# Patient Record
Sex: Male | Born: 1955 | Race: White | Hispanic: No | Marital: Married | Smoking: Former smoker
Health system: Southern US, Community
[De-identification: ages and names within clinical notes are randomized; demographics above are authoritative.]

## PROBLEM LIST (undated history)

## (undated) DIAGNOSIS — E119 Type 2 diabetes mellitus without complications: Secondary | ICD-10-CM

## (undated) DIAGNOSIS — C8190 Hodgkin lymphoma, unspecified, unspecified site: Secondary | ICD-10-CM

## (undated) DIAGNOSIS — M1990 Unspecified osteoarthritis, unspecified site: Secondary | ICD-10-CM

## (undated) DIAGNOSIS — Z87442 Personal history of urinary calculi: Secondary | ICD-10-CM

## (undated) DIAGNOSIS — K581 Irritable bowel syndrome with constipation: Secondary | ICD-10-CM

## (undated) DIAGNOSIS — E785 Hyperlipidemia, unspecified: Secondary | ICD-10-CM

## (undated) DIAGNOSIS — G629 Polyneuropathy, unspecified: Secondary | ICD-10-CM

## (undated) DIAGNOSIS — Z5111 Encounter for antineoplastic chemotherapy: Secondary | ICD-10-CM

## (undated) DIAGNOSIS — J479 Bronchiectasis, uncomplicated: Secondary | ICD-10-CM

## (undated) DIAGNOSIS — R0609 Other forms of dyspnea: Secondary | ICD-10-CM

## (undated) DIAGNOSIS — E669 Obesity, unspecified: Secondary | ICD-10-CM

## (undated) DIAGNOSIS — G4733 Obstructive sleep apnea (adult) (pediatric): Secondary | ICD-10-CM

## (undated) DIAGNOSIS — N400 Enlarged prostate without lower urinary tract symptoms: Secondary | ICD-10-CM

## (undated) DIAGNOSIS — J45909 Unspecified asthma, uncomplicated: Secondary | ICD-10-CM

## (undated) DIAGNOSIS — J3800 Paralysis of vocal cords and larynx, unspecified: Secondary | ICD-10-CM

## (undated) DIAGNOSIS — I10 Essential (primary) hypertension: Secondary | ICD-10-CM

## (undated) DIAGNOSIS — J449 Chronic obstructive pulmonary disease, unspecified: Secondary | ICD-10-CM

## (undated) DIAGNOSIS — R590 Localized enlarged lymph nodes: Secondary | ICD-10-CM

## (undated) DIAGNOSIS — G4730 Sleep apnea, unspecified: Secondary | ICD-10-CM

## (undated) DIAGNOSIS — R0600 Dyspnea, unspecified: Secondary | ICD-10-CM

## (undated) HISTORY — PX: ORCHIECTOMY: SHX2116

## (undated) HISTORY — PX: JOINT REPLACEMENT: SHX530

## (undated) HISTORY — PX: FRACTURE SURGERY: SHX138

## (undated) HISTORY — PX: COLONOSCOPY: SHX174

## (undated) HISTORY — PX: EYE SURGERY: SHX253

## (undated) HISTORY — PX: WRIST SURGERY: SHX841

## (undated) HISTORY — PX: OTHER SURGICAL HISTORY: SHX169

## (undated) HISTORY — PX: TOTAL HIP ARTHROPLASTY: SHX124

---

## 1997-04-03 HISTORY — PX: TRACHEOSTOMY: SUR1362

## 1999-04-04 DIAGNOSIS — C439 Malignant melanoma of skin, unspecified: Secondary | ICD-10-CM

## 1999-04-04 HISTORY — DX: Malignant melanoma of skin, unspecified: C43.9

## 2015-04-04 HISTORY — PX: OTHER SURGICAL HISTORY: SHX169

## 2019-01-30 DIAGNOSIS — C4491 Basal cell carcinoma of skin, unspecified: Secondary | ICD-10-CM

## 2019-01-30 HISTORY — DX: Basal cell carcinoma of skin, unspecified: C44.91

## 2019-07-31 ENCOUNTER — Other Ambulatory Visit: Payer: Self-pay

## 2019-07-31 ENCOUNTER — Ambulatory Visit (INDEPENDENT_AMBULATORY_CARE_PROVIDER_SITE_OTHER): Payer: Medicare Other | Admitting: Dermatology

## 2019-07-31 DIAGNOSIS — L820 Inflamed seborrheic keratosis: Secondary | ICD-10-CM | POA: Diagnosis not present

## 2019-07-31 DIAGNOSIS — D485 Neoplasm of uncertain behavior of skin: Secondary | ICD-10-CM

## 2019-07-31 DIAGNOSIS — L821 Other seborrheic keratosis: Secondary | ICD-10-CM

## 2019-07-31 DIAGNOSIS — D2339 Other benign neoplasm of skin of other parts of face: Secondary | ICD-10-CM

## 2019-07-31 DIAGNOSIS — Z85828 Personal history of other malignant neoplasm of skin: Secondary | ICD-10-CM

## 2019-07-31 DIAGNOSIS — D369 Benign neoplasm, unspecified site: Secondary | ICD-10-CM

## 2019-07-31 DIAGNOSIS — Z85820 Personal history of malignant melanoma of skin: Secondary | ICD-10-CM | POA: Diagnosis not present

## 2019-07-31 DIAGNOSIS — Z1283 Encounter for screening for malignant neoplasm of skin: Secondary | ICD-10-CM

## 2019-07-31 DIAGNOSIS — D1800 Hemangioma unspecified site: Secondary | ICD-10-CM

## 2019-07-31 DIAGNOSIS — D492 Neoplasm of unspecified behavior of bone, soft tissue, and skin: Secondary | ICD-10-CM

## 2019-07-31 DIAGNOSIS — D229 Melanocytic nevi, unspecified: Secondary | ICD-10-CM

## 2019-07-31 DIAGNOSIS — L578 Other skin changes due to chronic exposure to nonionizing radiation: Secondary | ICD-10-CM

## 2019-07-31 NOTE — Patient Instructions (Signed)

## 2019-07-31 NOTE — Progress Notes (Signed)
Follow-Up Visit   Subjective  Austin Sullivan is a 64 y.o. male who presents for the following: Annual Exam (TBSE, 6 months f/u hx of Melanoma, Hx of BCC ). Pt with a hx of Melanoma on the forehead removed in 2001, then pt developed Internal Metastatic Melanoma in 2016, treating with Keytruda since January 2021 every 6 weeks. The patient presents for total-body skin exam and skin cancer screening and mole check.  The following portions of the chart were reviewed this encounter and updated as appropriate:  Allergies  Meds  Problems  Med Hx  Surg Hx  Fam Hx      Review of Systems:  No other skin or systemic complaints except as noted in HPI or Assessment and Plan.  Objective  Well appearing patient in no apparent distress; mood and affect are within normal limits.  A full examination was performed including scalp, head, eyes, ears, nose, lips, neck, chest, axillae, abdomen, back, buttocks, bilateral upper extremities, bilateral lower extremities, hands, feet, fingers, toes, fingernails, and toenails. All findings within normal limits unless otherwise noted below.  Objective  L temple near hairline: Pink patch-   Objective  Left post flank above waistline: Erythematous keratotic or waxy stuck-on papule or plaque.   Objective  L lateral brow: 0.4 cm pink irregular patch   Objective  R inferior pectoral: 0.7 brown papule    Assessment & Plan    Skin cancer screening performed today.  History of Melanoma - No evidence of recurrence today - No lymphadenopathy - Recommend regular full body skin exams - Recommend daily broad spectrum sunscreen SPF 30+ to sun-exposed areas, reapply every 2 hours as needed.  - Call if any new or changing lesions are noted between office visits  -Patient has metastatic melanoma and is currently on chemotherapy Keytruda  Seborrheic Keratoses - Stuck-on, waxy, tan-brown papules and plaques  - Discussed benign etiology and prognosis. -  Observe - Call for any changes  Actinic Damage - diffuse scaly erythematous macules with underlying dyspigmentation - Recommend daily broad spectrum sunscreen SPF 30+ to sun-exposed areas, reapply every 2 hours as needed.  - Call for new or changing lesions.  Melanocytic Nevi - Tan-brown and/or pink-flesh-colored symmetric macules and papules - Benign appearing on exam today - Observation - Call clinic for new or changing moles - Recommend daily use of broad spectrum spf 30+ sunscreen to sun-exposed areas.   History of Basal Cell Carcinoma of the Skin Left ear superior helix 01-30-2019 - No evidence of recurrence today - Recommend regular full body skin exams - Recommend daily broad spectrum sunscreen SPF 30+ to sun-exposed areas, reapply every 2 hours as needed.  - Call if any new or changing lesions are noted between office visits  Hemangiomas - Red papules - Discussed benign nature - Observe - Call for any changes   Benign neoplasm L temple near hairline Pink atrophic patch The patient will observe  Recheck in 3 months   Inflamed seborrheic keratosis Left post flank above waistline  Destruction of lesion - Left post flank above waistline Complexity: simple   Destruction method: cryotherapy   Informed consent: discussed and consent obtained   Timeout:  patient name, date of birth, surgical site, and procedure verified Lesion destroyed using liquid nitrogen: Yes   Region frozen until ice ball extended beyond lesion: Yes   Outcome: patient tolerated procedure well with no complications   Post-procedure details: wound care instructions given    Neoplasm of skin (2) L lateral  brow  Skin / nail biopsy Type of biopsy: tangential   Informed consent: discussed and consent obtained   Patient was prepped and draped in usual sterile fashion: area prepped with alochol. Anesthesia: the lesion was anesthetized in a standard fashion   Anesthetic:  1% lidocaine w/  epinephrine 1-100,000 buffered w/ 8.4% NaHCO3 Instrument used: flexible razor blade   Hemostasis achieved with: pressure, aluminum chloride and electrodesiccation   Outcome: patient tolerated procedure well   Post-procedure details: wound care instructions given   Post-procedure details comment:  Ointment and small bandage  Specimen 1 - Surgical pathology Differential Diagnosis: R/O BCC Check Margins: No 0.4 cm pink irregular patch  R inferior pectoral  Epidermal / dermal shaving  Lesion length (cm):  0.7 Lesion width (cm):  0.7 Margin per side (cm):  0.2 Total excision diameter (cm):  1.1 Informed consent: discussed and consent obtained   Timeout: patient name, date of birth, surgical site, and procedure verified   Procedure prep:  Patient was prepped and draped in usual sterile fashion Prep type:  Isopropyl alcohol Anesthesia: the lesion was anesthetized in a standard fashion   Anesthetic:  1% lidocaine w/ epinephrine 1-100,000 buffered w/ 8.4% NaHCO3 Hemostasis achieved with: pressure, aluminum chloride and electrodesiccation   Outcome: patient tolerated procedure well   Post-procedure details: sterile dressing applied and wound care instructions given   Dressing type: bandage and petrolatum    Specimen 2 - Surgical pathology Differential Diagnosis: SK vs Irritated nevus  Check Margins: No 0.7 brown papule  Return in about 3 months (around 10/30/2019) for recheck ISK, recheck L temple near hairline .  IMarye Round, CMA, am acting as scribe for [name], MD .  Documentation: I have reviewed the above documentation for accuracy and completeness, and I agree with the above.  [name], MD

## 2019-08-01 ENCOUNTER — Encounter: Payer: Self-pay | Admitting: Dermatology

## 2019-08-05 ENCOUNTER — Telehealth: Payer: Self-pay

## 2019-08-05 NOTE — Telephone Encounter (Signed)
LM on VM to return my call (pathology results) 

## 2019-08-05 NOTE — Telephone Encounter (Signed)
Patient advised of BX results. He will keep his 11/03/19 appointment for a three month follow up and to treat the place on his brow.

## 2019-08-06 ENCOUNTER — Telehealth: Payer: Self-pay

## 2019-08-06 NOTE — Telephone Encounter (Signed)
Patient already informed and scheduled for treatment

## 2019-11-03 ENCOUNTER — Ambulatory Visit (INDEPENDENT_AMBULATORY_CARE_PROVIDER_SITE_OTHER): Payer: Medicare Other | Admitting: Dermatology

## 2019-11-03 ENCOUNTER — Other Ambulatory Visit: Payer: Self-pay

## 2019-11-03 ENCOUNTER — Encounter: Payer: Self-pay | Admitting: Dermatology

## 2019-11-03 DIAGNOSIS — L820 Inflamed seborrheic keratosis: Secondary | ICD-10-CM

## 2019-11-03 DIAGNOSIS — C4491 Basal cell carcinoma of skin, unspecified: Secondary | ICD-10-CM

## 2019-11-03 DIAGNOSIS — C44319 Basal cell carcinoma of skin of other parts of face: Secondary | ICD-10-CM | POA: Diagnosis not present

## 2019-11-03 DIAGNOSIS — Z85820 Personal history of malignant melanoma of skin: Secondary | ICD-10-CM

## 2019-11-03 DIAGNOSIS — L578 Other skin changes due to chronic exposure to nonionizing radiation: Secondary | ICD-10-CM

## 2019-11-03 NOTE — Patient Instructions (Signed)

## 2019-11-03 NOTE — Progress Notes (Signed)
   Follow-Up Visit   Subjective  Austin Sullivan is a 64 y.o. male who presents for the following: Basal Cell Carcinoma (left lat brow - EDC today.).  The following portions of the chart were reviewed this encounter and updated as appropriate:  Allergies  Meds  Problems  Med Hx  Surg Hx  Fam Hx     Review of Systems:  No other skin or systemic complaints except as noted in HPI or Assessment and Plan.  Objective  Well appearing patient in no apparent distress; mood and affect are within normal limits.  A focused examination was performed including face, chest. Relevant physical exam findings are noted in the Assessment and Plan.  Objective  Left lat brow: Well healed biopsy site - undetectable today. Recheck on follow up.  Objective  Left medial infrapectoral (2): Erythematous keratotic or waxy stuck-on papule or plaque.    Assessment & Plan  Basal cell carcinoma (BCC), unspecified site Left lat brow  Appears clear today. Advised patient to RTC with any recurrence.  Inflamed seborrheic keratosis (2) Left medial infrapectoral  Destruction of lesion - Left medial infrapectoral Complexity: simple   Destruction method: cryotherapy   Informed consent: discussed and consent obtained   Timeout:  patient name, date of birth, surgical site, and procedure verified Lesion destroyed using liquid nitrogen: Yes   Region frozen until ice ball extended beyond lesion: Yes   Outcome: patient tolerated procedure well with no complications   Post-procedure details: wound care instructions given    Actinic Damage - diffuse scaly erythematous macules with underlying dyspigmentation - Recommend daily broad spectrum sunscreen SPF 30+ to sun-exposed areas, reapply every 2 hours as needed.  - Call for new or changing lesions.  History of Melanoma - metastatic - followed by Dr Harriet Masson at [facility] regular full body skin exams - Recommend daily broad spectrum sunscreen SPF 30+ to  sun-exposed areas, reapply every 2 hours as needed.  - Call if any new or changing lesions are noted between office visits  Return in about 6 months (around 05/05/2020).   I, Ashok Cordia, CMA, am acting as scribe for [name], MD .  Documentation: I have reviewed the above documentation for accuracy and completeness, and I agree with the above.  [name], MD

## 2019-11-04 ENCOUNTER — Encounter: Payer: Self-pay | Admitting: Dermatology

## 2020-04-03 HISTORY — PX: OTHER SURGICAL HISTORY: SHX169

## 2020-04-16 ENCOUNTER — Other Ambulatory Visit: Payer: Self-pay | Admitting: Orthopedic Surgery

## 2020-04-16 DIAGNOSIS — M1712 Unilateral primary osteoarthritis, left knee: Secondary | ICD-10-CM

## 2020-04-27 ENCOUNTER — Ambulatory Visit: Payer: Federal, State, Local not specified - PPO

## 2020-05-12 ENCOUNTER — Encounter: Payer: Self-pay | Admitting: Dermatology

## 2020-05-12 ENCOUNTER — Other Ambulatory Visit: Payer: Self-pay

## 2020-05-12 ENCOUNTER — Ambulatory Visit (INDEPENDENT_AMBULATORY_CARE_PROVIDER_SITE_OTHER): Payer: Medicare Other | Admitting: Dermatology

## 2020-05-12 DIAGNOSIS — Z1283 Encounter for screening for malignant neoplasm of skin: Secondary | ICD-10-CM | POA: Diagnosis not present

## 2020-05-12 DIAGNOSIS — L821 Other seborrheic keratosis: Secondary | ICD-10-CM

## 2020-05-12 DIAGNOSIS — D229 Melanocytic nevi, unspecified: Secondary | ICD-10-CM

## 2020-05-12 DIAGNOSIS — Z85820 Personal history of malignant melanoma of skin: Secondary | ICD-10-CM | POA: Diagnosis not present

## 2020-05-12 DIAGNOSIS — L814 Other melanin hyperpigmentation: Secondary | ICD-10-CM

## 2020-05-12 DIAGNOSIS — L578 Other skin changes due to chronic exposure to nonionizing radiation: Secondary | ICD-10-CM

## 2020-05-12 DIAGNOSIS — D1800 Hemangioma unspecified site: Secondary | ICD-10-CM

## 2020-05-12 DIAGNOSIS — Z85828 Personal history of other malignant neoplasm of skin: Secondary | ICD-10-CM | POA: Diagnosis not present

## 2020-05-12 NOTE — Progress Notes (Signed)
   Follow-Up Visit   Subjective  Austin Sullivan is a 65 y.o. male who presents for the following: Total body skin exam (55m f/u, hx of Melanoma, BCCs). The patient presents for Total-Body Skin Exam (TBSE) for skin cancer screening and mole check.  The following portions of the chart were reviewed this encounter and updated as appropriate:   Allergies  Meds  Problems  Med Hx  Surg Hx  Fam Hx     Review of Systems:  No other skin or systemic complaints except as noted in HPI or Assessment and Plan.  Objective  Well appearing patient in no apparent distress; mood and affect are within normal limits.  A full examination was performed including scalp, head, eyes, ears, nose, lips, neck, chest, axillae, abdomen, back, buttocks, bilateral upper extremities, bilateral lower extremities, hands, feet, fingers, toes, fingernails, and toenails. All findings within normal limits unless otherwise noted below.  Objective  R forehead: Well healed scar with no evidence of recurrence, no lymphadenopathy.   Objective  Left Ear sup helix, L lat brow: Well healed scar with no evidence of recurrence.    Assessment & Plan    Lentigines - Scattered tan macules - Discussed due to sun exposure - Benign, observe - Call for any changes  Seborrheic Keratoses - Stuck-on, waxy, tan-brown papules and plaques  - Discussed benign etiology and prognosis. - Observe - Call for any changes  Melanocytic Nevi - Tan-brown and/or pink-flesh-colored symmetric macules and papules - Benign appearing on exam today - Observation - Call clinic for new or changing moles - Recommend daily use of broad spectrum spf 30+ sunscreen to sun-exposed areas.   Hemangiomas - Red papules - Discussed benign nature - Observe - Call for any changes  Actinic Damage - Chronic, secondary to cumulative UV/sun exposure - diffuse scaly erythematous macules with underlying dyspigmentation - Recommend daily broad spectrum  sunscreen SPF 30+ to sun-exposed areas, reapply every 2 hours as needed.  - Call for new or changing lesions.  Skin cancer screening performed today.  History of melanoma R forehead Metastatic - followed by Dr Harriet Masson at [facility] and here at [facility] [place] [facility]. Hx of Chemotherapy, currently in remission No lymphadenopathy.  History of basal cell carcinoma (BCC) Left Ear sup helix, L lat brow Clear. Observe for recurrence. Call clinic for new or changing lesions.  Recommend regular skin exams, daily broad-spectrum spf 30+ sunscreen use, and photoprotection.     Skin cancer screening  Return in about 6 months (around 11/09/2020) for UBSE, Hx of Melanoma, Hx of BCC.   I, Austin Sullivan, RMA, am acting as scribe for [name], MD .  Documentation: I have reviewed the above documentation for accuracy and completeness, and I agree with the above.  [name], MD

## 2020-05-16 ENCOUNTER — Encounter: Payer: Self-pay | Admitting: Dermatology

## 2020-05-28 ENCOUNTER — Other Ambulatory Visit: Payer: Self-pay

## 2020-05-28 ENCOUNTER — Ambulatory Visit
Admission: RE | Admit: 2020-05-28 | Discharge: 2020-05-28 | Disposition: A | Discharge status: Home / Self Care | Payer: Medicare Other | Source: Ambulatory Visit | Attending: Orthopedic Surgery | Admitting: Orthopedic Surgery

## 2020-05-28 DIAGNOSIS — M1712 Unilateral primary osteoarthritis, left knee: Secondary | ICD-10-CM | POA: Diagnosis not present

## 2020-06-23 ENCOUNTER — Other Ambulatory Visit: Payer: Self-pay | Admitting: Orthopedic Surgery

## 2020-07-02 DIAGNOSIS — A419 Sepsis, unspecified organism: Secondary | ICD-10-CM

## 2020-07-02 HISTORY — DX: Sepsis, unspecified organism: A41.9

## 2020-07-09 ENCOUNTER — Encounter
Admission: RE | Admit: 2020-07-09 | Discharge: 2020-07-09 | Disposition: A | Discharge status: Home / Self Care | Payer: Medicare Other | Source: Ambulatory Visit | Attending: Orthopedic Surgery | Admitting: Orthopedic Surgery

## 2020-07-09 ENCOUNTER — Other Ambulatory Visit: Payer: Self-pay

## 2020-07-09 DIAGNOSIS — Z01818 Encounter for other preprocedural examination: Secondary | ICD-10-CM | POA: Insufficient documentation

## 2020-07-09 HISTORY — DX: Dyspnea, unspecified: R06.00

## 2020-07-09 HISTORY — DX: Chronic obstructive pulmonary disease, unspecified: J44.9

## 2020-07-09 HISTORY — DX: Sleep apnea, unspecified: G47.30

## 2020-07-09 HISTORY — DX: Essential (primary) hypertension: I10

## 2020-07-09 HISTORY — DX: Type 2 diabetes mellitus without complications: E11.9

## 2020-07-09 HISTORY — DX: Unspecified osteoarthritis, unspecified site: M19.90

## 2020-07-09 LAB — URINALYSIS, ROUTINE W REFLEX MICROSCOPIC
Bacteria, UA: NONE SEEN
Bilirubin Urine: NEGATIVE
Glucose, UA: NEGATIVE mg/dL
Ketones, ur: NEGATIVE mg/dL
Nitrite: NEGATIVE
Protein, ur: 100 mg/dL — AB
RBC / HPF: >50 RBC/hpf — ABNORMAL HIGH (ref 0–5)
Specific Gravity, Urine: 1.019 (ref 1.005–1.030)
Squamous Epithelial / HPF: NONE SEEN (ref 0–5)
WBC, UA: >50 WBC/hpf — ABNORMAL HIGH (ref 0–5)
pH: 5 (ref 5.0–8.0)

## 2020-07-09 LAB — CBC WITH DIFFERENTIAL/PLATELET
Abs Immature Granulocytes: 0.04 10*3/uL (ref 0.00–0.07)
Basophils Absolute: 0.1 10*3/uL (ref 0.0–0.1)
Basophils Relative: 1 %
Eosinophils Absolute: 0.2 10*3/uL (ref 0.0–0.5)
Eosinophils Relative: 3 %
HCT: 39.4 % (ref 39.0–52.0)
Hemoglobin: 12.7 g/dL — ABNORMAL LOW (ref 13.0–17.0)
Immature Granulocytes: 1 %
Lymphocytes Relative: 8 %
Lymphs Abs: 0.6 10*3/uL — ABNORMAL LOW (ref 0.7–4.0)
MCH: 26.7 pg (ref 26.0–34.0)
MCHC: 32.2 g/dL (ref 30.0–36.0)
MCV: 82.8 fL (ref 80.0–100.0)
Monocytes Absolute: 0.5 10*3/uL (ref 0.1–1.0)
Monocytes Relative: 8 %
Neutro Abs: 5.7 10*3/uL (ref 1.7–7.7)
Neutrophils Relative %: 79 %
Platelets: 278 10*3/uL (ref 150–400)
RBC: 4.76 MIL/uL (ref 4.22–5.81)
RDW: 17.8 % — ABNORMAL HIGH (ref 11.5–15.5)
WBC: 7.1 10*3/uL (ref 4.0–10.5)
nRBC: 0 % (ref 0.0–0.2)

## 2020-07-09 LAB — COMPREHENSIVE METABOLIC PANEL
ALT: 50 U/L — ABNORMAL HIGH (ref 0–44)
AST: 37 U/L (ref 15–41)
Albumin: 4.2 g/dL (ref 3.5–5.0)
Alkaline Phosphatase: 63 U/L (ref 38–126)
Anion gap: 10 (ref 5–15)
BUN: 26 mg/dL — ABNORMAL HIGH (ref 8–23)
CO2: 27 mmol/L (ref 22–32)
Calcium: 9.5 mg/dL (ref 8.9–10.3)
Chloride: 100 mmol/L (ref 98–111)
Creatinine, Ser: 1.34 mg/dL — ABNORMAL HIGH (ref 0.61–1.24)
GFR, Estimated: 58 mL/min — ABNORMAL LOW (ref >60)
Glucose, Bld: 149 mg/dL — ABNORMAL HIGH (ref 70–99)
Potassium: 3.7 mmol/L (ref 3.5–5.1)
Sodium: 137 mmol/L (ref 135–145)
Total Bilirubin: 0.6 mg/dL (ref 0.3–1.2)
Total Protein: 7.3 g/dL (ref 6.5–8.1)

## 2020-07-09 LAB — SURGICAL PCR SCREEN
MRSA, PCR: NEGATIVE
Staphylococcus aureus: NEGATIVE

## 2020-07-09 LAB — TYPE AND SCREEN
ABO/RH(D): O NEG
Antibody Screen: NEGATIVE

## 2020-07-09 NOTE — Patient Instructions (Addendum)
Your procedure is scheduled on: 07/20/20- Tuesday Report to the Registration Desk on the 1st floor of the [facility]. To find out your arrival time, please call [phone] between 1PM - 3PM on: 07/19/20- Monday Report to [facility] for Covid testing on 07/16/20 between 8 am - 2 pm.  REMEMBER: Instructions that are not followed completely may result in serious medical risk, up to and including death; or upon the discretion of your surgeon and anesthesiologist your surgery may need to be rescheduled.  Do not eat food after midnight the night before surgery.  No gum chewing, lozengers or hard candies.  You may however, drink CLEAR liquids up to 2 hours before you are scheduled to arrive for your surgery. Do not drink anything within 2 hours of your scheduled arrival time.  Type 1 and Type 2 diabetics should only drink water.  TAKE THESE MEDICATIONS THE MORNING OF SURGERY WITH A SIP OF WATER:  - alfuzosin (UROXATRAL) 10 MG 24 hr tablet - Azelastine HCl 137 MCG/SPRAY SOLN - cycloSPORINE (RESTASIS) 0.05 % ophthalmic emulsion - fluticasone (FLONASE) 50 MCG/ACT nasal spray - fluticasone furoate-vilanterol (BREO ELLIPTA) 100-25 MCG/INH AEPB - ipratropium (ATROVENT) 0.06 % nasal spray - pregabalin (LYRICA) 50 MG capsule - metoprolol tartrate (LOPRESSOR) 25 MG tablet  Use inhalers on the day of surgery and bring to the hospital.  Stop Metformin 2 days prior to surgery. Do not take 04/17, 04/18, and do not take the morning of surgery.   One week prior to surgery: Stop starting 07/12/20 Stop Anti-inflammatories (NSAIDS) such as Advil, Aleve, Ibuprofen, Motrin, Naproxen, Naprosyn and Aspirin based products such as Excedrin, Goodys Powder, BC Powder.  Stop taking beginning 07/12/20 ANY OVER THE COUNTER supplements until after surgery.Omega-3 Fatty Acids (FISH OIL) 1000 MG CAPS, Coenzyme Q10 100 MG capsule.  No Alcohol for 24 hours before or after surgery.  No Smoking including  e-cigarettes for 24 hours prior to surgery.  No chewable tobacco products for at least 6 hours prior to surgery.  No nicotine patches on the day of surgery.  Do not use any "recreational" drugs for at least a week prior to your surgery.  Please be advised that the combination of cocaine and anesthesia may have negative outcomes, up to and including death. If you test positive for cocaine, your surgery will be cancelled.  On the morning of surgery brush your teeth with toothpaste and water, you may rinse your mouth with mouthwash if you wish. Do not swallow any toothpaste or mouthwash.  Do not wear jewelry, make-up, hairpins, clips or nail polish.  Do not wear lotions, powders, or perfumes.   Do not shave body from the neck down 48 hours prior to surgery just in case you cut yourself which could leave a site for infection.  Also, freshly shaved skin may become irritated if using the CHG soap.  Contact lenses, hearing aids and dentures may not be worn into surgery.  Do not bring valuables to the hospital. [facility] is not responsible for any missing/lost belongings or valuables.   Use CHG Soap or wipes as directed on instruction sheet.  Bring your C-PAP to the hospital with you in case you may have to spend the night.   Notify your doctor if there is any change in your medical condition (cold, fever, infection).  Wear comfortable clothing (specific to your surgery type) to the hospital.  Plan for stool softeners for home use; pain medications have a tendency to cause constipation. You can  also help prevent constipation by eating foods high in fiber such as fruits and vegetables and drinking plenty of fluids as your diet allows.  After surgery, you can help prevent lung complications by doing breathing exercises.  Take deep breaths and cough every 1-2 hours. Your doctor may order a device called an Incentive Spirometer to help you take deep breaths. When coughing or sneezing, hold a  pillow firmly against your incision with both hands. This is called "splinting." Doing this helps protect your incision. It also decreases belly discomfort.  If you are being admitted to the hospital overnight, leave your suitcase in the car. After surgery it may be brought to your room.  If you are being discharged the day of surgery, you will not be allowed to drive home. You will need a responsible adult (18 years or older) to drive you home and stay with you that night.   If you are taking public transportation, you will need to have a responsible adult (18 years or older) with you. Please confirm with your physician that it is acceptable to use public transportation.   Please call the [facility] at [phone] if you have any questions about these instructions.  Surgery Visitation Policy:  Patients undergoing a surgery or procedure may have one family member or support person with them as long as that person is not COVID-19 positive or experiencing its symptoms.  That person may remain in the waiting area during the procedure.  Inpatient Visitation:    Visiting hours are 7 a.m. to 8 p.m. Inpatients will be allowed two visitors daily. The visitors may change each day during the patient's stay. No visitors under the age of 40. Any visitor under the age of 79 must be accompanied by an adult. The visitor must pass COVID-19 screenings, use hand sanitizer when entering and exiting the patient's room and wear a mask at all times, including in the patient's room. Patients must also wear a mask when staff or their visitor are in the room. Masking is required regardless of vaccination status.

## 2020-07-10 NOTE — Progress Notes (Signed)
  [facility] Perioperative Services: Pre-Admission/Anesthesia Testing  Abnormal Lab Notification   Date: 07/10/20  Name: Austin Sullivan MRN:   [id number]  Re: Abnormal labs noted during PAT appointment   Provider(s) Notified: No att. providers found Notification mode: Routed and/or faxed via [facility] LAB VALUE(S): Lab Results  Component Value Date   COLORURINE YELLOW (A) 07/09/2020   APPEARANCEUR CLOUDY (A) 07/09/2020   LABSPEC 1.019 07/09/2020   PHURINE 5.0 07/09/2020   GLUCOSEU NEGATIVE 07/09/2020   HGBUR LARGE (A) 07/09/2020   BILIRUBINUR NEGATIVE 07/09/2020   KETONESUR NEGATIVE 07/09/2020   PROTEINUR 100 (A) 07/09/2020   NITRITE NEGATIVE 07/09/2020   LEUKOCYTESUR SMALL (A) 07/09/2020   EPIU NONE SEEN 07/09/2020   WBCU >50 (H) 07/09/2020   RBCU >50 (H) 07/09/2020   BACTERIA NONE SEEN 07/09/2020   Notes: Patient scheduled for TOTAL KNEE ARTHROPLASTY - [name] to Assist (Left Knee) on 07/20/2020.    UA performed in PAT concerning for infection (+ LE, >50 WBC/hpf, and WBC clumps) . No leukocytosis noted on CBC; WBC 7.1 K/uL.  Marland Kitchen Renal function slightly elevated; BUN 26 and creatinine 1.34  . Urine C&S added to assess for pathogenically significant growth.  Will forward UA, and subsequent C&S results, to attending surgeon for review and Tx as deemed appropriate.This is a Community education officer; no formal response is required.  Honor Loh, MSN, APRN, FNP-C, CEN [facility]  Peri-operative Services Nurse Practitioner Phone: [phone] Fax: [phone]

## 2020-07-11 LAB — URINE CULTURE: Culture: 10000 — AB

## 2020-07-16 ENCOUNTER — Other Ambulatory Visit
Admission: RE | Admit: 2020-07-16 | Discharge: 2020-07-16 | Disposition: A | Discharge status: Home / Self Care | Payer: Medicare Other | Source: Ambulatory Visit | Attending: Orthopedic Surgery | Admitting: Orthopedic Surgery

## 2020-07-16 ENCOUNTER — Other Ambulatory Visit: Payer: Self-pay

## 2020-07-16 DIAGNOSIS — Z20822 Contact with and (suspected) exposure to covid-19: Secondary | ICD-10-CM | POA: Insufficient documentation

## 2020-07-16 DIAGNOSIS — Z01812 Encounter for preprocedural laboratory examination: Secondary | ICD-10-CM | POA: Diagnosis present

## 2020-07-16 LAB — SARS CORONAVIRUS 2 (TAT 6-24 HRS): SARS Coronavirus 2: NEGATIVE

## 2020-07-20 ENCOUNTER — Inpatient Hospital Stay: Payer: Medicare Other

## 2020-07-20 ENCOUNTER — Inpatient Hospital Stay
Admission: RE | Admit: 2020-07-20 | Discharge: 2020-07-23 | DRG: 470 | Disposition: A | Discharge status: Home / Self Care | Payer: Medicare Other | Attending: Orthopedic Surgery | Admitting: Orthopedic Surgery

## 2020-07-20 ENCOUNTER — Inpatient Hospital Stay: Payer: Medicare Other | Admitting: Urgent Care

## 2020-07-20 ENCOUNTER — Encounter
Admission: RE | Disposition: A | Discharge status: Home / Self Care | Payer: Self-pay | Source: Home / Self Care | Attending: Orthopedic Surgery

## 2020-07-20 ENCOUNTER — Other Ambulatory Visit: Payer: Self-pay

## 2020-07-20 ENCOUNTER — Encounter: Payer: Self-pay | Admitting: Orthopedic Surgery

## 2020-07-20 DIAGNOSIS — Z7984 Long term (current) use of oral hypoglycemic drugs: Secondary | ICD-10-CM

## 2020-07-20 DIAGNOSIS — G8918 Other acute postprocedural pain: Secondary | ICD-10-CM

## 2020-07-20 DIAGNOSIS — M1712 Unilateral primary osteoarthritis, left knee: Principal | ICD-10-CM | POA: Diagnosis present

## 2020-07-20 DIAGNOSIS — Z85820 Personal history of malignant melanoma of skin: Secondary | ICD-10-CM

## 2020-07-20 DIAGNOSIS — Z888 Allergy status to other drugs, medicaments and biological substances status: Secondary | ICD-10-CM | POA: Diagnosis not present

## 2020-07-20 DIAGNOSIS — G4730 Sleep apnea, unspecified: Secondary | ICD-10-CM | POA: Diagnosis present

## 2020-07-20 DIAGNOSIS — I959 Hypotension, unspecified: Secondary | ICD-10-CM | POA: Diagnosis not present

## 2020-07-20 DIAGNOSIS — Z833 Family history of diabetes mellitus: Secondary | ICD-10-CM | POA: Diagnosis not present

## 2020-07-20 DIAGNOSIS — E785 Hyperlipidemia, unspecified: Secondary | ICD-10-CM | POA: Diagnosis present

## 2020-07-20 DIAGNOSIS — Z96652 Presence of left artificial knee joint: Secondary | ICD-10-CM

## 2020-07-20 DIAGNOSIS — I10 Essential (primary) hypertension: Secondary | ICD-10-CM | POA: Diagnosis present

## 2020-07-20 DIAGNOSIS — E876 Hypokalemia: Secondary | ICD-10-CM | POA: Diagnosis not present

## 2020-07-20 DIAGNOSIS — M21062 Valgus deformity, not elsewhere classified, left knee: Secondary | ICD-10-CM | POA: Diagnosis present

## 2020-07-20 DIAGNOSIS — Z91030 Bee allergy status: Secondary | ICD-10-CM

## 2020-07-20 DIAGNOSIS — J449 Chronic obstructive pulmonary disease, unspecified: Secondary | ICD-10-CM | POA: Diagnosis present

## 2020-07-20 DIAGNOSIS — Z79899 Other long term (current) drug therapy: Secondary | ICD-10-CM

## 2020-07-20 DIAGNOSIS — Z825 Family history of asthma and other chronic lower respiratory diseases: Secondary | ICD-10-CM

## 2020-07-20 DIAGNOSIS — E119 Type 2 diabetes mellitus without complications: Secondary | ICD-10-CM | POA: Diagnosis present

## 2020-07-20 DIAGNOSIS — Z8249 Family history of ischemic heart disease and other diseases of the circulatory system: Secondary | ICD-10-CM | POA: Diagnosis not present

## 2020-07-20 HISTORY — PX: TOTAL KNEE ARTHROPLASTY: SHX125

## 2020-07-20 LAB — CBC
HCT: 36.7 % — ABNORMAL LOW (ref 39.0–52.0)
Hemoglobin: 11.9 g/dL — ABNORMAL LOW (ref 13.0–17.0)
MCH: 26.8 pg (ref 26.0–34.0)
MCHC: 32.4 g/dL (ref 30.0–36.0)
MCV: 82.7 fL (ref 80.0–100.0)
Platelets: 232 10*3/uL (ref 150–400)
RBC: 4.44 MIL/uL (ref 4.22–5.81)
RDW: 17.3 % — ABNORMAL HIGH (ref 11.5–15.5)
WBC: 10.1 10*3/uL (ref 4.0–10.5)
nRBC: 0 % (ref 0.0–0.2)

## 2020-07-20 LAB — POCT I-STAT, CHEM 8
BUN: 27 mg/dL — ABNORMAL HIGH (ref 8–23)
Calcium, Ion: 1.2 mmol/L (ref 1.15–1.40)
Chloride: 101 mmol/L (ref 98–111)
Creatinine, Ser: 1.3 mg/dL — ABNORMAL HIGH (ref 0.61–1.24)
Glucose, Bld: 190 mg/dL — ABNORMAL HIGH (ref 70–99)
HCT: 39 % (ref 39.0–52.0)
Hemoglobin: 13.3 g/dL (ref 13.0–17.0)
Potassium: 3.3 mmol/L — ABNORMAL LOW (ref 3.5–5.1)
Sodium: 139 mmol/L (ref 135–145)
TCO2: 25 mmol/L (ref 22–32)

## 2020-07-20 LAB — CREATININE, SERUM
Creatinine, Ser: 1.28 mg/dL — ABNORMAL HIGH (ref 0.61–1.24)
GFR, Estimated: >60 mL/min (ref >60)

## 2020-07-20 LAB — GLUCOSE, CAPILLARY
Glucose-Capillary: 175 mg/dL — ABNORMAL HIGH (ref 70–99)
Glucose-Capillary: 205 mg/dL — ABNORMAL HIGH (ref 70–99)
Glucose-Capillary: 238 mg/dL — ABNORMAL HIGH (ref 70–99)

## 2020-07-20 LAB — ABO/RH: ABO/RH(D): O NEG

## 2020-07-20 SURGERY — ARTHROPLASTY, KNEE, TOTAL
Anesthesia: Choice | Site: Knee | Laterality: Left

## 2020-07-20 MED ORDER — ROSUVASTATIN CALCIUM 10 MG PO TABS
40.0000 mg | ORAL_TABLET | Freq: Every day | ORAL | Status: DC
  Administered 2020-07-21 – 2020-07-23 (×3): 40 mg via ORAL
  Filled 2020-07-20 (×3): qty 4

## 2020-07-20 MED ORDER — LIDOCAINE-PRILOCAINE 2.5-2.5 % EX CREA
1.0000 "application " | TOPICAL_CREAM | Freq: Every day | CUTANEOUS | Status: DC | PRN
  Filled 2020-07-20: qty 5

## 2020-07-20 MED ORDER — MAGNESIUM CITRATE PO SOLN
1.0000 | Freq: Once | ORAL | Status: DC | PRN
  Filled 2020-07-20 (×2): qty 296

## 2020-07-20 MED ORDER — SURGIPHOR WOUND IRRIGATION SYSTEM - OPTIME
TOPICAL | Status: DC | PRN
  Administered 2020-07-20: 400 mL via TOPICAL

## 2020-07-20 MED ORDER — RAMIPRIL 10 MG PO CAPS
20.0000 mg | ORAL_CAPSULE | Freq: Every day | ORAL | Status: DC
  Administered 2020-07-22 – 2020-07-23 (×2): 20 mg via ORAL
  Filled 2020-07-20 (×4): qty 2

## 2020-07-20 MED ORDER — PROPOFOL 10 MG/ML IV BOLUS
INTRAVENOUS | Status: AC
  Filled 2020-07-20: qty 20

## 2020-07-20 MED ORDER — ONDANSETRON HCL 4 MG/2ML IJ SOLN
INTRAMUSCULAR | Status: DC | PRN
  Administered 2020-07-20: 4 mg via INTRAVENOUS

## 2020-07-20 MED ORDER — SODIUM CHLORIDE 0.9 % IV SOLN
INTRAVENOUS | Status: DC | PRN
  Administered 2020-07-20: 60 mL

## 2020-07-20 MED ORDER — DIPHENHYDRAMINE HCL 12.5 MG/5ML PO ELIX: 12.5000 mg | ORAL_SOLUTION | ORAL | Status: DC | PRN

## 2020-07-20 MED ORDER — ACETAMINOPHEN 500 MG PO TABS
1000.0000 mg | ORAL_TABLET | Freq: Four times a day (QID) | ORAL | Status: AC
  Administered 2020-07-20 – 2020-07-21 (×4): 1000 mg via ORAL
  Filled 2020-07-20 (×4): qty 2

## 2020-07-20 MED ORDER — CLINDAMYCIN PHOSPHATE 900 MG/50ML IV SOLN
900.0000 mg | Freq: Four times a day (QID) | INTRAVENOUS | Status: AC
  Administered 2020-07-20 – 2020-07-21 (×2): 900 mg via INTRAVENOUS
  Filled 2020-07-20 (×3): qty 50

## 2020-07-20 MED ORDER — OXYCODONE HCL 5 MG/5ML PO SOLN
5.0000 mg | Freq: Once | ORAL | Status: DC | PRN
Start: 2020-07-20 — End: 2020-07-20

## 2020-07-20 MED ORDER — CHLORHEXIDINE GLUCONATE 0.12 % MT SOLN: 15.0000 mL | Freq: Once | OROMUCOSAL | Status: AC

## 2020-07-20 MED ORDER — FENTANYL CITRATE (PF) 100 MCG/2ML IJ SOLN: 25.0000 ug | INTRAMUSCULAR | Status: DC | PRN

## 2020-07-20 MED ORDER — ZOLPIDEM TARTRATE 5 MG PO TABS: 5.0000 mg | ORAL_TABLET | Freq: Every evening | ORAL | Status: DC | PRN

## 2020-07-20 MED ORDER — TRAMADOL HCL 50 MG PO TABS: 50.0000 mg | ORAL_TABLET | Freq: Four times a day (QID) | ORAL | 0 refills | Status: DC | PRN

## 2020-07-20 MED ORDER — MONTELUKAST SODIUM 10 MG PO TABS
10.0000 mg | ORAL_TABLET | Freq: Every day | ORAL | Status: DC
  Administered 2020-07-20 – 2020-07-22 (×3): 10 mg via ORAL
  Filled 2020-07-20 (×3): qty 1

## 2020-07-20 MED ORDER — PROPOFOL 500 MG/50ML IV EMUL
INTRAVENOUS | Status: DC | PRN
  Administered 2020-07-20: 125 ug/kg/min via INTRAVENOUS

## 2020-07-20 MED ORDER — MENTHOL 3 MG MT LOZG
1.0000 | LOZENGE | OROMUCOSAL | Status: DC | PRN
  Filled 2020-07-20: qty 9

## 2020-07-20 MED ORDER — ONDANSETRON HCL 4 MG/2ML IJ SOLN
4.0000 mg | Freq: Four times a day (QID) | INTRAMUSCULAR | Status: DC | PRN
  Administered 2020-07-21 (×2): 4 mg via INTRAVENOUS
  Filled 2020-07-20 (×2): qty 2

## 2020-07-20 MED ORDER — LINACLOTIDE 145 MCG PO CAPS
145.0000 ug | ORAL_CAPSULE | Freq: Every day | ORAL | Status: DC
  Administered 2020-07-21 – 2020-07-23 (×3): 145 ug via ORAL
  Filled 2020-07-20 (×3): qty 1

## 2020-07-20 MED ORDER — BUPIVACAINE HCL (PF) 0.5 % IJ SOLN
INTRAMUSCULAR | Status: DC | PRN
  Administered 2020-07-20: 2.5 mL

## 2020-07-20 MED ORDER — CLINDAMYCIN PHOSPHATE 900 MG/50ML IV SOLN
900.0000 mg | INTRAVENOUS | Status: AC
  Administered 2020-07-20: 900 mg via INTRAVENOUS

## 2020-07-20 MED ORDER — PHENYLEPHRINE HCL (PRESSORS) 10 MG/ML IV SOLN
INTRAVENOUS | Status: DC | PRN
  Administered 2020-07-20: 200 ug via INTRAVENOUS
  Administered 2020-07-20 (×2): 100 ug via INTRAVENOUS
  Administered 2020-07-20: 200 ug via INTRAVENOUS

## 2020-07-20 MED ORDER — ONDANSETRON HCL 4 MG/2ML IJ SOLN
INTRAMUSCULAR | Status: AC
  Filled 2020-07-20: qty 2

## 2020-07-20 MED ORDER — CLINDAMYCIN PHOSPHATE 900 MG/50ML IV SOLN
INTRAVENOUS | Status: AC
  Administered 2020-07-20: 900 mg via INTRAVENOUS
  Filled 2020-07-20: qty 50

## 2020-07-20 MED ORDER — MORPHINE SULFATE (PF) 10 MG/ML IV SOLN
INTRAVENOUS | Status: DC | PRN
  Administered 2020-07-20: 10 mg

## 2020-07-20 MED ORDER — IPRATROPIUM BROMIDE 0.06 % NA SOLN
2.0000 | Freq: Two times a day (BID) | NASAL | Status: DC
  Administered 2020-07-21 – 2020-07-23 (×4): 2 via NASAL
  Filled 2020-07-20 (×2): qty 15

## 2020-07-20 MED ORDER — ACETAMINOPHEN 325 MG PO TABS
325.0000 mg | ORAL_TABLET | Freq: Four times a day (QID) | ORAL | Status: DC | PRN
Start: 2020-07-21 — End: 2020-07-23
  Administered 2020-07-23: 650 mg via ORAL
  Filled 2020-07-20: qty 2

## 2020-07-20 MED ORDER — ONDANSETRON HCL 4 MG PO TABS
4.0000 mg | ORAL_TABLET | Freq: Four times a day (QID) | ORAL | Status: DC | PRN
  Administered 2020-07-20: 4 mg via ORAL
  Filled 2020-07-20: qty 1

## 2020-07-20 MED ORDER — METHOCARBAMOL 500 MG PO TABS
500.0000 mg | ORAL_TABLET | Freq: Four times a day (QID) | ORAL | Status: DC | PRN
  Administered 2020-07-20 – 2020-07-21 (×2): 500 mg via ORAL
  Filled 2020-07-20 (×2): qty 1

## 2020-07-20 MED ORDER — OXYCODONE HCL 5 MG PO TABS: 5.0000 mg | ORAL_TABLET | Freq: Once | ORAL | Status: DC | PRN

## 2020-07-20 MED ORDER — ALUM & MAG HYDROXIDE-SIMETH 200-200-20 MG/5ML PO SUSP
30.0000 mL | ORAL | Status: DC | PRN
  Administered 2020-07-21: 30 mL via ORAL
  Filled 2020-07-20: qty 30

## 2020-07-20 MED ORDER — FLUTICASONE FUROATE-VILANTEROL 100-25 MCG/INH IN AEPB
1.0000 | INHALATION_SPRAY | Freq: Every day | RESPIRATORY_TRACT | Status: DC
  Administered 2020-07-21 – 2020-07-23 (×3): 1 via RESPIRATORY_TRACT
  Filled 2020-07-20: qty 28

## 2020-07-20 MED ORDER — OXYCODONE HCL 5 MG PO TABS: 5.0000 mg | ORAL_TABLET | ORAL | 0 refills | Status: DC | PRN

## 2020-07-20 MED ORDER — NEOMYCIN-POLYMYXIN B GU 40-200000 IR SOLN
Status: DC | PRN
  Administered 2020-07-20: 14 mL

## 2020-07-20 MED ORDER — PREGABALIN 50 MG PO CAPS
50.0000 mg | ORAL_CAPSULE | Freq: Three times a day (TID) | ORAL | Status: DC
  Administered 2020-07-20 – 2020-07-23 (×5): 50 mg via ORAL
  Filled 2020-07-20 (×6): qty 1

## 2020-07-20 MED ORDER — TRANEXAMIC ACID 1000 MG/10ML IV SOLN
INTRAVENOUS | Status: AC
  Filled 2020-07-20: qty 10

## 2020-07-20 MED ORDER — ACETAMINOPHEN 10 MG/ML IV SOLN
INTRAVENOUS | Status: AC
  Filled 2020-07-20: qty 300

## 2020-07-20 MED ORDER — BUPIVACAINE LIPOSOME 1.3 % IJ SUSP
INTRAMUSCULAR | Status: AC
  Filled 2020-07-20: qty 20

## 2020-07-20 MED ORDER — METHOCARBAMOL 1000 MG/10ML IJ SOLN
500.0000 mg | Freq: Four times a day (QID) | INTRAVENOUS | Status: DC | PRN
  Filled 2020-07-20: qty 5

## 2020-07-20 MED ORDER — METOPROLOL TARTRATE 25 MG PO TABS
25.0000 mg | ORAL_TABLET | Freq: Every day | ORAL | Status: DC
  Administered 2020-07-22 – 2020-07-23 (×2): 25 mg via ORAL
  Filled 2020-07-20 (×3): qty 1

## 2020-07-20 MED ORDER — METOCLOPRAMIDE HCL 5 MG/ML IJ SOLN
5.0000 mg | Freq: Three times a day (TID) | INTRAMUSCULAR | Status: DC | PRN
Start: 2020-07-20 — End: 2020-07-23
  Administered 2020-07-21 – 2020-07-22 (×3): 10 mg via INTRAVENOUS
  Filled 2020-07-20 (×3): qty 2

## 2020-07-20 MED ORDER — PROPOFOL 1000 MG/100ML IV EMUL
INTRAVENOUS | Status: AC
  Filled 2020-07-20: qty 100

## 2020-07-20 MED ORDER — INSULIN ASPART 100 UNIT/ML ~~LOC~~ SOLN
0.0000 [IU] | Freq: Three times a day (TID) | SUBCUTANEOUS | Status: DC
  Administered 2020-07-21: 8 [IU] via SUBCUTANEOUS
  Administered 2020-07-21 (×2): 5 [IU] via SUBCUTANEOUS
  Administered 2020-07-22: 2 [IU] via SUBCUTANEOUS
  Administered 2020-07-22: 5 [IU] via SUBCUTANEOUS
  Administered 2020-07-23 (×2): 3 [IU] via SUBCUTANEOUS
  Filled 2020-07-20 (×9): qty 1

## 2020-07-20 MED ORDER — ENOXAPARIN SODIUM 40 MG/0.4ML ~~LOC~~ SOLN: 40.0000 mg | SUBCUTANEOUS | 0 refills | Status: DC

## 2020-07-20 MED ORDER — BUPIVACAINE-EPINEPHRINE (PF) 0.25% -1:200000 IJ SOLN
INTRAMUSCULAR | Status: AC
  Filled 2020-07-20: qty 30

## 2020-07-20 MED ORDER — CHLORTHALIDONE 25 MG PO TABS
25.0000 mg | ORAL_TABLET | Freq: Every day | ORAL | Status: DC
  Administered 2020-07-22 – 2020-07-23 (×2): 25 mg via ORAL
  Filled 2020-07-20 (×4): qty 1

## 2020-07-20 MED ORDER — CHLORHEXIDINE GLUCONATE 0.12 % MT SOLN
OROMUCOSAL | Status: AC
  Administered 2020-07-20: 15 mL via OROMUCOSAL
  Filled 2020-07-20: qty 15

## 2020-07-20 MED ORDER — BUPIVACAINE-EPINEPHRINE (PF) 0.25% -1:200000 IJ SOLN
INTRAMUSCULAR | Status: DC | PRN
  Administered 2020-07-20: 30 mL

## 2020-07-20 MED ORDER — FAMOTIDINE 20 MG PO TABS
ORAL_TABLET | ORAL | Status: AC
  Administered 2020-07-20: 20 mg
  Filled 2020-07-20: qty 1

## 2020-07-20 MED ORDER — ACETAMINOPHEN 10 MG/ML IV SOLN: 1000.0000 mg | Freq: Once | INTRAVENOUS | Status: DC | PRN

## 2020-07-20 MED ORDER — PANTOPRAZOLE SODIUM 40 MG PO TBEC
40.0000 mg | DELAYED_RELEASE_TABLET | Freq: Every day | ORAL | Status: DC
  Administered 2020-07-20 – 2020-07-23 (×4): 40 mg via ORAL
  Filled 2020-07-20 (×4): qty 1

## 2020-07-20 MED ORDER — MIDAZOLAM HCL 5 MG/5ML IJ SOLN
INTRAMUSCULAR | Status: DC | PRN
  Administered 2020-07-20 (×2): 1 mg via INTRAVENOUS

## 2020-07-20 MED ORDER — ACETAMINOPHEN 10 MG/ML IV SOLN
INTRAVENOUS | Status: DC | PRN
  Administered 2020-07-20: 1000 mg via INTRAVENOUS

## 2020-07-20 MED ORDER — MORPHINE SULFATE (PF) 10 MG/ML IV SOLN
INTRAVENOUS | Status: AC
  Filled 2020-07-20: qty 1

## 2020-07-20 MED ORDER — METOCLOPRAMIDE HCL 10 MG PO TABS
5.0000 mg | ORAL_TABLET | Freq: Three times a day (TID) | ORAL | Status: DC | PRN
  Administered 2020-07-21: 10 mg via ORAL
  Filled 2020-07-20 (×2): qty 1

## 2020-07-20 MED ORDER — SODIUM CHLORIDE FLUSH 0.9 % IV SOLN
INTRAVENOUS | Status: AC
  Filled 2020-07-20: qty 40

## 2020-07-20 MED ORDER — OXYCODONE HCL 5 MG PO TABS
5.0000 mg | ORAL_TABLET | ORAL | Status: DC | PRN
Start: 2020-07-20 — End: 2020-07-23
  Administered 2020-07-20 – 2020-07-21 (×2): 5 mg via ORAL
  Filled 2020-07-20: qty 1

## 2020-07-20 MED ORDER — BISACODYL 10 MG RE SUPP
10.0000 mg | Freq: Every day | RECTAL | Status: DC | PRN
  Filled 2020-07-20 (×2): qty 1

## 2020-07-20 MED ORDER — TRAMADOL HCL 50 MG PO TABS
50.0000 mg | ORAL_TABLET | Freq: Four times a day (QID) | ORAL | Status: DC
Start: 2020-07-20 — End: 2020-07-23
  Administered 2020-07-20 – 2020-07-23 (×8): 50 mg via ORAL
  Filled 2020-07-20 (×11): qty 1

## 2020-07-20 MED ORDER — CYCLOSPORINE 0.05 % OP EMUL
1.0000 [drp] | Freq: Two times a day (BID) | OPHTHALMIC | Status: DC
  Administered 2020-07-20 – 2020-07-23 (×6): 1 [drp] via OPHTHALMIC
  Filled 2020-07-20 (×7): qty 1

## 2020-07-20 MED ORDER — METFORMIN HCL 500 MG PO TABS
1000.0000 mg | ORAL_TABLET | Freq: Two times a day (BID) | ORAL | Status: DC
  Administered 2020-07-21 – 2020-07-23 (×4): 1000 mg via ORAL
  Filled 2020-07-20 (×5): qty 2

## 2020-07-20 MED ORDER — SODIUM CHLORIDE 0.9 % IV SOLN: INTRAVENOUS | Status: DC

## 2020-07-20 MED ORDER — PREGABALIN 50 MG PO CAPS
100.0000 mg | ORAL_CAPSULE | Freq: Every day | ORAL | Status: DC
  Administered 2020-07-20 – 2020-07-22 (×3): 100 mg via ORAL
  Filled 2020-07-20 (×3): qty 2

## 2020-07-20 MED ORDER — ALFUZOSIN HCL ER 10 MG PO TB24
10.0000 mg | ORAL_TABLET | Freq: Every day | ORAL | Status: DC
  Administered 2020-07-22 – 2020-07-23 (×2): 10 mg via ORAL
  Filled 2020-07-20 (×3): qty 1

## 2020-07-20 MED ORDER — DOCUSATE SODIUM 100 MG PO CAPS: 100.0000 mg | ORAL_CAPSULE | Freq: Two times a day (BID) | ORAL | 0 refills | Status: DC

## 2020-07-20 MED ORDER — VERAPAMIL HCL ER 180 MG PO TBCR
360.0000 mg | EXTENDED_RELEASE_TABLET | Freq: Every day | ORAL | Status: DC
  Administered 2020-07-20 – 2020-07-22 (×3): 360 mg via ORAL
  Filled 2020-07-20 (×4): qty 2

## 2020-07-20 MED ORDER — ENOXAPARIN SODIUM 30 MG/0.3ML ~~LOC~~ SOLN
30.0000 mg | Freq: Two times a day (BID) | SUBCUTANEOUS | Status: DC
  Administered 2020-07-21 – 2020-07-23 (×5): 30 mg via SUBCUTANEOUS
  Filled 2020-07-20 (×5): qty 0.3

## 2020-07-20 MED ORDER — BUPIVACAINE HCL (PF) 0.5 % IJ SOLN
INTRAMUSCULAR | Status: AC
  Filled 2020-07-20: qty 10

## 2020-07-20 MED ORDER — PHENOL 1.4 % MT LIQD
1.0000 | OROMUCOSAL | Status: DC | PRN
  Filled 2020-07-20: qty 177

## 2020-07-20 MED ORDER — DOCUSATE SODIUM 100 MG PO CAPS
100.0000 mg | ORAL_CAPSULE | Freq: Two times a day (BID) | ORAL | Status: DC
  Administered 2020-07-20 – 2020-07-22 (×5): 100 mg via ORAL
  Filled 2020-07-20 (×5): qty 1

## 2020-07-20 MED ORDER — OXYCODONE HCL 5 MG PO TABS
10.0000 mg | ORAL_TABLET | ORAL | Status: DC | PRN
  Administered 2020-07-20 – 2020-07-21 (×3): 15 mg via ORAL
  Filled 2020-07-20: qty 2
  Filled 2020-07-20 (×3): qty 3

## 2020-07-20 MED ORDER — LIDOCAINE HCL (PF) 2 % IJ SOLN
INTRAMUSCULAR | Status: AC
  Filled 2020-07-20: qty 5

## 2020-07-20 MED ORDER — ORAL CARE MOUTH RINSE: 15.0000 mL | Freq: Once | OROMUCOSAL | Status: AC

## 2020-07-20 MED ORDER — DUTASTERIDE 0.5 MG PO CAPS
0.5000 mg | ORAL_CAPSULE | Freq: Every day | ORAL | Status: DC
  Administered 2020-07-21 – 2020-07-23 (×3): 0.5 mg via ORAL
  Filled 2020-07-20 (×4): qty 1

## 2020-07-20 MED ORDER — FLUTICASONE PROPIONATE 50 MCG/ACT NA SUSP
2.0000 | Freq: Every day | NASAL | Status: DC
  Administered 2020-07-21 – 2020-07-23 (×3): 2 via NASAL
  Filled 2020-07-20: qty 16

## 2020-07-20 MED ORDER — MAGNESIUM HYDROXIDE 400 MG/5ML PO SUSP
30.0000 mL | Freq: Every day | ORAL | Status: DC | PRN
  Administered 2020-07-20 – 2020-07-22 (×2): 30 mL via ORAL
  Filled 2020-07-20: qty 30

## 2020-07-20 MED ORDER — MIDAZOLAM HCL 2 MG/2ML IJ SOLN
INTRAMUSCULAR | Status: AC
  Filled 2020-07-20: qty 2

## 2020-07-20 MED ORDER — AZELASTINE HCL 0.1 % NA SOLN
1.0000 | Freq: Two times a day (BID) | NASAL | Status: DC
  Administered 2020-07-20 – 2020-07-23 (×6): 1 via NASAL
  Filled 2020-07-20 (×2): qty 30

## 2020-07-20 MED ORDER — ONDANSETRON HCL 4 MG/2ML IJ SOLN: 4.0000 mg | Freq: Once | INTRAMUSCULAR | Status: DC | PRN

## 2020-07-20 MED ORDER — NEOMYCIN-POLYMYXIN B GU 40-200000 IR SOLN
Status: AC
  Filled 2020-07-20: qty 20

## 2020-07-20 MED ORDER — HYDROMORPHONE HCL 1 MG/ML IJ SOLN
0.5000 mg | INTRAMUSCULAR | Status: DC | PRN
  Administered 2020-07-20: 1 mg via INTRAVENOUS
  Filled 2020-07-20: qty 1

## 2020-07-20 SURGICAL SUPPLY — 70 items
BLADE SAGITTAL 25.0X1.19X90 (BLADE) ×2 IMPLANT
BLADE SAW 90X13X1.19 OSCILLAT (BLADE) ×2 IMPLANT
BLOCK CUTTING FEMUR SZ6 LEFT (MISCELLANEOUS) ×2 IMPLANT
BLOCK CUTTING TIBIAL 6 LT (MISCELLANEOUS) ×2 IMPLANT
BNDG ELASTIC 6X5.8 VLCR STR LF (GAUZE/BANDAGES/DRESSINGS) ×2 IMPLANT
CANISTER SUCT 1200ML W/VALVE (MISCELLANEOUS) ×2 IMPLANT
CANISTER WOUND CARE 500ML ATS (WOUND CARE) ×2 IMPLANT
CEMENT HV SMART SET (Cement) ×4 IMPLANT
CHLORAPREP W/TINT 26 (MISCELLANEOUS) ×4 IMPLANT
COOLER POLAR GLACIER W/PUMP (MISCELLANEOUS) ×2 IMPLANT
COVER WAND RF STERILE (DRAPES) ×2 IMPLANT
CUFF TOURN SGL QUICK 24 (TOURNIQUET CUFF)
CUFF TOURN SGL QUICK 30 (TOURNIQUET CUFF)
CUFF TRNQT CYL 24X4X16.5-23 (TOURNIQUET CUFF) IMPLANT
CUFF TRNQT CYL 30X4X21-28X (TOURNIQUET CUFF) IMPLANT
DRAPE 3/4 80X56 (DRAPES) ×4 IMPLANT
DRSG MEPILEX SACRM 8.7X9.8 (GAUZE/BANDAGES/DRESSINGS) ×2 IMPLANT
ELECT CAUTERY BLADE 6.4 (BLADE) ×2 IMPLANT
ELECT REM PT RETURN 9FT ADLT (ELECTROSURGICAL) ×2
ELECTRODE REM PT RTRN 9FT ADLT (ELECTROSURGICAL) ×1 IMPLANT
FEMORAL COMPONENT PS L S6N (Femur) ×2 IMPLANT
FEMUR BONE MODEL 4.9010 MEDACT (MISCELLANEOUS) ×2 IMPLANT
GAUZE SPONGE 4X4 12PLY STRL (GAUZE/BANDAGES/DRESSINGS) ×2 IMPLANT
GAUZE XEROFORM 1X8 LF (GAUZE/BANDAGES/DRESSINGS) ×2 IMPLANT
GLOVE SURG ORTHO LTX SZ8 (GLOVE) ×2 IMPLANT
GLOVE SURG SYN 9.0  PF PI (GLOVE) ×1
GLOVE SURG SYN 9.0 PF PI (GLOVE) ×1 IMPLANT
GLOVE SURG UNDER LTX SZ8 (GLOVE) ×2 IMPLANT
GLOVE SURG UNDER POLY LF SZ9 (GLOVE) ×2 IMPLANT
GOWN SRG 2XL LVL 4 RGLN SLV (GOWNS) ×1 IMPLANT
GOWN STRL NON-REIN 2XL LVL4 (GOWNS) ×1
GOWN STRL REUS W/ TWL LRG LVL3 (GOWN DISPOSABLE) ×1 IMPLANT
GOWN STRL REUS W/ TWL XL LVL3 (GOWN DISPOSABLE) ×1 IMPLANT
GOWN STRL REUS W/TWL LRG LVL3 (GOWN DISPOSABLE) ×1
GOWN STRL REUS W/TWL XL LVL3 (GOWN DISPOSABLE) ×1
HOLDER FOLEY CATH W/STRAP (MISCELLANEOUS) ×2 IMPLANT
HOOD PEEL AWAY FLYTE STAYCOOL (MISCELLANEOUS) ×4 IMPLANT
IRRIGATION SURGIPHOR STRL (IV SOLUTION) IMPLANT
IV NS IRRIG 3000ML ARTHROMATIC (IV SOLUTION) ×2 IMPLANT
KIT PREVENA INCISION MGT20CM45 (CANNISTER) ×2 IMPLANT
KIT TURNOVER KIT A (KITS) ×2 IMPLANT
KNEE TIBIAL INSERT FXD 10MM S5 (Insert) ×2 IMPLANT
MANIFOLD NEPTUNE II (INSTRUMENTS) ×2 IMPLANT
NDL SAFETY ECLIPSE 18X1.5 (NEEDLE) ×1 IMPLANT
NEEDLE HYPO 18GX1.5 SHARP (NEEDLE) ×1
NEEDLE SPNL 18GX3.5 QUINCKE PK (NEEDLE) ×2 IMPLANT
NEEDLE SPNL 20GX3.5 QUINCKE YW (NEEDLE) ×2 IMPLANT
NS IRRIG 1000ML POUR BTL (IV SOLUTION) ×2 IMPLANT
PACK TOTAL KNEE (MISCELLANEOUS) ×2 IMPLANT
PAD WRAPON POLAR KNEE (MISCELLANEOUS) ×1 IMPLANT
PATELLA SZ4 CEMENTED (Joint) ×2 IMPLANT
PENCIL SMOKE EVACUATOR COATED (MISCELLANEOUS) ×2 IMPLANT
PULSAVAC PLUS IRRIG FAN TIP (DISPOSABLE) ×2
SCALPEL PROTECTED #10 DISP (BLADE) ×4 IMPLANT
STAPLER SKIN PROX 35W (STAPLE) ×2 IMPLANT
SUCTION FRAZIER HANDLE 10FR (MISCELLANEOUS) ×1
SUCTION TUBE FRAZIER 10FR DISP (MISCELLANEOUS) ×1 IMPLANT
SUT DVC 2 QUILL PDO  T11 36X36 (SUTURE) ×1
SUT DVC 2 QUILL PDO T11 36X36 (SUTURE) ×1 IMPLANT
SUT ETHIBOND 2 V 37 (SUTURE) IMPLANT
SUT V-LOC 90 ABS DVC 3-0 CL (SUTURE) ×2 IMPLANT
SYR 20ML LL LF (SYRINGE) ×2 IMPLANT
SYR 50ML LL SCALE MARK (SYRINGE) ×4 IMPLANT
TIB TRAY FIXED SZ5 L (Joint) ×2 IMPLANT
TIBIAL BONE MODEL LEFT (MISCELLANEOUS) ×2 IMPLANT
TIP FAN IRRIG PULSAVAC PLUS (DISPOSABLE) ×1 IMPLANT
TOWEL OR 17X26 4PK STRL BLUE (TOWEL DISPOSABLE) ×2 IMPLANT
TOWER CARTRIDGE SMART MIX (DISPOSABLE) ×2 IMPLANT
TRAY FOLEY MTR SLVR 16FR STAT (SET/KITS/TRAYS/PACK) ×2 IMPLANT
WRAPON POLAR PAD KNEE (MISCELLANEOUS) ×2

## 2020-07-20 NOTE — Evaluation (Signed)
Occupational Therapy Evaluation Patient Details Name: Austin Sullivan MRN: [id number] DOB: 11-22-1955 Today's Date: 07/20/2020    History of Present Illness Pt is a 65 yo male diagnosed with osteoarthritis of the left knee and is s/p elective L TKA.  PMH includes: SOB, asthma, COPD, HTN, DM, and skin CA.   Clinical Impression   Pt seen for OT evaluation this date, POD#0 from above surgery, spouse present. Pt was independent in all ADL and ADL mobility prior to surgery, however, experiencing increasing L knee pain. Pt is eager to return to PLOF with less pain and improved safety and independence. Pt currently requires minimal assist for LB dressing and bathing while in seated position due to pain and limited AROM of L knee, CGA for ADL transfers, and MOD A for compression stocking mgt and polar care mgt. Spouse reports she is able to provide needed level of assist. Pt/spouse instructed in polar care mgt, falls prevention strategies, pet care considerations, car transfers, home/routines modifications, DME/AE for LB bathing and dressing tasks, and compression stocking mgt. Handout provided to support recall and carryover. Pt and spouse verbalized understanding. Spouse endorses having had both knees replaced a few years ago. Do not anticipate additional skilled OT needs at this time. Will sign off.       Follow Up Recommendations  No OT follow up    Equipment Recommendations  None recommended by OT    Recommendations for Other Services       Precautions / Restrictions Precautions Precautions: Fall Restrictions Weight Bearing Restrictions: Yes LLE Weight Bearing: Weight bearing as tolerated      Mobility Bed Mobility             General bed mobility comments: NT, pt up in recliner    Transfers Overall transfer level: Needs assistance Equipment used: Rolling walker (2 wheeled) Transfers: Sit to/from Stand Sit to Stand: Min guard         General transfer comment: Min to mod  verbal cues for sequencing for hand and L foot placement; fair eccentric and concentric control and stability    Balance Overall balance assessment: Needs assistance   Sitting balance-Leahy Scale: Normal     Standing balance support: Bilateral upper extremity supported;During functional activity Standing balance-Leahy Scale: Good Standing balance comment: Min to mod lean on the RW for support                           ADL either performed or assessed with clinical judgement   ADL                                         General ADL Comments: Pt currently requires MIN A for LB ADL in seated position, MOD A for compression stocking mgt and polar care mgt, CGA for ADL transfers with RW; spouse able to provide needed level of assist     Vision Patient Visual Report: No change from baseline       Perception     Praxis      Pertinent Vitals/Pain Pain Assessment: Faces Pain Score: 4  Faces Pain Scale: Hurts a little bit Pain Location: L knee with movement Pain Descriptors / Indicators: Sore Pain Intervention(s): Limited activity within patient's tolerance;Monitored during session;Premedicated before session;Repositioned;Ice applied     Hand Dominance     Extremity/Trunk Assessment Upper Extremity Assessment Upper Extremity  Assessment: Overall WFL for tasks assessed   Lower Extremity Assessment Lower Extremity Assessment: LLE deficits/detail LLE Deficits / Details: s/p L TKA LLE: Unable to fully assess due to pain LLE Sensation: WNL       Communication Communication Communication: No difficulties   Cognition Arousal/Alertness: Awake/alert Behavior During Therapy: WFL for tasks assessed/performed Overall Cognitive Status: Within Functional Limits for tasks assessed                                     General Comments       Exercises  Other Exercises: Pt/spouse instructed in polar care mgt, falls prevention, AE/DME,  home/routines modifications, pet care considerations, car transfers, and compression stocking mgt; handout provided to support recall and carryover   Shoulder Instructions      Home Living Family/patient expects to be discharged to:: Private residence Living Arrangements: Spouse/significant other Available Help at Discharge: Family;Available 24 hours/day Type of Home: House Home Access: Stairs to enter [employer] of Steps: 1 Entrance Stairs-Rails: None Home Layout: One level     Bathroom Shower/Tub: Tub/shower unit;Walk-in shower   Bathroom Toilet: Handicapped height     Home Equipment: Walker - 2 wheels;Crutches;Toilet riser;Shower seat;Hand held shower head   Additional Comments: Toilet elevated with arm rests      Prior Functioning/Environment Level of Independence: Independent        Comments: Ind amb community distances without an AD, Ind with ADLs, no fall history        OT Problem List: Decreased strength;Decreased range of motion;Pain      OT Treatment/Interventions:      OT Goals(Current goals can be found in the care plan section) Acute Rehab OT Goals Patient Stated Goal: To walk with more confidence OT Goal Formulation: All assessment and education complete, DC therapy  OT Frequency:     Barriers to D/C:            Co-evaluation              AM-PAC OT "6 Clicks" Daily Activity     Outcome Measure Help from another person eating meals?: None Help from another person taking care of personal grooming?: None Help from another person toileting, which includes using toliet, bedpan, or urinal?: A Little Help from another person bathing (including washing, rinsing, drying)?: A Little Help from another person to put on and taking off regular upper body clothing?: None Help from another person to put on and taking off regular lower body clothing?: A Little 6 Click Score: 21   End of Session    Activity Tolerance: Patient tolerated  treatment well Patient left: in chair;with call bell/phone within reach;with chair alarm set;with family/visitor present;with SCD's reapplied;Other (comment) (polar care in place, rolled towel under L ankle)  OT Visit Diagnosis: Other abnormalities of gait and mobility (R26.89);Pain Pain - Right/Left: Left Pain - part of body: Knee                Time: 9476-5465 OT Time Calculation (min): 22 min Charges:  OT General Charges $OT Visit: 1 Visit OT Evaluation $OT Eval Low Complexity: 1 Low OT Treatments $Self Care/Home Management : 8-22 mins  Hanley Hays, MPH, MS, OTR/L ascom [phone] 07/20/20, 4:53 PM

## 2020-07-20 NOTE — Anesthesia Preprocedure Evaluation (Signed)
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 Nodular and infiltrative               patterns. Simple excision/EDC. 11/03/2019: Basal cell carcinoma     Comment:  left lat brow No date: COPD (chronic obstructive pulmonary disease) ([facility]CC) No date: Diabetes mellitus without complication ([place]) No date: Dyspnea No date: Hypertension 2001: Melanoma ([place])     Comment:  Right forehead. Metastatic melanoma 2016 No date: Sleep apnea  Reproductive/Obstetrics                             Anesthesia Physical Anesthesia Plan  ASA: III  Anesthesia Plan: General/Spinal   Post-op Pain Management:    Induction: Intravenous  PONV Risk Score and Plan: 2 and Ondansetron, Dexamethasone, Propofol infusion, TIVA and  Midazolam  Airway Management Planned: Natural Airway and Oral ETT  Additional Equipment: None  Intra-op Plan:   Post-operative Plan:   Informed Consent: I have reviewed the patients History and Physical, chart, labs and discussed the procedure including the risks, benefits and alternatives for the proposed anesthesia with the patient or authorized representative who has indicated his/her understanding and acceptance.       Plan Discussed with: CRNA and Surgeon  Anesthesia Plan Comments: (Discussed R/B/A of neuraxial anesthesia technique with patient: - rare risks of spinal/epidural hematoma, nerve damage, infection - Risk of PDPH - Risk of nausea and vomiting - Risk of conversion to general anesthesia and its associated risks, including sore throat, damage to lips/teeth/oropharynx, and rare risks such as cardiac and respiratory events. Patient counseled on being higher risk for anesthesia due to comorbidities: severe COPD and OSA. Patient was told about increased risk of cardiac and respiratory events, including death. Patient understands.  )        Anesthesia Quick Evaluation

## 2020-07-20 NOTE — Anesthesia Procedure Notes (Signed)
Spinal  Patient location during procedure: OR Start time: 07/20/2020 7:20 AM End time: 07/20/2020 7:24 AM Reason for block: surgical anesthesia Staffing Performed: other anesthesia staff  Anesthesiologist: [name], MD Resident/CRNA: Hedda Slade, CRNA Other anesthesia staff: Joellyn Quails, RN Preanesthetic Checklist Completed: patient identified, IV checked, site marked, risks and benefits discussed, surgical consent, monitors and equipment checked, pre-op evaluation and timeout performed Spinal Block Patient position: sitting Prep: Betadine Patient monitoring: heart rate, continuous pulse ox, blood pressure and cardiac monitor Approach: midline Location: L3-4 Injection technique: single-shot Needle Needle type: Whitacre and Introducer  Needle gauge: 24 G Needle length: 9 cm Assessment Sensory level: T4 Events: CSF return Additional Notes Negative paresthesia. Negative blood return. Positive free-flowing CSF. Expiration date of kit checked and confirmed. Patient tolerated procedure well, without complications.

## 2020-07-20 NOTE — Evaluation (Signed)
Physical Therapy Evaluation Patient Details Name: Austin Sullivan MRN: [id number] DOB: Nov 29, 1955 Today's Date: 07/20/2020   History of Present Illness  Pt is a 65 yo male diagnosed with osteoarthritis of the left knee and is s/p elective L TKA.  PMH includes: SOB, asthma, COPD, HTN, DM, and skin CA.    Clinical Impression  Pt was pleasant and motivated to participate during the session and overall performed very well during the session considering POD#0 status.  Pt required no physical assistance during the session and demonstrated fair to good stability and control with transfers and limited amb at the EOB.  Pt reported only min to mod L knee pain during the session with L knee AROM 11-90 deg.  Pt is expected to make good progress towards goals while in acute care and will benefit from HHPT upon discharge to safely address deficits listed in patient problem list for decreased caregiver assistance and eventual return to PLOF.      Follow Up Recommendations Home health PT;Supervision for mobility/OOB    Equipment Recommendations  None recommended by PT    Recommendations for Other Services       Precautions / Restrictions Precautions Precautions: Fall Restrictions Weight Bearing Restrictions: Yes LLE Weight Bearing: Weight bearing as tolerated      Mobility  Bed Mobility Overal bed mobility: Modified Independent             General bed mobility comments: Extra time and effort only    Transfers Overall transfer level: Needs assistance Equipment used: Rolling walker (2 wheeled) Transfers: Sit to/from Stand Sit to Stand: Min guard         General transfer comment: Min to mod verbal cues for sequencing for hand and L foot placement; fair eccentric and concentric control and stability  Ambulation/Gait Ambulation/Gait assistance: Min guard Gait Distance (Feet): 3 Feet Assistive device: Rolling walker (2 wheeled) Gait Pattern/deviations: Step-to pattern;Decreased stance  time - left;Trunk flexed Gait velocity: decreased   General Gait Details: Pt able to take several steps at the EOB and then to the recliner with good control and stability with min verbal cues for sequencing  Stairs            Wheelchair Mobility    Modified Rankin (Stroke Patients Only)       Balance Overall balance assessment: Needs assistance   Sitting balance-Leahy Scale: Normal     Standing balance support: Bilateral upper extremity supported;During functional activity Standing balance-Leahy Scale: Good Standing balance comment: Min to mod lean on the RW for support                             Pertinent Vitals/Pain Pain Assessment: 0-10 Pain Score: 4  Pain Location: L knee with movement Pain Descriptors / Indicators: Sore Pain Intervention(s): Premedicated before session;Monitored during session    Home Living Family/patient expects to be discharged to:: Private residence Living Arrangements: Spouse/significant other Available Help at Discharge: Family;Available 24 hours/day Type of Home: House Home Access: Stairs to enter Entrance Stairs-Rails: None Entrance Stairs-Number of Steps: 1 Home Layout: One level Home Equipment: Walker - 2 wheels;Crutches Additional Comments: Toilet elevated with arm rests    Prior Function Level of Independence: Independent         Comments: Ind amb community distances without an AD, Ind with ADLs, no fall history     Hand Dominance        Extremity/Trunk Assessment   Upper Extremity Assessment  Upper Extremity Assessment: Overall WFL for tasks assessed    Lower Extremity Assessment Lower Extremity Assessment: LLE deficits/detail LLE Deficits / Details: L hip flex >/= 3/5; BLE ankle AROM, strength, and sensation to light touch grossly intact LLE: Unable to fully assess due to pain LLE Sensation: WNL       Communication   Communication: No difficulties  Cognition Arousal/Alertness:  Awake/alert Behavior During Therapy: WFL for tasks assessed/performed Overall Cognitive Status: Within Functional Limits for tasks assessed                                        General Comments      Exercises Total Joint Exercises Ankle Circles/Pumps: AROM;Strengthening;Both;10 reps Quad Sets: AROM;Strengthening;Left;5 reps;10 reps Long Arc Quad: AROM;Strengthening;Left;10 reps;15 reps Knee Flexion: AROM;Strengthening;Left;10 reps;15 reps Goniometric ROM: L knee AROM: 11-90 deg Marching in Standing: AROM;Strengthening;Both;5 reps;Standing Other Exercises Other Exercises: HEP education per handout Other Exercises: LLE positioning to encourage L knee ext PROM and prevent heel pressure   Assessment/Plan    PT Assessment Patient needs continued PT services  PT Problem List Decreased strength;Decreased range of motion;Decreased activity tolerance;Decreased balance;Decreased mobility;Decreased knowledge of use of DME;Pain       PT Treatment Interventions DME instruction;Gait training;Stair training;Functional mobility training;Therapeutic activities;Therapeutic exercise;Balance training;Patient/family education    PT Goals (Current goals can be found in the Care Plan section)  Acute Rehab PT Goals Patient Stated Goal: To walk with more confidence PT Goal Formulation: With patient Time For Goal Achievement: 08/02/20 Potential to Achieve Goals: Good    Frequency BID   Barriers to discharge        Co-evaluation               AM-PAC PT "6 Clicks" Mobility  Outcome Measure Help needed turning from your back to your side while in a flat bed without using bedrails?: A Little Help needed moving from lying on your back to sitting on the side of a flat bed without using bedrails?: A Little Help needed moving to and from a bed to a chair (including a wheelchair)?: A Little Help needed standing up from a chair using your arms (e.g., wheelchair or bedside  chair)?: A Little Help needed to walk in hospital room?: A Little Help needed climbing 3-5 steps with a railing? : A Little 6 Click Score: 18    End of Session Equipment Utilized During Treatment: Gait belt Activity Tolerance: Patient tolerated treatment well Patient left: in chair;with call bell/phone within reach;with chair alarm set;with family/visitor present;with SCD's reapplied;Other (comment) (Polar care donned to L knee) Nurse Communication: Mobility status;Weight bearing status PT Visit Diagnosis: Other abnormalities of gait and mobility (R26.89);Muscle weakness (generalized) (M62.81);Pain Pain - Right/Left: Left Pain - part of body: Knee    Time: 1510-1559 PT Time Calculation (min) (ACUTE ONLY): 49 min   Charges:   PT Evaluation $PT Eval Moderate Complexity: 1 Mod PT Treatments $Therapeutic Exercise: 8-22 mins $Therapeutic Activity: 8-22 mins        D. Royetta Asal PT, DPT 07/20/20, 4:18 PM

## 2020-07-20 NOTE — Progress Notes (Signed)
Met with the patient to discuss DC plan and needs He lives at home with his wife Has 3 in 1 and a RW at home, Set up with [facility] for [facility] Is able to do lovenox Has transportation, can afford meds

## 2020-07-20 NOTE — Op Note (Signed)
07/20/2020  10:01 AM  PATIENT:  Austin Sullivan   MRN: [id number]  PRE-OPERATIVE DIAGNOSIS:  Primary localized osteoarthritis of left knee   POST-OPERATIVE DIAGNOSIS:  Same   PROCEDURE:  Procedure(s): Left TOTAL KNEE ARTHROPLASTY   SURGEON: Laurene Footman, MD   ASSISTANTS: [name], PA-C   ANESTHESIA:   spinal   EBL:   100   BLOOD ADMINISTERED:none   DRAINS: Incisional wound VAC    LOCAL MEDICATIONS USED:  MARCAINE    and OTHER Exparel and morphine   SPECIMEN:  No Specimen   DISPOSITION OF SPECIMEN:  N/A   COUNTS:  YES   TOURNIQUET:   80 at 300 mm Hg   IMPLANTS: Medacta  GMK PS system with  6 left femur, 5 left tibia and  10 mm PS insert.  Size  for patella, all components cemented.   DICTATION: Viviann Spare Dictation   patient was brought to the operating room and spinal anesthesia was obtained.  After prepping and draping the  left leg in sterile fashion, and after patient identification and timeout procedures were completed, tourniquet was raised  and midline skin incision was made followed by medial parapatellar arthrotomy with  advanced medial compartment osteoarthritis, severe patellofemoral arthritis and  severe lateral compartment arthritis, partial synovectomy was also carried out.   The ACL and PCL and fat pad were excised along with anterior horns of the meniscus. The proximal tibia cutting guide from  the [facility] system was applied and the proximal tibia cut carried out.  The distal femoral cut was carried out in a similar fashion     The  6 femoral cutting guide applied with anterior posterior and chamfer cuts made.  The posterior horns of the menisci were removed at this point.   Injection of the above medication was carried out after the femoral and tibial cuts were carried out.  The  5 baseplate trial was placed pinned into position and proximal tibial preparation carried out with drilling hand reaming and the keel punch followed by placement of the  6 femur and sizing  the tibial insert size   10 millimeter gave the best fit with stability and full extension.  The distal femoral drill holes were made in the notch cut for the trochlear groove was then carried out with trials were then removed the patella was cut using the patellar cutting guide and it sized to a size  for after drill holes have been made  The knee was irrigated with pulsatile lavage and the bony surfaces dried the tibial component was cemented into place first.  Excess cement was removed and the polyethylene insert placed with a torque screw placed with a torque screwdriver tightened.  The distal femoral component was placed and the knee was held in extension as the patellar button was clamped into place.  After the cement was set, excess cement was removed and the knee was again irrigated thoroughly thoroughly irrigated.  The tourniquet was let down and hemostasis checked with electrocautery. The arthrotomy was repaired with a heavy Quill suture,  followed by 3-0 V lock subcuticular closure, skin staples followed by incisional wound VAC and Polar Care.Marland Kitchen   PLAN OF CARE: Admit to inpatient    PATIENT DISPOSITION:  PACU - hemodynamically stable.

## 2020-07-20 NOTE — Transfer of Care (Signed)
Immediate Anesthesia Transfer of Care Note  Patient: Austin Sullivan  Procedure(s) Performed: TOTAL KNEE ARTHROPLASTY - [name] to Assist (Left Knee)  Patient Location: PACU  Anesthesia Type:MAC and Spinal  Level of Consciousness: awake and patient cooperative  Airway & Oxygen Therapy: Patient Spontanous Breathing and Patient connected to face mask oxygen  Post-op Assessment: Report given to RN and Post -op Vital signs reviewed and stable  Post vital signs: Reviewed and stable  Last Vitals:  Vitals Value Taken Time  BP 111/59 07/20/20 0953  Temp    Pulse 83 07/20/20 0957  Resp 10 07/20/20 0957  SpO2 95 % 07/20/20 0957  Vitals shown include unvalidated device data.  Last Pain:  Vitals:   07/20/20 0612  TempSrc: Temporal  PainSc: 0-No pain         Complications: No complications documented.

## 2020-07-20 NOTE — Plan of Care (Signed)
  Problem: Health Behavior/Discharge Planning: Goal: Ability to manage health-related needs will improve Outcome: Progressing   Problem: Clinical Measurements: Goal: Ability to maintain clinical measurements within normal limits will improve Outcome: Progressing Goal: Will remain free from infection Outcome: Progressing   Problem: Activity: Goal: Risk for activity intolerance will decrease Outcome: Progressing   Problem: Nutrition: Goal: Adequate nutrition will be maintained Outcome: Progressing   Problem: Coping: Goal: Level of anxiety will decrease Outcome: Progressing   Problem: Education: Goal: Knowledge of the prescribed therapeutic regimen will improve Outcome: Progressing   Problem: Activity: Goal: Ability to avoid complications of mobility impairment will improve Outcome: Progressing   Problem: Pain Management: Goal: Pain level will decrease with appropriate interventions Outcome: Progressing

## 2020-07-20 NOTE — H&P (Signed)
Chief Complaint  Patient presents with  . Pre-op Exam  Left TKA scheduled 07/20/20 w/ Dr. Rudene Christians    History of the Present Illness: Austin Sullivan is a 65 y.o. male here today for history and physical for left total knee arthroplasty with Dr. Hessie Knows on 07/20/2020. Patient has x-rays showing complete loss of joint space in the lateral compartment with moderate to severe valgus deformity. Spurring noted in the lateral compartment. Severe arthritic changes in the patellofemoral compartment as well as medial compartment. Pain has been present for greater than 1 year. Pain is been severely debilitating over the last 6 months. He has been wearing a brace with very little relief. He has had no relief with injection therapy as well as over-the-counter medications. Pain is interfering with quality of life and activities day living.  The patient was told that when they replaced his left hip he was going to have to have a left knee arthroplasty and/or right knee arthroplasty eventually.   He has had stage IV melanoma.   His primary care physician is Barnabas Lister, MD at the [facility]. His most recent A1C was under 7.  The patient is retired. 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 He had an accident at work falling 20 feet, breaking his wrist and jaw.   I have reviewed past medical, surgical, social and family history, and allergies as documented in the EMR.  Past Medical History: Past Medical History:  Diagnosis Date  . Allergic rhinitis due to allergen 06/09/1970  . Allergy  . Asthma, unspecified asthma severity, unspecified whether complicated, unspecified whether persistent  . COPD (chronic obstructive pulmonary disease) (CMS-HCC)  . Dermatitis 08/10/1997  . Diabetes mellitus without complication (CMS-HCC)  . History of cancer  melanoma  . Hyperlipidemia  .  Hypertension  . Obesity  . Sleep apnea   Past Surgical History: Past Surgical History:  Procedure Laterality Date  . CATARACT EXTRACTION  . Deviated septum  . Melanoma  right side forehead  . Spacer in throat  . Testical removed Left  . Torn retina Bilateral   Past Family History: Family History  Problem Relation Age of Onset  . Diabetes type II Mother  . Myocardial Infarction (Heart attack) Mother  . Stroke Mother  . Allergies Mother  . Myocardial Infarction (Heart attack) Father  . High blood pressure (Hypertension) Sister  . Stroke Brother  . Diabetes type II Brother  . Lymphoma Brother  . Cancer Brother  . Myocardial Infarction (Heart attack) Maternal Grandmother  . Myocardial Infarction (Heart attack) Maternal Grandfather  . Dementia Paternal Grandmother  . High blood pressure (Hypertension) Sister  . Asthma Sister  . COPD Sister   Medications: Current Outpatient Medications Ordered in Epic  Medication Sig Dispense Refill  . alfuzosin (UROXATRAL) 10 mg ER tablet TAKE 1 TABLET BY MOUTH EVERY DAY 90 tablet 1  . azelastine (ASTELIN) 137 mcg nasal spray PLACE 1 SPRAY INTO BOTH NOSTRILS 2 (TWO) TIMES DAILY 90 mL 3  . BREO ELLIPTA 100-25 mcg/dose DsDv inhaler INHALE 1 PUFF INTO THE LUNGS ONCE DAILY 60 Disk 5  . chlorthalidone 25 MG tablet TAKE 1 TABLET BY MOUTH EVERY DAY 90 tablet 1  . cholecalciferol (VITAMIN D3) 1000 unit capsule Take 2,000 Units by mouth once daily  . clindamycin (CLEOCIN) 300 MG capsule TAKE 2 CAPSULES BY MOUTH 1 HOUR BEFORE DENTAL APPTS. KEEP  EXTRA FOR FURTHER APPTS  . co-enzyme Q-10, ubiquinone, 100 mg capsule Take 1 capsule by mouth once daily  . cycloSPORINE (RESTASIS) 0.05 % ophthalmic emulsion Place 1 drop into both eyes 3 (three) times a day  . dutasteride (AVODART) 0.5 mg capsule Take 1 capsule (0.5 mg total) by mouth once daily 90 capsule 3  . fluticasone propionate (FLONASE) 50 mcg/actuation nasal spray SPRAY 2 SPRAYS INTO EACH NOSTRIL  EVERY DAY 48 g 3  . ipratropium (ATROVENT) 0.06 % nasal spray PLACE 2 SPRAYS INTO BOTH NOSTRILS 2 (TWO) TIMES DAILY 45 mL 1  . ipratropium-albuteroL (DUO-NEB) nebulizer solution Take 3 mLs by nebulization 2 (two) times daily as needed for Shortness of Breath for up to 360 days 540 mL 1  . lidocaine-prilocaine (EMLA) cream Apply topically once  . LINZESS 145 mcg capsule TAKE 1 CAPSULE BY MOUTH EVERY DAY 90 capsule 1  . metFORMIN (GLUCOPHAGE) 1000 MG tablet TAKE 1 TABLET BY MOUTH TWICE A DAY WITH MEALS 180 tablet 1  . metoprolol tartrate (LOPRESSOR) 25 MG tablet TAKE 1 TABLET BY MOUTH EVERY DAY 90 tablet 1  . montelukast (SINGULAIR) 10 mg tablet TAKE 1 TABLET BY MOUTH EVERY DAY AT NIGHT 90 tablet 3  . pregabalin (LYRICA) 50 MG capsule TAKE 1 CAPSULE WITH EACH MEAL, THEN 2 AT BEDTIME 450 capsule 1  . ramipriL (ALTACE) 10 MG capsule TAKE 2 CAPSULES (20 MG TOTAL) BY MOUTH ONCE DAILY 180 capsule 1  . rosuvastatin (CRESTOR) 40 MG tablet TAKE 1 TABLET BY MOUTH EVERY DAY 90 tablet 1  . traMADoL (ULTRAM) 50 mg tablet Take 1-2 tablets (50-100 mg total) by mouth every 6 (six) hours as needed for Pain 30 tablet 1  . ubidecarenone/vitamin E mixed (COQ10 SG 100 ORAL) Take 100 mg by mouth once daily  . verapamiL (VERELAN) 180 MG SR capsule TAKE 2 CAPSULES BY MOUTH DAILY WITH EVENING MEAL 180 capsule 1   No current Epic-ordered facility-administered medications on file.   Allergies: Allergies  Allergen Reactions  . Ancef [Cefazolin] Rash  . Bee Sting Kit Hives    Body mass index is 33.43 kg/m.  Review of Systems: A comprehensive 14 point ROS was performed, reviewed, and the pertinent orthopaedic findings are documented in the HPI.  Vitals:  07/09/20 1520  BP: 120/76  Resp: 18    General Physical Examination:   General:  Well developed, well nourished, no apparent distress, normal affect, antalgic gait  HEENT: Head normocephalic, atraumatic, PERRL.   Abdomen: Soft, non tender, non  distended, Bowel sounds present.  Heart: Examination of the heart reveals regular, rate, and rhythm. There is no murmur noted on ascultation. There is a normal apical pulse.  Lungs: Lungs are clear to auscultation. There is no wheeze, rhonchi, or crackles. There is normal expansion of bilateral chest walls.   Musculoskeletal Examination:  On examination, the left knee has a 10 degree flexion contracture. Severe valgus deformity of bilateral knees, left more severe than the right, partially correctable. Antalgic gait.   Radiographs:  His prior x-rays were reviewed today as noted above.  Assessment: ICD-10-CM  1. Primary osteoarthritis of left knee M17.12   Plan: 82. 65 year old male with severe degenerative arthritis of the left knee. Pain has been progressively increasing over the last year with decrease in activities daily living and quality of life. Patient's failed conservative treatment. Risks, benefits, complications of a left total knee arthroplasty have been discussed with the patient. Patient has agreed and consented to procedure with Dr.  Hessie Knows on 07/20/2020.   Electronically signed by Feliberto Gottron, [place] at 07/09/2020 3:31 PM EDT  Reviewed  H+P. No changes noted.

## 2020-07-20 NOTE — Discharge Instructions (Signed)

## 2020-07-20 NOTE — Discharge Summary (Addendum)
Physician Discharge Summary  Patient ID: Austin Sullivan MRN: [id number] DOB/AGE: 08-17-1955 65 y.o.  Admit date: 07/20/2020 Discharge date: 07/23/20 Admission Diagnoses:  S/P TKR (total knee replacement) using cement, left [Z96.652]  Discharge Diagnoses: Patient Active Problem List   Diagnosis Date Noted  . 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 Arthritis   . Basal cell carcinoma 01/30/2019   Left ear, superior helix. Nodular and infiltrative patterns. Simple excision/EDC.  . Basal cell carcinoma 11/03/2019   left lat brow  . COPD (chronic obstructive pulmonary disease) ([place])   . Diabetes mellitus without complication ([place])   . Dyspnea   . Hypertension   . Melanoma ([place]) 2001   Right forehead. Metastatic melanoma 2016  . Sleep apnea      Transfusion: none   Consultants (if any):   Discharged Condition: Improved  Hospital Course: Austin Sullivan is an 65 y.o. male who was admitted 07/20/2020 with a diagnosis of <principal problem not specified> and went to the operating room on 07/20/2020 and underwent the above named procedures.    Surgeries: Procedure(s): TOTAL KNEE ARTHROPLASTY - [name] to Assist on 07/20/2020 Patient tolerated the surgery well. Taken to PACU where she was stabilized and then transferred to the orthopedic floor.  Started on Lovenox 30 mg q 24 hrs. Foot pumps applied bilaterally at 80 mm. Heels elevated on bed with rolled towels. No evidence of DVT. Negative Homan. Physical therapy started on day #1 for gait training and transfer. OT started day #1 for ADL and assisted devices.  The patient ambulated 40 feet on postop day 1.  Patient's foley was d/c on day #1. Patient's IV was d/c on day #2.  On post op day #2 the patient had a very large BM which caused him to become hypotensive.  BP significantly improved on POD3.  Implants: Medacta GMK PS system with 6 leftfemur, 5 lefttibia  and 10 mm PS insert. Size forpatella, all components cemented.  He was given perioperative antibiotics:  Anti-infectives (From admission, onward)   Start     Dose/Rate Route Frequency Ordered Stop   07/20/20 1330  clindamycin (CLEOCIN) IVPB 900 mg        900 mg 100 mL/hr over 30 Minutes Intravenous Every 6 hours 07/20/20 1147 07/21/20 0517   07/20/20 0616  clindamycin (CLEOCIN) 900 MG/50ML IVPB       Note to Pharmacy: Norton Blizzard  : cabinet override      07/20/20 0616 07/20/20 1547   07/20/20 0615  clindamycin (CLEOCIN) IVPB 900 mg        900 mg 100 mL/hr over 30 Minutes Intravenous On call to O.R. 07/20/20 0814 07/20/20 0746    .  He was given sequential compression devices, early ambulation, and Lovenox TEDs for DVT prophylaxis.  He benefited maximally from the hospital stay and there were no complications.    Recent vital signs:  Vitals:   07/23/20 0054 07/23/20 0409  BP: 114/61 117/61  Pulse: 83 78  Resp: 17 16  Temp: (!) 97.4 F (36.3 C) 98.7 F (37.1 C)  SpO2: 94% 95%    Recent laboratory studies:  Lab Results  Component Value Date   HGB 10.1 (L) 07/22/2020   HGB 11.5 (L) 07/21/2020   HGB 11.9 (L) 07/20/2020   Lab Results  Component Value Date   WBC 11.0 (H) 07/22/2020   PLT 217 07/22/2020   No results found for: INR Lab  Results  Component Value Date   NA 133 (L) 07/22/2020   K 3.2 (L) 07/22/2020   CL 97 (L) 07/22/2020   CO2 27 07/22/2020   BUN 14 07/22/2020   CREATININE 0.85 07/22/2020   GLUCOSE 136 (H) 07/22/2020    Discharge Medications:   Allergies as of 07/23/2020      Reactions   Bee Venom Swelling   Cefazolin Hives, Rash      Medication List    TAKE these medications   alfuzosin 10 MG 24 hr tablet Commonly known as: UROXATRAL Take 10 mg by mouth daily.   Azelastine HCl 137 MCG/SPRAY Soln Place 1 spray into the nose in the morning and at bedtime.   Breo Ellipta 100-25 MCG/INH Aepb Generic drug: fluticasone  furoate-vilanterol Inhale 1 puff into the lungs daily.   chlorthalidone 25 MG tablet Commonly known as: HYGROTON Take 25 mg by mouth daily.   Cholecalciferol 50 MCG (2000 UT) Caps Take 2,000 Units by mouth daily.   Coenzyme Q10 100 MG capsule Take 100 mg by mouth daily.   cycloSPORINE 0.05 % ophthalmic emulsion Commonly known as: RESTASIS Place 1 drop into both eyes 2 (two) times daily.   docusate sodium 100 MG capsule Commonly known as: COLACE Take 1 capsule (100 mg total) by mouth 2 (two) times daily.   dutasteride 0.5 MG capsule Commonly known as: AVODART Take 0.5 mg by mouth daily.   enoxaparin 40 MG/0.4ML injection Commonly known as: Lovenox Inject 0.4 mLs (40 mg total) into the skin daily for 14 days.   Fish Oil 1000 MG Caps Take 1,000 mg by mouth daily.   fluticasone 50 MCG/ACT nasal spray Commonly known as: FLONASE Place 2 sprays into both nostrils daily.   ipratropium 0.06 % nasal spray Commonly known as: ATROVENT Place 2 sprays into both nostrils 2 (two) times daily.   lidocaine-prilocaine cream Commonly known as: EMLA Apply 1 application topically daily as needed (port access).   Linzess 145 MCG Caps capsule Generic drug: linaclotide Take 145 mcg by mouth daily.   metFORMIN 1000 MG tablet Commonly known as: GLUCOPHAGE Take 1,000 mg by mouth 2 (two) times daily.   metoprolol tartrate 25 MG tablet Commonly known as: LOPRESSOR Take 25 mg by mouth daily.   montelukast 10 MG tablet Commonly known as: SINGULAIR Take 10 mg by mouth at bedtime.   oxyCODONE 5 MG immediate release tablet Commonly known as: Oxy IR/ROXICODONE Take 1-2 tablets (5-10 mg total) by mouth every 4 (four) hours as needed for moderate pain (pain score 4-6).   pregabalin 50 MG capsule Commonly known as: LYRICA Take 50 mg by mouth See admin instructions. 50 mg with each meal, 100 mg at bedtime   ramipril 10 MG capsule Commonly known as: ALTACE Take 20 mg by mouth daily.    rosuvastatin 40 MG tablet Commonly known as: CRESTOR Take 40 mg by mouth daily.   traMADol 50 MG tablet Commonly known as: ULTRAM Take 1 tablet (50 mg total) by mouth every 6 (six) hours as needed. What changed: reasons to take this   verapamil 180 MG 24 hr capsule Commonly known as: VERELAN PM Take 360 mg by mouth at bedtime.            Durable Medical Equipment  (From admission, onward)         Start     Ordered   07/20/20 1148  DME Walker rolling  Once       Question Answer Comment  Walker:  With 5 Inch Wheels   Patient needs a walker to treat with the following condition S/P TKR (total knee replacement) using cement, left      07/20/20 1147   07/20/20 1148  DME 3 n 1  Once        07/20/20 1147   07/20/20 1148  DME Bedside commode  Once       Question:  Patient needs a bedside commode to treat with the following condition  Answer:  S/P TKR (total knee replacement) using cement, left   07/20/20 1147          Diagnostic Studies: DG Knee 1-2 Views Left  Result Date: 07/20/2020 CLINICAL DATA:  Status post left knee replacement today. EXAM: LEFT KNEE - 1-2 VIEW COMPARISON:  None. FINDINGS: Left knee arthroplasty is in place. Gas in the soft tissues and surgical staples are noted. No acute abnormality. IMPRESSION: Status post left knee replacement.  No acute abnormality. Electronically Signed   By: Inge Rise M.D.   On: 07/20/2020 10:48    Disposition: Discharge disposition: 01-Home or Self Care          Follow-up Information    [facility] On 08/06/2020.   Specialty: Orthopedic Surgery Why: @ 1:15 pm Contact information: [facility] [address] [place] [address] [phone]              Signed: PRUDENCIO VELAZCO PA-C 07/23/2020, 7:38 AM

## 2020-07-21 LAB — GLUCOSE, CAPILLARY
Glucose-Capillary: 231 mg/dL — ABNORMAL HIGH (ref 70–99)
Glucose-Capillary: 235 mg/dL — ABNORMAL HIGH (ref 70–99)
Glucose-Capillary: 208 mg/dL — ABNORMAL HIGH (ref 70–99)
Glucose-Capillary: 195 mg/dL — ABNORMAL HIGH (ref 70–99)

## 2020-07-21 LAB — BASIC METABOLIC PANEL
Anion gap: 13 (ref 5–15)
BUN: 20 mg/dL (ref 8–23)
CO2: 26 mmol/L (ref 22–32)
Calcium: 8.6 mg/dL — ABNORMAL LOW (ref 8.9–10.3)
Chloride: 98 mmol/L (ref 98–111)
Creatinine, Ser: 1.05 mg/dL (ref 0.61–1.24)
GFR, Estimated: >60 mL/min (ref >60)
Glucose, Bld: 211 mg/dL — ABNORMAL HIGH (ref 70–99)
Potassium: 3 mmol/L — ABNORMAL LOW (ref 3.5–5.1)
Sodium: 137 mmol/L (ref 135–145)

## 2020-07-21 LAB — CBC
HCT: 34.3 % — ABNORMAL LOW (ref 39.0–52.0)
Hemoglobin: 11.5 g/dL — ABNORMAL LOW (ref 13.0–17.0)
MCH: 27.1 pg (ref 26.0–34.0)
MCHC: 33.5 g/dL (ref 30.0–36.0)
MCV: 80.7 fL (ref 80.0–100.0)
Platelets: 228 10*3/uL (ref 150–400)
RBC: 4.25 MIL/uL (ref 4.22–5.81)
RDW: 17.3 % — ABNORMAL HIGH (ref 11.5–15.5)
WBC: 9.8 10*3/uL (ref 4.0–10.5)
nRBC: 0 % (ref 0.0–0.2)

## 2020-07-21 MED ORDER — MAGNESIUM HYDROXIDE 400 MG/5ML PO SUSP
30.0000 mL | Freq: Once | ORAL | Status: DC
  Filled 2020-07-21: qty 30

## 2020-07-21 MED ORDER — POTASSIUM CHLORIDE 20 MEQ PO PACK
20.0000 meq | PACK | Freq: Three times a day (TID) | ORAL | Status: AC
  Administered 2020-07-21 (×2): 20 meq via ORAL
  Filled 2020-07-21 (×3): qty 1

## 2020-07-21 NOTE — Progress Notes (Signed)
Physical Therapy Treatment Patient Details Name: Austin Sullivan MRN: [id number] DOB: [birth year] Today's Date: 07/21/2020    History of Present Illness Pt is a 65 yo male diagnosed with osteoarthritis of the left knee and is s/p elective L TKA.  PMH includes: SOB, asthma, COPD, HTN, DM, and skin CA.    PT Comments    Patient received in recliner, reports he has had some broth and pain medicine. Patient agreeable to PT session. He requires assistance for moving L LE due to pain. Min assist for sit to stand from recliner. Patient ambulated 40 feet with RW and min guard. Decreased step length and narrow base of support. Patient will continue to benefit from skilled PT while here to improve strength, ROM and functional independence.       Follow Up Recommendations  Home health PT;Supervision for mobility/OOB     Equipment Recommendations  None recommended by PT    Recommendations for Other Services       Precautions / Restrictions Precautions Precautions: Fall Restrictions Weight Bearing Restrictions: No LLE Weight Bearing: Weight bearing as tolerated    Mobility  Bed Mobility Overal bed mobility: Needs Assistance Bed Mobility: Sit to Supine       Sit to supine: Min assist   General bed mobility comments: Patient requires min assist to bring left LE up onto bed.    Transfers Overall transfer level: Needs assistance Equipment used: Rolling walker (2 wheeled) Transfers: Sit to/from Stand Sit to Stand: Min assist         General transfer comment: Min assist for sit to stand from recliner. Cues for scooting out to edge.  Ambulation/Gait Ambulation/Gait assistance: Min guard Gait Distance (Feet): 30 Feet Assistive device: Rolling walker (2 wheeled) Gait Pattern/deviations: Decreased step length - right;Decreased step length - left;Decreased weight shift to left;Trunk flexed;Narrow base of support Gait velocity: decreased       Stairs             Wheelchair  Mobility    Modified Rankin (Stroke Patients Only)       Balance Overall balance assessment: Needs assistance Sitting-balance support: Feet supported Sitting balance-Leahy Scale: Good     Standing balance support: Bilateral upper extremity supported;During functional activity Standing balance-Leahy Scale: Fair Standing balance comment: Min to mod lean on the RW for support                            Cognition Arousal/Alertness: Awake/alert Behavior During Therapy: WFL for tasks assessed/performed;Flat affect Overall Cognitive Status: Within Functional Limits for tasks assessed                                        Exercises Total Joint Exercises Ankle Circles/Pumps: AROM;Both;10 reps Quad Sets: AROM;Left;5 reps Hip ABduction/ADduction: AAROM;Left;10 reps Straight Leg Raises: AAROM;Left;5 reps Long Arc Quad: AROM;Left;5 reps    General Comments        Pertinent Vitals/Pain Pain Assessment: 0-10 Pain Score: 5  Pain Location: L knee with movement Pain Descriptors / Indicators: Discomfort;Sore;Tightness Pain Intervention(s): Monitored during session;Ice applied;Repositioned;Premedicated before session    Home Living                      Prior Function            PT Goals (current goals can now be found in the  care plan section) Acute Rehab PT Goals Patient Stated Goal: to feel better, decrease pain PT Goal Formulation: With patient Time For Goal Achievement: 08/02/20 Potential to Achieve Goals: Good Progress towards PT goals: Progressing toward goals    Frequency    BID      PT Plan Current plan remains appropriate    Co-evaluation              AM-PAC PT "6 Clicks" Mobility   Outcome Measure  Help needed turning from your back to your side while in a flat bed without using bedrails?: A Little Help needed moving from lying on your back to sitting on the side of a flat bed without using bedrails?: A  Little Help needed moving to and from a bed to a chair (including a wheelchair)?: A Little Help needed standing up from a chair using your arms (e.g., wheelchair or bedside chair)?: A Little Help needed to walk in hospital room?: A Little Help needed climbing 3-5 steps with a railing? : A Little 6 Click Score: 18    End of Session Equipment Utilized During Treatment: Gait belt Activity Tolerance: Patient limited by pain;Patient limited by fatigue Patient left: in bed;with call bell/phone within reach;with bed alarm set;with family/visitor present;with SCD's reapplied Nurse Communication: Mobility status PT Visit Diagnosis: Other abnormalities of gait and mobility (R26.89);Difficulty in walking, not elsewhere classified (R26.2);Muscle weakness (generalized) (M62.81);Pain Pain - Right/Left: Left Pain - part of body: Knee     Time: 1345-1405 PT Time Calculation (min) (ACUTE ONLY): 20 min  Charges:  $Gait Training: 8-22 mins                     [employer], PT, GCS 07/21/20,2:23 PM

## 2020-07-21 NOTE — Progress Notes (Signed)
Physical Therapy Treatment Patient Details Name: Austin Sullivan MRN: [id number] DOB: [birth year] Today's Date: 07/21/2020    History of Present Illness Pt is a 65 yo male diagnosed with osteoarthritis of the left knee and is s/p elective L TKA.  PMH includes: SOB, asthma, COPD, HTN, DM, and skin CA.    PT Comments    Patient reports he vomited last night and again this morning. Not feeling real well. Patient is agreeable to PT session. Reports pain increased to 6/10 this am. More soreness and discomfort today compared to yesterday. Patient performed bed exercises with min guard. Bed mobility performed with min assist and sit to stand transfer with min guard from elevated bed. Patient able to ambulate to recliner with RW and min guard. He will continue to benefit from skilled PT while here to improve functional mobility and independence, strength and ROM.       Follow Up Recommendations  Home health PT;Supervision for mobility/OOB     Equipment Recommendations  None recommended by PT    Recommendations for Other Services       Precautions / Restrictions Precautions Precautions: Fall Restrictions Weight Bearing Restrictions: No LLE Weight Bearing: Weight bearing as tolerated    Mobility  Bed Mobility Overal bed mobility: Needs Assistance Bed Mobility: Supine to Sit     Supine to sit: Min guard          Transfers Overall transfer level: Needs assistance Equipment used: Rolling walker (2 wheeled) Transfers: Sit to/from Stand Sit to Stand: Min assist;From elevated surface         General transfer comment: Patient needed bed elevated quite a bit to get to standing.  Ambulation/Gait Ambulation/Gait assistance: Min guard Gait Distance (Feet): 4 Feet Assistive device: Rolling walker (2 wheeled) Gait Pattern/deviations: Step-to pattern;Decreased step length - right;Decreased step length - left;Decreased weight shift to left;Trunk flexed Gait velocity: decreased   General  Gait Details: Patient able to ambulate from bed to recliner. Antalgic gait   Stairs             Wheelchair Mobility    Modified Rankin (Stroke Patients Only)       Balance Overall balance assessment: Needs assistance Sitting-balance support: Feet supported Sitting balance-Leahy Scale: Good     Standing balance support: Bilateral upper extremity supported;During functional activity Standing balance-Leahy Scale: Fair Standing balance comment: Min to mod lean on the RW for support                            Cognition Arousal/Alertness: Awake/alert Behavior During Therapy: WFL for tasks assessed/performed Overall Cognitive Status: Within Functional Limits for tasks assessed                                        Exercises Total Joint Exercises Ankle Circles/Pumps: AROM;Both;10 reps Quad Sets: AROM;Left;10 reps Heel Slides: AAROM;Left;10 reps Hip ABduction/ADduction: AAROM;Left;10 reps Straight Leg Raises: AAROM;Left;10 reps Goniometric ROM: 5-80    General Comments        Pertinent Vitals/Pain Pain Assessment: 0-10 Pain Score: 6  Pain Location: L knee with movement Pain Descriptors / Indicators: Discomfort;Sore Pain Intervention(s): Monitored during session;Premedicated before session;Repositioned    Home Living                      Prior Function  PT Goals (current goals can now be found in the care plan section) Acute Rehab PT Goals Patient Stated Goal: to feel better, decrease pain PT Goal Formulation: With patient Time For Goal Achievement: 08/02/20 Potential to Achieve Goals: Good Progress towards PT goals: Progressing toward goals    Frequency    BID      PT Plan Current plan remains appropriate    Co-evaluation              AM-PAC PT "6 Clicks" Mobility   Outcome Measure  Help needed turning from your back to your side while in a flat bed without using bedrails?: A Little Help  needed moving from lying on your back to sitting on the side of a flat bed without using bedrails?: A Little Help needed moving to and from a bed to a chair (including a wheelchair)?: A Little Help needed standing up from a chair using your arms (e.g., wheelchair or bedside chair)?: A Little Help needed to walk in hospital room?: A Little Help needed climbing 3-5 steps with a railing? : A Little 6 Click Score: 18    End of Session Equipment Utilized During Treatment: Gait belt Activity Tolerance: Patient tolerated treatment well;Patient limited by pain;Patient limited by fatigue Patient left: in chair;with call bell/phone within reach;with chair alarm set Nurse Communication: Mobility status PT Visit Diagnosis: Other abnormalities of gait and mobility (R26.89);Muscle weakness (generalized) (M62.81);Pain;Difficulty in walking, not elsewhere classified (R26.2) Pain - Right/Left: Left Pain - part of body: Knee     Time: 0932-6712 PT Time Calculation (min) (ACUTE ONLY): 23 min  Charges:  $Gait Training: 8-22 mins $Therapeutic Exercise: 8-22 mins                     Ying Blankenhorn, PT, GCS 07/21/20,10:10 AM

## 2020-07-21 NOTE — Progress Notes (Signed)
Pt recd reglan po at 0835 and vomited 500 cc food and liquiid at 0845 this am - repeated reglan via iv per order of [name] x 1 abd is distended   bs hyperactive x 4 q

## 2020-07-21 NOTE — Plan of Care (Signed)
  Problem: Health Behavior/Discharge Planning: Goal: Ability to manage health-related needs will improve Outcome: Progressing   Problem: Clinical Measurements: Goal: Ability to maintain clinical measurements within normal limits will improve Outcome: Progressing Goal: Will remain free from infection Outcome: Progressing   Problem: Activity: Goal: Risk for activity intolerance will decrease Outcome: Progressing   Problem: Nutrition: Goal: Adequate nutrition will be maintained Outcome: Progressing   Problem: Coping: Goal: Level of anxiety will decrease Outcome: Progressing   Problem: Education: Goal: Knowledge of the prescribed therapeutic regimen will improve Outcome: Progressing   Problem: Activity: Goal: Ability to avoid complications of mobility impairment will improve Outcome: Progressing   Problem: Pain Management: Goal: Pain level will decrease with appropriate interventions Outcome: Progressing

## 2020-07-21 NOTE — Progress Notes (Signed)
   Subjective: 1 Day Post-Op Procedure(s) (LRB): TOTAL KNEE ARTHROPLASTY - [name] to Assist (Left) Patient reports pain as mild.   Patient is well, and has had no acute complaints or problems Denies any CP, SOB, ABD pain. We will continue therapy today.  Plan is to go Home after hospital stay.  Objective: Vital signs in last 24 hours: Temp:  [96.8 F (36 C)-99.1 F (37.3 C)] 99.1 F (37.3 C) (04/20 0750) Pulse Rate:  [78-99] 98 (04/20 0750) Resp:  [18-19] 18 (04/20 0750) BP: (104-128)/(56-75) 118/57 (04/20 0750) SpO2:  [92 %-99 %] 94 % (04/20 0750)  Intake/Output from previous day: 04/19 0701 - 04/20 0700 In: 1965 [P.O.:240; I.V.:1425; IV Piggyback:300] Out: 2300 [Urine:2250; Blood:50] Intake/Output this shift: No intake/output data recorded.  Recent Labs    07/20/20 0630 07/20/20 1356 07/21/20 0601  HGB 13.3 11.9* 11.5*   Recent Labs    07/20/20 1356 07/21/20 0601  WBC 10.1 9.8  RBC 4.44 4.25  HCT 36.7* 34.3*  PLT 232 228   Recent Labs    07/20/20 0630 07/20/20 1356 07/21/20 0601  NA 139  --  137  K 3.3*  --  3.0*  CL 101  --  98  CO2  --   --  26  BUN 27*  --  20  CREATININE 1.30* 1.28* 1.05  GLUCOSE 190*  --  211*  CALCIUM  --   --  8.6*   No results for input(s): LABPT, INR in the last 72 hours.  EXAM General - Patient is Alert, Appropriate and Oriented Extremity - Neurovascular intact Sensation intact distally Intact pulses distally Dorsiflexion/Plantar flexion intact No cellulitis present Compartment soft Dressing - dressing C/D/I and no drainage, Praveena intact without drainage Motor Function - intact, moving foot and toes well on exam.   Past Medical History:  Diagnosis Date  . Arthritis   . Basal cell carcinoma 01/30/2019   Left ear, superior helix. Nodular and infiltrative patterns. Simple excision/EDC.  . Basal cell carcinoma 11/03/2019   left lat brow  . COPD (chronic obstructive pulmonary disease) ([place])   . Diabetes  mellitus without complication ([place])   . Dyspnea   . Hypertension   . Melanoma ([place]) 2001   Right forehead. Metastatic melanoma 2016  . Sleep apnea     Assessment/Plan:   1 Day Post-Op Procedure(s) (LRB): TOTAL KNEE ARTHROPLASTY - [name] to Assist (Left) Active Problems:   S/P TKR (total knee replacement) using cement, left  Estimated body mass index is 32.93 kg/m as calculated from the following:   Height as of this encounter: 6\' 1"  (1.854 m).   Weight as of this encounter: 113.2 kg. Advance diet Up with therapy  Work on bowel movement Vital signs stable Hypokalemia -potassium 3.0.  Will supplement with oral potassium Recheck labs in the morning Hemoglobin stable Pain well controlled Care manager to assist with discharge to home with home health PT  DVT Prophylaxis - Lovenox, Foot Pumps and TED hose Weight-Bearing as tolerated to left leg   T. [name], PA-C [place] 07/21/2020, 8:24 AM

## 2020-07-21 NOTE — Progress Notes (Signed)
   07/21/20 1100  Clinical Encounter Type  Visited With Patient  Visit Type Initial  Referral From Chaplain  Consult/Referral To [facility] visited [place], Pt Mr. Austin Sullivan. Pt had total knee Arthroplasty on his left knee. Pt stated, "he is not feeling well after surgery, and he was sick last night. I provided reflective listening, emotional support and wished him a successful recovery.

## 2020-07-22 LAB — GLUCOSE, CAPILLARY
Glucose-Capillary: 147 mg/dL — ABNORMAL HIGH (ref 70–99)
Glucose-Capillary: 212 mg/dL — ABNORMAL HIGH (ref 70–99)
Glucose-Capillary: 236 mg/dL — ABNORMAL HIGH (ref 70–99)
Glucose-Capillary: 209 mg/dL — ABNORMAL HIGH (ref 70–99)

## 2020-07-22 LAB — BASIC METABOLIC PANEL
Anion gap: 9 (ref 5–15)
BUN: 14 mg/dL (ref 8–23)
CO2: 27 mmol/L (ref 22–32)
Calcium: 8.3 mg/dL — ABNORMAL LOW (ref 8.9–10.3)
Chloride: 97 mmol/L — ABNORMAL LOW (ref 98–111)
Creatinine, Ser: 0.85 mg/dL (ref 0.61–1.24)
GFR, Estimated: >60 mL/min (ref >60)
Glucose, Bld: 136 mg/dL — ABNORMAL HIGH (ref 70–99)
Potassium: 3.2 mmol/L — ABNORMAL LOW (ref 3.5–5.1)
Sodium: 133 mmol/L — ABNORMAL LOW (ref 135–145)

## 2020-07-22 LAB — CBC
HCT: 30.5 % — ABNORMAL LOW (ref 39.0–52.0)
Hemoglobin: 10.1 g/dL — ABNORMAL LOW (ref 13.0–17.0)
MCH: 26.9 pg (ref 26.0–34.0)
MCHC: 33.1 g/dL (ref 30.0–36.0)
MCV: 81.1 fL (ref 80.0–100.0)
Platelets: 217 10*3/uL (ref 150–400)
RBC: 3.76 MIL/uL — ABNORMAL LOW (ref 4.22–5.81)
RDW: 17.7 % — ABNORMAL HIGH (ref 11.5–15.5)
WBC: 11 10*3/uL — ABNORMAL HIGH (ref 4.0–10.5)
nRBC: 0 % (ref 0.0–0.2)

## 2020-07-22 MED ORDER — METOCLOPRAMIDE HCL 5 MG/ML IJ SOLN: 10.0000 mg | Freq: Once | INTRAMUSCULAR | Status: DC

## 2020-07-22 MED ORDER — SODIUM CHLORIDE 0.9 % IV BOLUS
500.0000 mL | Freq: Once | INTRAVENOUS | Status: AC
  Administered 2020-07-22: 500 mL via INTRAVENOUS

## 2020-07-22 NOTE — Progress Notes (Addendum)
Patient with no bowel movement after receiving milk of mag, mag citrate, oral stool softener. Pt started vomiting post lunch. Pt vomited brown Emesis. Charge Nurse, attending PA and attending MD notified.  Pt educated and also presented with other bowel regimens such as Suppository and an enema.  Prior to proceeding to administering suppository, pt stated "I feel like I have to go to the bathroom."  Staff assisted pt from recliner chair to [facility]. PT had several extra Large bowel movements.  Pt then felt lightheadedness and also with low reading BP. Per automatic BP: 72/43 MAP53, HR:87, RR:16, O2:98% on room air, Oral temp: 98.6. Manually obtained BP: 82/42.  Per PA: Administer 0.9% sodium chloride bolus 500 cc, once.  Will administer and monitor.

## 2020-07-22 NOTE — Progress Notes (Signed)
   Subjective: 2 Days Post-Op Procedure(s) (LRB): TOTAL KNEE ARTHROPLASTY - [name] to Assist (Left) Patient reports pain as mild.   Patient is well, and has had no acute complaints or problems Denies any CP, SOB, ABD pain. We will continue therapy today.  Plan is to go Home after hospital stay.  Objective: Vital signs in last 24 hours: Temp:  [98.4 F (36.9 C)-99.1 F (37.3 C)] 99 F (37.2 C) (04/21 0449) Pulse Rate:  [87-98] 88 (04/21 0449) Resp:  [16-18] 16 (04/21 0449) BP: (118-135)/(57-74) 120/74 (04/21 0449) SpO2:  [91 %-98 %] 91 % (04/21 0449)  Intake/Output from previous day: 04/20 0701 - 04/21 0700 In: 240 [P.O.:240] Out: 1200 [Urine:1200] Intake/Output this shift: Total I/O In: -  Out: 700 [Urine:700]  Recent Labs    07/20/20 0630 07/20/20 1356 07/21/20 0601 07/22/20 0513  HGB 13.3 11.9* 11.5* 10.1*   Recent Labs    07/21/20 0601 07/22/20 0513  WBC 9.8 11.0*  RBC 4.25 3.76*  HCT 34.3* 30.5*  PLT 228 217   Recent Labs    07/20/20 0630 07/20/20 1356 07/21/20 0601  NA 139  --  137  K 3.3*  --  3.0*  CL 101  --  98  CO2  --   --  26  BUN 27*  --  20  CREATININE 1.30* 1.28* 1.05  GLUCOSE 190*  --  211*  CALCIUM  --   --  8.6*   No results for input(s): LABPT, INR in the last 72 hours.  EXAM General - Patient is Alert, Appropriate and Oriented Extremity - Neurovascular intact Sensation intact distally Intact pulses distally Dorsiflexion/Plantar flexion intact No cellulitis present Compartment soft Dressing - dressing C/D/I and no drainage, Praveena intact without drainage Motor Function - intact, moving foot and toes well on exam.  The patient ambulated 40 feet with physical therapy.  Past Medical History:  Diagnosis Date  . Arthritis   . Basal cell carcinoma 01/30/2019   Left ear, superior helix. Nodular and infiltrative patterns. Simple excision/EDC.  . Basal cell carcinoma 11/03/2019   left lat brow  . COPD (chronic  obstructive pulmonary disease) ([place])   . Diabetes mellitus without complication ([place])   . Dyspnea   . Hypertension   . Melanoma ([place]) 2001   Right forehead. Metastatic melanoma 2016  . Sleep apnea     Assessment/Plan:   2 Days Post-Op Procedure(s) (LRB): TOTAL KNEE ARTHROPLASTY - [name] to Assist (Left) Active Problems:   S/P TKR (total knee replacement) using cement, left  Estimated body mass index is 32.93 kg/m as calculated from the following:   Height as of this encounter: 6\' 1"  (1.854 m).   Weight as of this encounter: 113.2 kg. Advance diet Up with therapy  Work on bowel movement Vital signs stable Hypokalemia -potassium 3.0.  Will supplement with oral potassium Hemoglobin stable Pain well controlled Care manager to assist with discharge to home with home health PT.  Plan for discharge home today, unless unable to progress with physical therapy.  DVT Prophylaxis - Lovenox, Foot Pumps and TED hose Weight-Bearing as tolerated to left leg   Reche Dixon, PA-C [place] 07/22/2020, 6:27 AM

## 2020-07-22 NOTE — Progress Notes (Signed)
Physical Therapy Treatment Patient Details Name: Austin Sullivan MRN: [id number] DOB: 1955-10-17 Today's Date: 07/22/2020    History of Present Illness Pt is a 65 yo male diagnosed with osteoarthritis of the left knee and is s/p elective L TKA.  PMH includes: SOB, asthma, COPD, HTN, DM, and skin CA.    PT Comments    Pt was long sitting in bed with supportive spouse at bedside. He agrees to session and is cooperative and pleasant throughout. Was able to exit L side of bed, stand, and ambulate with RW. No LOB or unsteadiness but pt has to take a lot of standing rest breaks due to fatigue. He safely performed ascending/descending step with CGA. Both pt and pt's spouse feel confident in pt's abilities to safely DC home. Reviewed positioning, HEP, polar care, and what to expect going forward. Pt is cleared from PT standpoint to DC home with HHPT to follow.    Follow Up Recommendations  Home health PT;Supervision for mobility/OOB     Equipment Recommendations  None recommended by PT       Precautions / Restrictions Precautions Precautions: Fall Restrictions Weight Bearing Restrictions: Yes LLE Weight Bearing: Weight bearing as tolerated    Mobility  Bed Mobility Overal bed mobility: Needs Assistance Bed Mobility: Supine to Sit     Supine to sit: Min assist     General bed mobility comments: min assist more so due to pain than physical limitations    Transfers Overall transfer level: Needs assistance Equipment used: Rolling walker (2 wheeled) Transfers: Sit to/from Stand Sit to Stand: Min guard         General transfer comment: CGA for safety. Stood from elevated bed height to simulate home situation  Ambulation/Gait Ambulation/Gait assistance: Min guard Gait Distance (Feet): 160 Feet Assistive device: Rolling walker (2 wheeled) Gait Pattern/deviations: Step-to pattern;Antalgic;Trunk flexed Gait velocity: decreased   General Gait Details: Pt was able to ambulate 2 x ~  160 ft with RW. CGA however no LOB or unsteadiness. spouse present throughout session and will be assisting pt at home.   Stairs Stairs: Yes Stairs assistance: Min guard Stair Management: No rails;Forwards;With walker;Step to pattern Number of Stairs: 1 General stair comments: pt was easily able to ascend/descend 1 step with use of RW. no difficulty or safety concerns.      Balance Overall balance assessment: Needs assistance Sitting-balance support: Feet supported Sitting balance-Leahy Scale: Good     Standing balance support: Bilateral upper extremity supported;During functional activity Standing balance-Leahy Scale: Fair       Cognition Arousal/Alertness: Awake/alert Behavior During Therapy: WFL for tasks assessed/performed Overall Cognitive Status: Within Functional Limits for tasks assessed              Pertinent Vitals/Pain Pain Assessment: 0-10 Pain Score: 7  Faces Pain Scale: Hurts even more Pain Location: L knee with movement Pain Descriptors / Indicators: Discomfort;Sore;Tightness Pain Intervention(s): Limited activity within patient's tolerance;Monitored during session;Premedicated before session;Repositioned           PT Goals (current goals can now be found in the care plan section) Acute Rehab PT Goals Patient Stated Goal: home Progress towards PT goals: Progressing toward goals    Frequency    BID      PT Plan Current plan remains appropriate       AM-PAC PT "6 Clicks" Mobility   Outcome Measure  Help needed turning from your back to your side while in a flat bed without using bedrails?: A Little Help needed  moving from lying on your back to sitting on the side of a flat bed without using bedrails?: A Little Help needed moving to and from a bed to a chair (including a wheelchair)?: A Little Help needed standing up from a chair using your arms (e.g., wheelchair or bedside chair)?: A Little Help needed to walk in hospital room?: A  Little Help needed climbing 3-5 steps with a railing? : A Little 6 Click Score: 18    End of Session Equipment Utilized During Treatment: Gait belt Activity Tolerance: Patient tolerated treatment well;Patient limited by fatigue Patient left: with call bell/phone within reach;in chair;with chair alarm set;with nursing/sitter in room Nurse Communication: Mobility status PT Visit Diagnosis: Other abnormalities of gait and mobility (R26.89);Difficulty in walking, not elsewhere classified (R26.2);Muscle weakness (generalized) (M62.81);Pain Pain - Right/Left: Left Pain - part of body: Knee     Time: 6681-5947 PT Time Calculation (min) (ACUTE ONLY): 39 min  Charges:  $Gait Training: 23-37 mins $Therapeutic Activity: 8-22 mins                     Julaine Fusi PTA 07/22/20, 12:40 PM

## 2020-07-22 NOTE — Anesthesia Postprocedure Evaluation (Signed)
Anesthesia Post Note  Patient: Austin Sullivan  Procedure(s) Performed: TOTAL KNEE ARTHROPLASTY - [name] to Assist (Left Knee)  Patient location during evaluation: PACU Anesthesia Type: Combined General/Spinal Level of consciousness: oriented and awake and alert Pain management: pain level controlled Vital Signs Assessment: post-procedure vital signs reviewed and stable Respiratory status: spontaneous breathing, respiratory function stable and patient connected to nasal cannula oxygen Cardiovascular status: blood pressure returned to baseline and stable Postop Assessment: no headache, no backache, no apparent nausea or vomiting and spinal receding Anesthetic complications: no   No complications documented.   Last Vitals:  Vitals:   07/22/20 0449 07/22/20 0749  BP: 120/74 130/77  Pulse: 88 88  Resp: 16 16  Temp: 37.2 C 36.9 C  SpO2: 91% 94%    Last Pain:  Vitals:   07/22/20 0449  TempSrc: Oral  PainSc:                  [name]

## 2020-07-23 LAB — GLUCOSE, CAPILLARY
Glucose-Capillary: 156 mg/dL — ABNORMAL HIGH (ref 70–99)
Glucose-Capillary: 158 mg/dL — ABNORMAL HIGH (ref 70–99)

## 2020-07-23 NOTE — Progress Notes (Signed)
D/c education given to pt, all questions answered. [place] changed to [place]. Pt given hard script for colace, tramadol, oxy, and lovanox. Pt has all belongings. Pt Iv removed. Pt awaiting ride home.

## 2020-07-23 NOTE — Progress Notes (Signed)
Subjective: 3 Days Post-Op Procedure(s) (LRB): TOTAL KNEE ARTHROPLASTY - [name] to Assist (Left) Patient reports pain as mild.   Patient is well, and has had no acute complaints or problems Denies any CP, SOB, ABD pain. We will continue therapy today.  Plan is to go Home after hospital stay. Patient had a very large BM yesterday, became hypotensive afterwards.  IVF given and patient has significantly improved.  Objective: Vital signs in last 24 hours: Temp:  [97.4 F (36.3 C)-99.4 F (37.4 C)] 98.7 F (37.1 C) (04/22 0409) Pulse Rate:  [78-94] 78 (04/22 0409) Resp:  [15-17] 16 (04/22 0409) BP: (101-130)/(50-77) 117/61 (04/22 0409) SpO2:  [94 %-97 %] 95 % (04/22 0409)  Intake/Output from previous day: 04/21 0701 - 04/22 0700 In: 720 [P.O.:720] Out: 1605 [Urine:1525; Emesis/NG output:80] Intake/Output this shift: No intake/output data recorded.  Recent Labs    07/20/20 1356 07/21/20 0601 07/22/20 0513  HGB 11.9* 11.5* 10.1*   Recent Labs    07/21/20 0601 07/22/20 0513  WBC 9.8 11.0*  RBC 4.25 3.76*  HCT 34.3* 30.5*  PLT 228 217   Recent Labs    07/21/20 0601 07/22/20 0513  NA 137 133*  K 3.0* 3.2*  CL 98 97*  CO2 26 27  BUN 20 14  CREATININE 1.05 0.85  GLUCOSE 211* 136*  CALCIUM 8.6* 8.3*   No results for input(s): LABPT, INR in the last 72 hours.  EXAM General - Patient is Alert, Appropriate and Oriented Extremity - Neurovascular intact Sensation intact distally Intact pulses distally Dorsiflexion/Plantar flexion intact No cellulitis present Compartment soft Dressing - dressing C/D/I and no drainage, Praveena intact without drainage Motor Function - intact, moving foot and toes well on exam.   Abdomen is soft and non-tender to palpation.  Past Medical History:  Diagnosis Date  . Arthritis   . Basal cell carcinoma 01/30/2019   Left ear, superior helix. Nodular and infiltrative patterns. Simple excision/EDC.  . Basal cell carcinoma  11/03/2019   left lat brow  . COPD (chronic obstructive pulmonary disease) ([place])   . Diabetes mellitus without complication ([place])   . Dyspnea   . Hypertension   . Melanoma ([place]) 2001   Right forehead. Metastatic melanoma 2016  . Sleep apnea     Assessment/Plan:   3 Days Post-Op Procedure(s) (LRB): TOTAL KNEE ARTHROPLASTY - [name] to Assist (Left) Active Problems:   S/P TKR (total knee replacement) using cement, left  Estimated body mass index is 32.93 kg/m as calculated from the following:   Height as of this encounter: 6\' 1"  (1.854 m).   Weight as of this encounter: 113.2 kg. Advance diet Up with therapy   Patient had a large BM yesterday. BP has improved. Pain is well controlled. Up with PT this AM. Plan for discharge home this afternoon.  DVT Prophylaxis - Lovenox, Foot Pumps and TED hose Weight-Bearing as tolerated to left leg  J. Cameron Proud, PA-C [place] 07/23/2020, 7:33 AM

## 2020-07-23 NOTE — Care Management Important Message (Signed)
Important Message  Patient Details  Name: Austin Sullivan MRN: [id number] Date of Birth: 1955-11-27   Medicare Important Message Given:  Yes     [name] 07/23/2020, 9:19 AM

## 2020-07-26 ENCOUNTER — Emergency Department: Payer: Medicare Other

## 2020-07-26 ENCOUNTER — Encounter
Admission: EM | Disposition: A | Discharge status: Home Health | Payer: Self-pay | Source: Home / Self Care | Attending: Internal Medicine

## 2020-07-26 ENCOUNTER — Observation Stay: Payer: Medicare Other

## 2020-07-26 ENCOUNTER — Observation Stay: Payer: Medicare Other | Admitting: Anesthesiology

## 2020-07-26 ENCOUNTER — Encounter: Payer: Self-pay | Admitting: Internal Medicine

## 2020-07-26 ENCOUNTER — Inpatient Hospital Stay
Admission: EM | Admit: 2020-07-26 | Discharge: 2020-07-31 | DRG: 854 | Disposition: A | Discharge status: Home Health | Payer: Medicare Other | Attending: Internal Medicine | Admitting: Internal Medicine

## 2020-07-26 DIAGNOSIS — G629 Polyneuropathy, unspecified: Secondary | ICD-10-CM | POA: Diagnosis present

## 2020-07-26 DIAGNOSIS — J449 Chronic obstructive pulmonary disease, unspecified: Secondary | ICD-10-CM | POA: Diagnosis present

## 2020-07-26 DIAGNOSIS — E1122 Type 2 diabetes mellitus with diabetic chronic kidney disease: Secondary | ICD-10-CM | POA: Diagnosis present

## 2020-07-26 DIAGNOSIS — Z85820 Personal history of malignant melanoma of skin: Secondary | ICD-10-CM | POA: Diagnosis not present

## 2020-07-26 DIAGNOSIS — R140 Abdominal distension (gaseous): Secondary | ICD-10-CM

## 2020-07-26 DIAGNOSIS — Z96652 Presence of left artificial knee joint: Secondary | ICD-10-CM

## 2020-07-26 DIAGNOSIS — N136 Pyonephrosis: Secondary | ICD-10-CM | POA: Diagnosis present

## 2020-07-26 DIAGNOSIS — Z87891 Personal history of nicotine dependence: Secondary | ICD-10-CM

## 2020-07-26 DIAGNOSIS — A419 Sepsis, unspecified organism: Secondary | ICD-10-CM | POA: Diagnosis present

## 2020-07-26 DIAGNOSIS — N200 Calculus of kidney: Secondary | ICD-10-CM

## 2020-07-26 DIAGNOSIS — I129 Hypertensive chronic kidney disease with stage 1 through stage 4 chronic kidney disease, or unspecified chronic kidney disease: Secondary | ICD-10-CM | POA: Diagnosis present

## 2020-07-26 DIAGNOSIS — E785 Hyperlipidemia, unspecified: Secondary | ICD-10-CM | POA: Diagnosis present

## 2020-07-26 DIAGNOSIS — Z91030 Bee allergy status: Secondary | ICD-10-CM | POA: Diagnosis not present

## 2020-07-26 DIAGNOSIS — N179 Acute kidney failure, unspecified: Secondary | ICD-10-CM | POA: Diagnosis present

## 2020-07-26 DIAGNOSIS — Z8571 Personal history of Hodgkin lymphoma: Secondary | ICD-10-CM

## 2020-07-26 DIAGNOSIS — Z96642 Presence of left artificial hip joint: Secondary | ICD-10-CM | POA: Diagnosis present

## 2020-07-26 DIAGNOSIS — I959 Hypotension, unspecified: Principal | ICD-10-CM | POA: Diagnosis present

## 2020-07-26 DIAGNOSIS — Z20822 Contact with and (suspected) exposure to covid-19: Secondary | ICD-10-CM | POA: Diagnosis present

## 2020-07-26 DIAGNOSIS — Z79899 Other long term (current) drug therapy: Secondary | ICD-10-CM | POA: Diagnosis not present

## 2020-07-26 DIAGNOSIS — N182 Chronic kidney disease, stage 2 (mild): Secondary | ICD-10-CM | POA: Diagnosis present

## 2020-07-26 DIAGNOSIS — N12 Tubulo-interstitial nephritis, not specified as acute or chronic: Secondary | ICD-10-CM

## 2020-07-26 DIAGNOSIS — Z4659 Encounter for fitting and adjustment of other gastrointestinal appliance and device: Secondary | ICD-10-CM

## 2020-07-26 DIAGNOSIS — E876 Hypokalemia: Secondary | ICD-10-CM | POA: Diagnosis not present

## 2020-07-26 DIAGNOSIS — G8929 Other chronic pain: Secondary | ICD-10-CM | POA: Diagnosis present

## 2020-07-26 DIAGNOSIS — R6520 Severe sepsis without septic shock: Secondary | ICD-10-CM

## 2020-07-26 DIAGNOSIS — K567 Ileus, unspecified: Secondary | ICD-10-CM

## 2020-07-26 DIAGNOSIS — E669 Obesity, unspecified: Secondary | ICD-10-CM | POA: Diagnosis present

## 2020-07-26 DIAGNOSIS — Z7984 Long term (current) use of oral hypoglycemic drugs: Secondary | ICD-10-CM

## 2020-07-26 DIAGNOSIS — Z6832 Body mass index (BMI) 32.0-32.9, adult: Secondary | ICD-10-CM

## 2020-07-26 DIAGNOSIS — Z888 Allergy status to other drugs, medicaments and biological substances status: Secondary | ICD-10-CM | POA: Diagnosis not present

## 2020-07-26 DIAGNOSIS — E871 Hypo-osmolality and hyponatremia: Secondary | ICD-10-CM | POA: Diagnosis present

## 2020-07-26 DIAGNOSIS — I10 Essential (primary) hypertension: Secondary | ICD-10-CM

## 2020-07-26 DIAGNOSIS — R600 Localized edema: Secondary | ICD-10-CM

## 2020-07-26 DIAGNOSIS — N1330 Unspecified hydronephrosis: Secondary | ICD-10-CM | POA: Diagnosis not present

## 2020-07-26 DIAGNOSIS — G4733 Obstructive sleep apnea (adult) (pediatric): Secondary | ICD-10-CM | POA: Diagnosis present

## 2020-07-26 DIAGNOSIS — Z7951 Long term (current) use of inhaled steroids: Secondary | ICD-10-CM | POA: Diagnosis not present

## 2020-07-26 DIAGNOSIS — Z9221 Personal history of antineoplastic chemotherapy: Secondary | ICD-10-CM

## 2020-07-26 HISTORY — PX: CYSTOSCOPY WITH STENT PLACEMENT: SHX5790

## 2020-07-26 LAB — COMPREHENSIVE METABOLIC PANEL
ALT: 25 U/L (ref 0–44)
AST: 26 U/L (ref 15–41)
Albumin: 2.8 g/dL — ABNORMAL LOW (ref 3.5–5.0)
Alkaline Phosphatase: 63 U/L (ref 38–126)
Anion gap: 15 (ref 5–15)
BUN: 37 mg/dL — ABNORMAL HIGH (ref 8–23)
CO2: 25 mmol/L (ref 22–32)
Calcium: 8.6 mg/dL — ABNORMAL LOW (ref 8.9–10.3)
Chloride: 91 mmol/L — ABNORMAL LOW (ref 98–111)
Creatinine, Ser: 2.64 mg/dL — ABNORMAL HIGH (ref 0.61–1.24)
GFR, Estimated: 26 mL/min — ABNORMAL LOW (ref >60)
Glucose, Bld: 197 mg/dL — ABNORMAL HIGH (ref 70–99)
Potassium: 3.4 mmol/L — ABNORMAL LOW (ref 3.5–5.1)
Sodium: 131 mmol/L — ABNORMAL LOW (ref 135–145)
Total Bilirubin: 0.9 mg/dL (ref 0.3–1.2)
Total Protein: 6.5 g/dL (ref 6.5–8.1)

## 2020-07-26 LAB — URINALYSIS, COMPLETE (UACMP) WITH MICROSCOPIC
Bilirubin Urine: NEGATIVE
Glucose, UA: NEGATIVE mg/dL
Ketones, ur: NEGATIVE mg/dL
Nitrite: NEGATIVE
Protein, ur: 100 mg/dL — AB
Specific Gravity, Urine: 1.014 (ref 1.005–1.030)
pH: 5 (ref 5.0–8.0)

## 2020-07-26 LAB — CBC WITH DIFFERENTIAL/PLATELET
Abs Immature Granulocytes: 0.36 10*3/uL — ABNORMAL HIGH (ref 0.00–0.07)
Basophils Absolute: 0 10*3/uL (ref 0.0–0.1)
Basophils Relative: 0 %
Eosinophils Absolute: 0.1 10*3/uL (ref 0.0–0.5)
Eosinophils Relative: 0 %
HCT: 29.5 % — ABNORMAL LOW (ref 39.0–52.0)
Hemoglobin: 9.8 g/dL — ABNORMAL LOW (ref 13.0–17.0)
Immature Granulocytes: 2 %
Lymphocytes Relative: 2 %
Lymphs Abs: 0.5 10*3/uL — ABNORMAL LOW (ref 0.7–4.0)
MCH: 27.1 pg (ref 26.0–34.0)
MCHC: 33.2 g/dL (ref 30.0–36.0)
MCV: 81.7 fL (ref 80.0–100.0)
Monocytes Absolute: 0.8 10*3/uL (ref 0.1–1.0)
Monocytes Relative: 4 %
Neutro Abs: 20.2 10*3/uL — ABNORMAL HIGH (ref 1.7–7.7)
Neutrophils Relative %: 92 %
Platelets: 313 10*3/uL (ref 150–400)
RBC: 3.61 MIL/uL — ABNORMAL LOW (ref 4.22–5.81)
RDW: 17.5 % — ABNORMAL HIGH (ref 11.5–15.5)
WBC: 21.9 10*3/uL — ABNORMAL HIGH (ref 4.0–10.5)
nRBC: 0 % (ref 0.0–0.2)

## 2020-07-26 LAB — PROTIME-INR
INR: 1.2 (ref 0.8–1.2)
Prothrombin Time: 15 seconds (ref 11.4–15.2)

## 2020-07-26 LAB — RESP PANEL BY RT-PCR (FLU A&B, COVID) ARPGX2
Influenza A by PCR: NEGATIVE
Influenza B by PCR: NEGATIVE
SARS Coronavirus 2 by RT PCR: NEGATIVE

## 2020-07-26 LAB — LACTIC ACID, PLASMA
Lactic Acid, Venous: 3.1 mmol/L (ref 0.5–1.9)
Lactic Acid, Venous: 2.4 mmol/L (ref 0.5–1.9)

## 2020-07-26 LAB — APTT: aPTT: 34 seconds (ref 24–36)

## 2020-07-26 LAB — CBG MONITORING, ED: Glucose-Capillary: 118 mg/dL — ABNORMAL HIGH (ref 70–99)

## 2020-07-26 SURGERY — CYSTOSCOPY, WITH STENT INSERTION
Anesthesia: General | Laterality: Bilateral

## 2020-07-26 MED ORDER — ONDANSETRON HCL 4 MG/2ML IJ SOLN
INTRAMUSCULAR | Status: AC
  Filled 2020-07-26: qty 2

## 2020-07-26 MED ORDER — TRAMADOL HCL 50 MG PO TABS
50.0000 mg | ORAL_TABLET | Freq: Four times a day (QID) | ORAL | Status: DC | PRN
  Administered 2020-07-27: 50 mg via ORAL
  Filled 2020-07-26: qty 1

## 2020-07-26 MED ORDER — DEXAMETHASONE SODIUM PHOSPHATE 10 MG/ML IJ SOLN
INTRAMUSCULAR | Status: AC
  Filled 2020-07-26: qty 1

## 2020-07-26 MED ORDER — FLUTICASONE FUROATE-VILANTEROL 100-25 MCG/INH IN AEPB
1.0000 | INHALATION_SPRAY | Freq: Every day | RESPIRATORY_TRACT | Status: DC
  Administered 2020-07-27 – 2020-07-31 (×4): 1 via RESPIRATORY_TRACT
  Filled 2020-07-26: qty 28

## 2020-07-26 MED ORDER — ONDANSETRON HCL 4 MG PO TABS: 4.0000 mg | ORAL_TABLET | Freq: Four times a day (QID) | ORAL | Status: DC | PRN

## 2020-07-26 MED ORDER — ROSUVASTATIN CALCIUM 10 MG PO TABS
40.0000 mg | ORAL_TABLET | Freq: Every day | ORAL | Status: DC
  Administered 2020-07-27 – 2020-07-29 (×4): 40 mg via ORAL
  Filled 2020-07-26: qty 2
  Filled 2020-07-26 (×3): qty 4
  Filled 2020-07-26: qty 2

## 2020-07-26 MED ORDER — SUGAMMADEX SODIUM 200 MG/2ML IV SOLN
INTRAVENOUS | Status: DC | PRN
  Administered 2020-07-26: 200 mg via INTRAVENOUS

## 2020-07-26 MED ORDER — OXYCODONE HCL 5 MG PO TABS: 5.0000 mg | ORAL_TABLET | Freq: Once | ORAL | Status: DC | PRN

## 2020-07-26 MED ORDER — METHYLPREDNISOLONE SODIUM SUCC 40 MG IJ SOLR
40.0000 mg | Freq: Two times a day (BID) | INTRAMUSCULAR | Status: AC
  Administered 2020-07-27: 40 mg via INTRAVENOUS
  Filled 2020-07-26: qty 1

## 2020-07-26 MED ORDER — ONDANSETRON HCL 4 MG/2ML IJ SOLN
4.0000 mg | Freq: Four times a day (QID) | INTRAMUSCULAR | Status: DC | PRN
  Administered 2020-07-27 – 2020-07-29 (×2): 4 mg via INTRAVENOUS
  Filled 2020-07-26 (×2): qty 2

## 2020-07-26 MED ORDER — FENTANYL CITRATE (PF) 100 MCG/2ML IJ SOLN
INTRAMUSCULAR | Status: DC | PRN
  Administered 2020-07-26 (×2): 50 ug via INTRAVENOUS

## 2020-07-26 MED ORDER — LACTATED RINGERS IV BOLUS (SEPSIS)
2000.0000 mL | Freq: Once | INTRAVENOUS | Status: AC
  Administered 2020-07-26: 2000 mL via INTRAVENOUS

## 2020-07-26 MED ORDER — LIDOCAINE HCL (PF) 2 % IJ SOLN
INTRAMUSCULAR | Status: AC
  Filled 2020-07-26: qty 5

## 2020-07-26 MED ORDER — INSULIN ASPART 100 UNIT/ML ~~LOC~~ SOLN
0.0000 [IU] | Freq: Three times a day (TID) | SUBCUTANEOUS | Status: DC
  Administered 2020-07-27 – 2020-07-28 (×4): 4 [IU] via SUBCUTANEOUS
  Administered 2020-07-28 – 2020-07-29 (×3): 3 [IU] via SUBCUTANEOUS
  Administered 2020-07-30: 4 [IU] via SUBCUTANEOUS
  Administered 2020-07-31: 7 [IU] via SUBCUTANEOUS
  Administered 2020-07-31: 3 [IU] via SUBCUTANEOUS
  Filled 2020-07-26 (×10): qty 1

## 2020-07-26 MED ORDER — ACETAMINOPHEN 325 MG PO TABS: 650.0000 mg | ORAL_TABLET | Freq: Four times a day (QID) | ORAL | Status: DC | PRN

## 2020-07-26 MED ORDER — SODIUM CHLORIDE 0.9 % IV SOLN
INTRAVENOUS | Status: DC
  Administered 2020-07-27: 100 mL/h via INTRAVENOUS

## 2020-07-26 MED ORDER — ROCURONIUM BROMIDE 10 MG/ML (PF) SYRINGE
PREFILLED_SYRINGE | INTRAVENOUS | Status: AC
  Filled 2020-07-26: qty 10

## 2020-07-26 MED ORDER — INSULIN ASPART 100 UNIT/ML ~~LOC~~ SOLN
0.0000 [IU] | Freq: Every day | SUBCUTANEOUS | Status: DC
  Administered 2020-07-27: 2 [IU] via SUBCUTANEOUS
  Filled 2020-07-26: qty 1

## 2020-07-26 MED ORDER — PIPERACILLIN-TAZOBACTAM 3.375 G IVPB 30 MIN
3.3750 g | INTRAVENOUS | Status: AC
  Administered 2020-07-26: 3.375 g via INTRAVENOUS
  Filled 2020-07-26: qty 50

## 2020-07-26 MED ORDER — DIPHENHYDRAMINE HCL 50 MG/ML IJ SOLN: 25.0000 mg | INTRAMUSCULAR | Status: DC | PRN

## 2020-07-26 MED ORDER — LIDOCAINE HCL (CARDIAC) PF 100 MG/5ML IV SOSY
PREFILLED_SYRINGE | INTRAVENOUS | Status: DC | PRN
  Administered 2020-07-26: 100 mg via INTRAVENOUS

## 2020-07-26 MED ORDER — OXYCODONE HCL 5 MG/5ML PO SOLN: 5.0000 mg | Freq: Once | ORAL | Status: DC | PRN

## 2020-07-26 MED ORDER — SODIUM CHLORIDE 0.9 % IV BOLUS
393.0000 mL | Freq: Once | INTRAVENOUS | Status: AC
  Administered 2020-07-26: 393 mL via INTRAVENOUS

## 2020-07-26 MED ORDER — PHENYLEPHRINE HCL (PRESSORS) 10 MG/ML IV SOLN
INTRAVENOUS | Status: DC | PRN
  Administered 2020-07-26 (×4): 100 ug via INTRAVENOUS
  Administered 2020-07-26: 200 ug via INTRAVENOUS
  Administered 2020-07-26: 100 ug via INTRAVENOUS

## 2020-07-26 MED ORDER — IOHEXOL 180 MG/ML  SOLN
INTRAMUSCULAR | Status: DC | PRN
  Administered 2020-07-26: 5 mL

## 2020-07-26 MED ORDER — DEXAMETHASONE SODIUM PHOSPHATE 10 MG/ML IJ SOLN
INTRAMUSCULAR | Status: DC | PRN
  Administered 2020-07-26: 5 mg via INTRAVENOUS

## 2020-07-26 MED ORDER — HYDRALAZINE HCL 25 MG PO TABS
25.0000 mg | ORAL_TABLET | Freq: Three times a day (TID) | ORAL | Status: DC | PRN
  Filled 2020-07-26: qty 1

## 2020-07-26 MED ORDER — IPRATROPIUM BROMIDE 0.06 % NA SOLN
2.0000 | Freq: Two times a day (BID) | NASAL | Status: DC
  Administered 2020-07-27 – 2020-07-31 (×10): 2 via NASAL
  Filled 2020-07-26: qty 15

## 2020-07-26 MED ORDER — AZELASTINE HCL 0.1 % NA SOLN
1.0000 | Freq: Every day | NASAL | Status: DC
  Administered 2020-07-27 – 2020-07-30 (×5): 1 via NASAL
  Filled 2020-07-26: qty 30

## 2020-07-26 MED ORDER — ETOMIDATE 2 MG/ML IV SOLN
INTRAVENOUS | Status: AC
  Filled 2020-07-26: qty 10

## 2020-07-26 MED ORDER — ENOXAPARIN SODIUM 40 MG/0.4ML ~~LOC~~ SOLN
0.5000 mg/kg | SUBCUTANEOUS | Status: DC
  Administered 2020-07-27 – 2020-07-30 (×4): 57.5 mg via SUBCUTANEOUS
  Filled 2020-07-26 (×4): qty 0.8

## 2020-07-26 MED ORDER — MONTELUKAST SODIUM 10 MG PO TABS
10.0000 mg | ORAL_TABLET | Freq: Every day | ORAL | Status: DC
  Administered 2020-07-27 – 2020-07-28 (×3): 10 mg via ORAL
  Filled 2020-07-26 (×3): qty 1

## 2020-07-26 MED ORDER — VANCOMYCIN HCL IN DEXTROSE 1-5 GM/200ML-% IV SOLN
1000.0000 mg | INTRAVENOUS | Status: AC
  Administered 2020-07-26: 1000 mg via INTRAVENOUS
  Filled 2020-07-26: qty 200

## 2020-07-26 MED ORDER — ONDANSETRON HCL 4 MG/2ML IJ SOLN
INTRAMUSCULAR | Status: DC | PRN
  Administered 2020-07-26: 4 mg via INTRAVENOUS

## 2020-07-26 MED ORDER — ETOMIDATE 2 MG/ML IV SOLN
INTRAVENOUS | Status: DC | PRN
  Administered 2020-07-26: 20 mg via INTRAVENOUS

## 2020-07-26 MED ORDER — FENTANYL CITRATE (PF) 100 MCG/2ML IJ SOLN
INTRAMUSCULAR | Status: AC
  Filled 2020-07-26: qty 2

## 2020-07-26 MED ORDER — FENTANYL CITRATE (PF) 100 MCG/2ML IJ SOLN
25.0000 ug | INTRAMUSCULAR | Status: DC | PRN
Start: 2020-07-26 — End: 2020-07-27

## 2020-07-26 MED ORDER — FLUTICASONE PROPIONATE 50 MCG/ACT NA SUSP
2.0000 | Freq: Every day | NASAL | Status: DC
  Administered 2020-07-27 – 2020-07-31 (×5): 2 via NASAL
  Filled 2020-07-26: qty 16

## 2020-07-26 MED ORDER — SODIUM CHLORIDE 0.9 % IV SOLN
2.0000 g | INTRAVENOUS | Status: DC
  Administered 2020-07-27 – 2020-07-31 (×5): 2 g via INTRAVENOUS
  Filled 2020-07-26: qty 20
  Filled 2020-07-26 (×3): qty 2
  Filled 2020-07-26 (×2): qty 20
  Filled 2020-07-26: qty 2

## 2020-07-26 MED ORDER — SODIUM CHLORIDE 0.9 % IV BOLUS
1000.0000 mL | Freq: Once | INTRAVENOUS | Status: AC
  Administered 2020-07-26: 1000 mL via INTRAVENOUS

## 2020-07-26 MED ORDER — SUCCINYLCHOLINE CHLORIDE 20 MG/ML IJ SOLN
INTRAMUSCULAR | Status: DC | PRN
  Administered 2020-07-26: 200 mg via INTRAVENOUS

## 2020-07-26 MED ORDER — ACETAMINOPHEN 650 MG RE SUPP: 650.0000 mg | Freq: Four times a day (QID) | RECTAL | Status: DC | PRN

## 2020-07-26 MED ORDER — ROCURONIUM BROMIDE 100 MG/10ML IV SOLN
INTRAVENOUS | Status: DC | PRN
  Administered 2020-07-26: 20 mg via INTRAVENOUS

## 2020-07-26 MED ORDER — SUCCINYLCHOLINE CHLORIDE 200 MG/10ML IV SOSY
PREFILLED_SYRINGE | INTRAVENOUS | Status: AC
  Filled 2020-07-26: qty 10

## 2020-07-26 SURGICAL SUPPLY — 25 items
BAG DRAIN CYSTO-URO LG1000N (MISCELLANEOUS) ×2 IMPLANT
BAG URINE DRAIN 2000ML AR STRL (UROLOGICAL SUPPLIES) ×2 IMPLANT
BRUSH SCRUB EZ 1% IODOPHOR (MISCELLANEOUS) IMPLANT
CATH FOLEY 2WAY 18X30 (CATHETERS) ×1 IMPLANT
CATH FOLEY 2WAY SIL 18X30 (CATHETERS) ×2
CATH URETL 5X70 OPEN END (CATHETERS) ×2 IMPLANT
GLOVE SURG UNDER POLY LF SZ7.5 (GLOVE) ×8 IMPLANT
GOWN STRL REUS W/ TWL LRG LVL3 (GOWN DISPOSABLE) ×1 IMPLANT
GOWN STRL REUS W/ TWL XL LVL3 (GOWN DISPOSABLE) ×1 IMPLANT
GOWN STRL REUS W/TWL LRG LVL3 (GOWN DISPOSABLE) ×1
GOWN STRL REUS W/TWL XL LVL3 (GOWN DISPOSABLE) ×1
GUIDEWIRE STR DUAL SENSOR (WIRE) ×2 IMPLANT
HOLDER FOLEY CATH W/STRAP (MISCELLANEOUS) ×2 IMPLANT
IV NS IRRIG 3000ML ARTHROMATIC (IV SOLUTION) ×2 IMPLANT
KIT TURNOVER CYSTO (KITS) ×2 IMPLANT
MANIFOLD NEPTUNE II (INSTRUMENTS) IMPLANT
PACK CYSTO AR (MISCELLANEOUS) ×2 IMPLANT
SET CYSTO W/LG BORE CLAMP LF (SET/KITS/TRAYS/PACK) ×2 IMPLANT
STENT URET 6FRX24 CONTOUR (STENTS) IMPLANT
STENT URET 6FRX26 CONTOUR (STENTS) ×2 IMPLANT
STENT URET 6FRX28 CONTOUR (STENTS) ×2 IMPLANT
SURGILUBE 2OZ TUBE FLIPTOP (MISCELLANEOUS) ×2 IMPLANT
SYR TOOMEY IRRIG 70ML (MISCELLANEOUS)
SYRINGE TOOMEY IRRIG 70ML (MISCELLANEOUS) IMPLANT
WATER STERILE IRR 1000ML POUR (IV SOLUTION) ×2 IMPLANT

## 2020-07-26 NOTE — ED Provider Notes (Signed)
[facility] Emergency Department Provider Note  ____________________________________________   Event Date/Time   First MD Initiated Contact with Patient 07/26/20 1808     (approximate)  I have reviewed the triage vital signs and the nursing notes.   HISTORY  Chief Complaint Hypotension    HPI Austin Sullivan is a 65 y.o. male with past medical history as below including hypertension, diabetes, COPD, recent left knee replacement, here with  hypotension.  The patient states that over the last day, he has felt generally weak.  He normally does not have an issue with blood pressure but states that his blood pressure has been decreasing throughout the day.  His home health nurse checked it and it was in the 70s, so she called EMS.  On MS arrival, blood pressures been in the 70s consistently.  He has felt extremely fatigued.  He is felt tired.  He denies any specific complaints.  He feels like his left knee pain has been improving and he denies any redness or increased drainage from a wound VAC that he has in place after the procedure.  Does not recall any cough.  No urinary symptoms.  He is somewhat drowsy on arrival, limiting history.  Remainder of history limited due to his drowsiness and illness.    Past Medical History:  Diagnosis Date  . Arthritis   . Basal cell carcinoma 01/30/2019   Left ear, superior helix. Nodular and infiltrative patterns. Simple excision/EDC.  . Basal cell carcinoma 11/03/2019   left lat brow  . COPD (chronic obstructive pulmonary disease) (HCC)   . Diabetes mellitus without complication (HCC)   . Dyspnea   . Hypertension   . Melanoma (HCC) 2001   Right forehead. Metastatic melanoma 2016  . Sleep apnea     Patient Active Problem List   Diagnosis Date Noted  . Sepsis associated hypotension (HCC) 07/26/2020  . Essential hypertension 07/26/2020  . AKI (acute kidney injury) (HCC) 07/26/2020  . Hyperlipidemia 07/26/2020  . 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 EYE SURGERY     carterat surgery x 2  . FRACTURE SURGERY     both wrist ahs metal plates  . JOINT REPLACEMENT     left hip  . TOTAL KNEE ARTHROPLASTY Left 07/20/2020   Procedure: TOTAL KNEE ARTHROPLASTY - [place] Neighbor to Assist;  Surgeon: Kennedy Bucker, MD;  Location: [facility] ORS;  Service: Orthopedics;  Laterality: Left;  . trachostomy      Prior to Admission medications   Medication Sig Start Date End Date Taking? Authorizing Provider  alfuzosin (UROXATRAL) 10 MG 24 hr tablet Take 10 mg by mouth daily. 03/15/20   [provider]  Azelastine HCl 137 MCG/SPRAY SOLN Place 1 spray into the nose in the morning and at bedtime. 03/16/20   [provider]  chlorthalidone (HYGROTON) 25 MG tablet Take 25 mg by mouth daily. 04/26/20   [provider]  Cholecalciferol 50 MCG (2000 UT) CAPS Take 2,000 Units by mouth daily.    [provider]  Coenzyme Q10 100 MG capsule Take 100 mg by mouth daily.    [provider]  cycloSPORINE (RESTASIS) 0.05 % ophthalmic emulsion Place 1 drop into both eyes 2 (two) times daily.    [provider]  docusate sodium (COLACE) 100 MG capsule Take 1 capsule (100 mg total) by mouth 2 (two) times daily. 07/21/20   Amador Cunas  C, PA-C  dutasteride (AVODART) 0.5 MG capsule Take 0.5 mg by mouth daily. 03/11/20   [provider]  enoxaparin (LOVENOX) 40 MG/0.4ML injection Inject 0.4 mLs (40 mg total) into the skin daily for 14 days. 07/20/20 08/03/20  Duanne Guess, PA-C  fluticasone (FLONASE) 50 MCG/ACT nasal spray Place 2 sprays into both nostrils daily. 03/12/20   [provider]  fluticasone furoate-vilanterol (BREO ELLIPTA) 100-25 MCG/INH AEPB Inhale 1 puff into the lungs daily.    [provider]  ipratropium (ATROVENT) 0.06 % nasal spray Place 2 sprays into both nostrils 2 (two)  times daily. 04/21/20   [provider]  lidocaine-prilocaine (EMLA) cream Apply 1 application topically daily as needed (port access). 08/08/18   [provider]  LINZESS 145 MCG CAPS capsule Take 145 mcg by mouth daily. 03/22/20   [provider]  metFORMIN (GLUCOPHAGE) 1000 MG tablet Take 1,000 mg by mouth 2 (two) times daily. 04/15/20   [provider]  metoprolol tartrate (LOPRESSOR) 25 MG tablet Take 25 mg by mouth daily. 12/12/19   [provider]  montelukast (SINGULAIR) 10 MG tablet Take 10 mg by mouth at bedtime. 03/21/20   [provider]  Omega-3 Fatty Acids (FISH OIL) 1000 MG CAPS Take 1,000 mg by mouth daily.    [provider]  oxyCODONE (OXY IR/ROXICODONE) 5 MG immediate release tablet Take 1-2 tablets (5-10 mg total) by mouth every 4 (four) hours as needed for moderate pain (pain score 4-6). 07/20/20   Duanne Guess, PA-C  pregabalin (LYRICA) 50 MG capsule Take 50 mg by mouth See admin instructions. 50 mg with each meal, 100 mg at bedtime 03/22/20   [provider]  ramipril (ALTACE) 10 MG capsule Take 20 mg by mouth daily. 04/15/20   [provider]  rosuvastatin (CRESTOR) 40 MG tablet Take 40 mg by mouth daily. 03/15/20   [provider]  traMADol (ULTRAM) 50 MG tablet Take 1 tablet (50 mg total) by mouth every 6 (six) hours as needed. 07/20/20   Duanne Guess, PA-C  verapamil (VERELAN PM) 180 MG 24 hr capsule Take 360 mg by mouth at bedtime. 05/03/20   [provider]    Allergies Bee venom and Cefazolin  No family history on file.  Social History Social History   Tobacco Use  . Smoking status: Former Smoker    Types: Cigarettes    Quit date: 1978    Years since quitting: 44.3  . Smokeless tobacco: Former Network engineer Use Topics  . Alcohol use: Yes    Alcohol/week: 7.0 standard drinks    Types: 7 Glasses of wine per week    Comment: daily with dinner  . Drug use:  Not Currently    Review of Systems  Review of Systems  Unable to perform ROS: Mental status change  Constitutional: Positive for fatigue.  Neurological: Positive for weakness.     ____________________________________________  PHYSICAL EXAM:      VITAL SIGNS: ED Triage Vitals  Enc Vitals Group     BP      Pulse      Resp      Temp      Temp src      SpO2      Weight      Height      Head Circumference      Peak Flow      Pain Score      Pain Loc  Pain Edu?      Excl. in [place]?      Physical Exam Vitals and nursing note reviewed.  Constitutional:      General: He is not in acute distress.    Appearance: He is well-developed.  HENT:     Head: Normocephalic and atraumatic.     Mouth/Throat:     Mouth: Mucous membranes are dry.  Eyes:     Conjunctiva/sclera: Conjunctivae normal.  Cardiovascular:     Rate and Rhythm: Normal rate and regular rhythm.     Heart sounds: Normal heart sounds.  Pulmonary:     Effort: Pulmonary effort is normal. No respiratory distress.     Breath sounds: No wheezing.  Abdominal:     General: There is no distension.  Musculoskeletal:     Cervical back: Neck supple.     Comments: Left knee wound VAC in place, no surrounding erythema or drainage.  Wound VAC appears dry.  Minimal tenderness.  Skin:    General: Skin is warm.     Capillary Refill: Capillary refill takes less than 2 seconds.     Findings: No rash.  Neurological:     Mental Status: He is alert. He is disoriented.     Motor: No abnormal muscle tone.       ____________________________________________   LABS (all labs ordered are listed, but only abnormal results are displayed)  Labs Reviewed  LACTIC ACID, PLASMA - Abnormal; Notable for the following components:      Result Value   Lactic Acid, Venous 3.1 (*)    All other components within normal limits  LACTIC ACID, PLASMA - Abnormal; Notable for the following components:   Lactic Acid, Venous 2.4 (*)    All  other components within normal limits  COMPREHENSIVE METABOLIC PANEL - Abnormal; Notable for the following components:   Sodium 131 (*)    Potassium 3.4 (*)    Chloride 91 (*)    Glucose, Bld 197 (*)    BUN 37 (*)    Creatinine, Ser 2.64 (*)    Calcium 8.6 (*)    Albumin 2.8 (*)    GFR, Estimated 26 (*)    All other components within normal limits  CBC WITH DIFFERENTIAL/PLATELET - Abnormal; Notable for the following components:   WBC 21.9 (*)    RBC 3.61 (*)    Hemoglobin 9.8 (*)    HCT 29.5 (*)    RDW 17.5 (*)    Neutro Abs 20.2 (*)    Lymphs Abs 0.5 (*)    Abs Immature Granulocytes 0.36 (*)    All other components within normal limits  URINALYSIS, COMPLETE (UACMP) WITH MICROSCOPIC - Abnormal; Notable for the following components:   Color, Urine AMBER (*)    APPearance CLOUDY (*)    Hgb urine dipstick LARGE (*)    Protein, ur 100 (*)    Leukocytes,Ua LARGE (*)    Bacteria, UA MANY (*)    Non Squamous Epithelial PRESENT (*)    All other components within normal limits  GLUCOSE, CAPILLARY - Abnormal; Notable for the following components:   Glucose-Capillary 140 (*)    All other components within normal limits  CBG MONITORING, ED - Abnormal; Notable for the following components:   Glucose-Capillary 118 (*)    All other components within normal limits  RESP PANEL BY RT-PCR (FLU A&B, COVID) ARPGX2  CULTURE, BLOOD (ROUTINE X 2)  CULTURE, BLOOD (ROUTINE X 2)  URINE CULTURE  PROTIME-INR  APTT  PROTIME-INR  HIV ANTIBODY (ROUTINE TESTING W REFLEX)    ____________________________________________  EKG: Normal sinus rhythm, ventricular rate 96.  PR 177, QRS 99, QTc 469.  No acute ST elevations or depressions.  No EKG evidence of acute ischemia or infarct. ________________________________________  RADIOLOGY All imaging, including plain films, CT scans, and ultrasounds, independently reviewed by me, and interpretations confirmed via formal radiology reads.  ED MD  interpretation:   CXR: Clear XR Knee: unremarkable CT A/P: Obstructing L renal stone w/ hydro, pyelo  Official radiology report(s): CT ABDOMEN PELVIS WO CONTRAST  Result Date: 07/26/2020 CLINICAL DATA:  65 year old male with abdominal distension. EXAM: CT ABDOMEN AND PELVIS WITHOUT CONTRAST TECHNIQUE: Multidetector CT imaging of the abdomen and pelvis was performed following the standard protocol without IV contrast. COMPARISON:  None. FINDINGS: Evaluation of this exam is limited in the absence of intravenous contrast. Lower chest: Right lower lobe subpleural subsegmental atelectasis/scarring. The visualized lung bases are otherwise clear. There is coronary vascular calcification. Partially visualized central venous line with tip in the region of the cavoatrial junction. No intra-abdominal free air or free fluid. Hepatobiliary: There is diffuse fatty liver. No intrahepatic biliary dilatation. The gallbladder is unremarkable. Pancreas: Unremarkable. No pancreatic ductal dilatation or surrounding inflammatory changes. Spleen: Normal in size without focal abnormality. Adrenals/Urinary Tract: The adrenal glands unremarkable. There is an 11 mm stone at the left ureteropelvic junction with mild left hydronephrosis. Additional nonobstructing left renal calculi measure up to 1 cm in the upper pole of the left kidney. There is a punctate nonobstructing stone in the distal left ureter (88/2). Multiple nonobstructing right renal stones. Two adjacent stones in the right renal pelvis with combined length of 12 mm. There is no hydronephrosis on the right. There is a 3 cm right renal inferior pole cyst. The right ureter is unremarkable. The urinary bladder is partially distended. There is air within the bladder which may be related to recent instrumentation. Correlation with urinalysis recommended to exclude superimposed UTI. Stomach/Bowel: There is sigmoid diverticulosis without active inflammatory changes. There is no  bowel obstruction or active inflammation. The appendix is normal. Vascular/Lymphatic: Mild aortoiliac atherosclerotic disease. The IVC is unremarkable. The retroaortic left renal vein anatomy. No portal venous gas. There is no adenopathy. Reproductive: The prostate and seminal vesicles are grossly. No pelvic mass. Other: Small fat containing left inguinal hernia. Musculoskeletal: Osteopenia with degenerative changes of the spine. Total left hip arthroplasty. Subcutaneous stranding and edema adjacent to the left hip. No acute osseous pathology. IMPRESSION: 1. A 11 mm left UPJ stone with mild left hydronephrosis. A punctate nonobstructing stone is also noted the distal left ureter. Additional nonobstructing bilateral renal calculi. No hydronephrosis on the right. 2. Fatty liver. 3. Sigmoid diverticulosis. No bowel obstruction. Normal appendix. 4. Aortic Atherosclerosis (ICD10-I70.0). Electronically Signed   By: Anner Crete M.D.   On: 07/26/2020 20:24   DG Chest Port 1 View  Result Date: 07/26/2020 CLINICAL DATA:  65 year old male with concern for sepsis. EXAM: PORTABLE CHEST 1 VIEW COMPARISON:  None. FINDINGS: Evaluation is limited due to positioning of the patient and superimposition of the jaw over the left apex. Right-sided Port-A-Cath with tip in the region of the cavoatrial junction. There are bibasilar linear atelectasis. No focal consolidation, pleural effusion, pneumothorax. Mild cardiomegaly. No acute osseous pathology. IMPRESSION: 1. No acute cardiopulmonary process. 2. Mild cardiomegaly. Electronically Signed   By: Anner Crete M.D.   On: 07/26/2020 18:52   DG Knee Left Port  Result Date: 07/26/2020 CLINICAL DATA:  Knee  pain.  Sepsis EXAM: PORTABLE LEFT KNEE - 1-2 VIEW COMPARISON:  None. FINDINGS: Post total knee arthroplasty. Prosthetic components appear well seated. Expected soft tissue changes. 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 Electronically Signed   By: Suzy Bouchard M.D.   On: 07/26/2020 18:52   DG OR UROLOGY CYSTO IMAGE ([facility] ONLY)  Result Date: 07/26/2020 There is no interpretation for this exam.  This order is for images obtained during a surgical procedure.  Please See "Surgeries" Tab for more information regarding the procedure.    ____________________________________________  PROCEDURES   Procedure(s) performed (including Critical Care):  Procedures  ____________________________________________  INITIAL IMPRESSION / MDM / [place] / ED COURSE  As part of my medical decision making, I reviewed the following data within the [place] notes reviewed and incorporated, Old chart reviewed, Notes from prior ED visits, and [place] Controlled Substance Database       *Austin Sullivan was evaluated in Emergency Department on 07/27/2020 for the symptoms described in the history of present illness. He was evaluated in the context of the global COVID-19 pandemic, which necessitated consideration that the patient might be at risk for infection with the SARS-CoV-2 virus that causes COVID-19. Institutional protocols and algorithms that pertain to the evaluation of patients at risk for COVID-19 are in a state of rapid change based on information released by regulatory bodies including the CDC and federal and state organizations. These policies and algorithms were followed during the patient's care in the ED.  Some ED evaluations and interventions may be delayed as a result of limited staffing during the pandemic.*     Medical Decision Making:  65 yo M here with severe hypotension, altered mental status. Pt hypotensive, encephalopathic but protecting airway on arrival. Broad-spectrum abx and fluids started for sepsis protocol. Labs, imaging reviewed. Suspect severe sepsis in setting of infected kidney stone with pyelo. Labs show significant leukocytosis, LA 3.1, and AKI. UA c/w UTI. Discussed case with Dr. Diamantina Providence, will go to  OR. Will admit to medicine. Vanc/Zosyn initially given for broad spectrum ABX,   ____________________________________________  FINAL CLINICAL IMPRESSION(S) / ED DIAGNOSES  Final diagnoses:  Severe sepsis ([place])  Kidney stone  Pyelonephritis     MEDICATIONS GIVEN DURING THIS VISIT:  Medications  traMADol (ULTRAM) tablet 50 mg ( Oral MAR Unhold 07/27/20 0018)  rosuvastatin (CRESTOR) tablet 40 mg (40 mg Oral Given 07/27/20 0059)  enoxaparin (LOVENOX) injection 57.5 mg ( Subcutaneous MAR Unhold 07/27/20 0018)  fluticasone (FLONASE) 50 MCG/ACT nasal spray 2 spray ( Each Nare MAR [place] 07/27/20 0018)  azelastine (ASTELIN) 0.1 % nasal spray 1 spray (1 spray Each Nare Given 07/27/20 0059)  fluticasone furoate-vilanterol (BREO ELLIPTA) 100-25 MCG/INH 1 puff ( Inhalation MAR Unhold 07/27/20 0018)  ipratropium (ATROVENT) 0.06 % nasal spray 2 spray (2 sprays Each Nare Given 07/27/20 0059)  montelukast (SINGULAIR) tablet 10 mg (10 mg Oral Given 07/27/20 0059)  acetaminophen (TYLENOL) tablet 650 mg ( Oral MAR Unhold 07/27/20 0018)    Or  acetaminophen (TYLENOL) suppository 650 mg ( Rectal MAR Unhold 07/27/20 0018)  ondansetron (ZOFRAN) tablet 4 mg ( Oral MAR Unhold 07/27/20 0018)    Or  ondansetron (ZOFRAN) injection 4 mg ( Intravenous MAR Unhold 07/27/20 0018)  cefTRIAXone (ROCEPHIN) 2 g in sodium chloride 0.9 % 100 mL IVPB (2 g Intravenous New Bag/Given 07/27/20 0058)  diphenhydrAMINE (BENADRYL) injection 25 mg ( Intravenous MAR [place] 07/27/20 0018)  0.9 %  sodium chloride infusion ( Intravenous  New Bag/Given 07/27/20 0057)  methylPREDNISolone sodium succinate (SOLU-MEDROL) 40 mg/mL injection 40 mg ( Intravenous MAR [place] 07/27/20 0018)  insulin aspart (novoLOG) injection 0-20 Units ( Subcutaneous MAR [place] 07/27/20 0018)  insulin aspart (novoLOG) injection 0-5 Units ( Subcutaneous MAR Unhold 07/27/20 0018)  hydrALAZINE (APRESOLINE) tablet 25 mg (has no administration in time range)  Chlorhexidine  Gluconate Cloth 2 % PADS 6 each (has no administration in time range)  lactated ringers bolus 2,000 mL (0 mLs Intravenous Stopped 07/26/20 2004)  piperacillin-tazobactam (ZOSYN) IVPB 3.375 g (0 g Intravenous Stopped 07/26/20 1858)  vancomycin (VANCOCIN) IVPB 1000 mg/200 mL premix (0 mg Intravenous Stopped 07/26/20 2004)  sodium chloride 0.9 % bolus 1,000 mL (0 mLs Intravenous Stopped 07/26/20 2150)  sodium chloride 0.9 % bolus 393 mL (0 mLs Intravenous Stopped 07/26/20 2030)     ED Discharge Orders    None       Note:  This document was prepared using Dragon voice recognition software and may include unintentional dictation errors.   Duffy Bruce, MD 07/27/20 [phone]

## 2020-07-26 NOTE — Progress Notes (Signed)
Notified provider of need to order fluid bolus, pt needs 3396 cc fluid, bedside RN aware.

## 2020-07-26 NOTE — Op Note (Signed)
Date of procedure: 07/26/20  Preoperative diagnosis:  1. Left proximal ureteral stone 2. Sepsis from urinary source  Postoperative diagnosis:  1. Left proximal ureteral stone 2. Sepsis from urinary source 3. Right renal stones  Procedure: 1. Cystoscopy, left ureteral stent placement 2. Right retrograde pyelogram with intraoperative interpretation, right ureteral stent placement  Surgeon: Nickolas Madrid, MD  Anesthesia: General  Complications: None  Intraoperative findings:  1.  Cloudy urine in bladder consistent with infection, mild erythema throughout, no tumors or lesions, multiple small diverticula 2.  Purulent urine after left ureteral stent placement 3.  Purulent urine from right ureteral orifice, and right stent placed as well with renal stones near the right UPJ on CT  EBL: None  Specimens: None  Drains: Left 6 French by 28 cm ureteral stent, right 6 [place] by 26 cm ureteral stent, 18 French two-way Foley  Indication: Austin Sullivan is a 65 y.o. patient with recent knee replacement who presented with malaise and hypotension for 24 hours and was found to have a 1 cm left proximal ureteral stone with hydronephrosis and sepsis from urinary source.  After reviewing the management options for treatment, they elected to proceed with the above surgical procedure(s). We have discussed the potential benefits and risks of the procedure, side effects of the proposed treatment, the likelihood of the patient achieving the goals of the procedure, and any potential problems that might occur during the procedure or recuperation. Informed consent has been obtained.  Description of procedure:  The patient was taken to the operating room and general anesthesia was induced. SCDs were placed for DVT prophylaxis. The patient was placed in the dorsal lithotomy position, prepped and draped in the usual sterile fashion, and preoperative antibiotics(Vanco and Zosyn in ED) were administered. A  preoperative time-out was performed.   A 21 French rigid cystoscope was used to intubate the urethra and a normal appearing urethra was followed proximally into the bladder.  The prostate was small.  Thorough cystoscopy showed very cloudy urine consistent with infection and this was irrigated free.  There was mild erythema throughout the bladder consistent with infection.  The ureteral orifice ease were orthotopic bilaterally.  There were multiple small diverticula, but notes tumors or lesions.  I turned my attention to the left ureteral orifice and a sensor wire was used to intubate the orifice and advanced easily up to the kidney.  The stone could clearly be seen on fluoroscopy, and the wire passed easily alongside into the upper pole.  A 6 French by 28 cm ureteral stent advanced easily over the wire into the upper pole of the kidney, and there was an excellent curl on fluoroscopy in the upper pole, as well as under direct vision the bladder.  There was frank purulence draining through the side ports of the stent.  At this point I observed purulent drainage from the right ureteral orifice as well.  On the CT there were 2 stones in the renal pelvis close to the UPJ with significant perinephric stranding on the right side as well, and I opted to place a right ureteral stent.  A 5 French ureteral access catheter was used to intubate the right ureteral orifice and retrograde showed no ureteral filling defects and possible mild hydronephrosis of the right renal pelvis.  A 6 French by 26 cm ureteral stent advanced easily over the wire with a curl in the upper pole, as well as under direct vision the bladder.  There was brisk drainage of cloudy urine  through the side ports of the right-sided stent.  An 71 French two-way Foley was placed to maximize drainage in the immediate postoperative period, and 10 cc were placed in the balloon.  Disposition: Stable to PACU  Plan: Continue broad-spectrum antibiotics and  resuscitation Maintain Foley until clinically improved Will contact Dr. Rudene Christians and orthopedic team in the morning to notify of this admission We will plan for bilateral ureteroscopy, laser lithotripsy, stent change in 2 to 3 weeks when clinically improved  Nickolas Madrid, MD

## 2020-07-26 NOTE — ED Notes (Signed)
Report to OR RN, patient placed in hospital gown, awaiting transport

## 2020-07-26 NOTE — ED Triage Notes (Signed)
Patient presents to ER via EMS. Patient coming from home. EMS called by home health RN for hypotension. Patient reports he had knee replacement 07/20/20. Patient reports decreased appetite. Patient A&Ox3.

## 2020-07-26 NOTE — Anesthesia Preprocedure Evaluation (Addendum)
Anesthesia Evaluation  Patient identified by MRN, date of birth, ID band Patient awake    Reviewed: Allergy & Precautions, H&P , NPO status , Patient's Chart, lab work & pertinent test results  History of Anesthesia Complications Negative for: history of anesthetic complications  Airway Mallampati: III  TM Distance: <3 FB Neck ROM: full    Dental  (+) Chipped, Poor Dentition   Pulmonary shortness of breath and with exertion, sleep apnea and Continuous Positive Airway Pressure Ventilation , COPD, former smoker,     + decreased breath sounds      Cardiovascular Exercise Tolerance: Good hypertension, Normal cardiovascular exam     Neuro/Psych negative neurological ROS  negative psych ROS   GI/Hepatic negative GI ROS, Neg liver ROS,   Endo/Other  diabetes, Type 2  Renal/GU ARFRenal disease     Musculoskeletal  (+) Arthritis ,   Abdominal   Peds  Hematology negative hematology ROS (+)   Anesthesia Other Findings Septic  Past Medical History: No date: Arthritis 01/30/2019: Basal cell carcinoma     Comment:  Left ear, superior helix. Nodular and infiltrative               patterns. Simple excision/EDC. 11/03/2019: Basal cell carcinoma     Comment:  left lat brow No date: COPD (chronic obstructive pulmonary disease) (HCC) No date: Diabetes mellitus without complication ([place]) No date: Dyspnea No date: Hypertension 2001: Melanoma ([place])     Comment:  Right forehead. Metastatic melanoma 2016 No date: Sleep apnea  Past Surgical History: No date: EYE SURGERY     Comment:  carterat surgery x 2 No date: FRACTURE SURGERY     Comment:  both wrist ahs metal plates No date: JOINT REPLACEMENT     Comment:  left hip 07/20/2020: TOTAL KNEE ARTHROPLASTY; Left     Comment:  Procedure: TOTAL KNEE ARTHROPLASTY - [name] to               Assist;  Surgeon: Hessie Knows, MD;  Location: [facility] ORS;              Service:  Orthopedics;  Laterality: Left; No date: trachostomy     Reproductive/Obstetrics negative OB ROS                            Anesthesia Physical Anesthesia Plan  ASA: IV and emergent  Anesthesia Plan: General ETT   Post-op Pain Management:    Induction: Intravenous  PONV Risk Score and Plan: Ondansetron, Dexamethasone, Midazolam and Treatment may vary due to age or medical condition  Airway Management Planned: Oral ETT  Additional Equipment:   Intra-op Plan:   Post-operative Plan: Extubation in OR and Possible Post-op intubation/ventilation  Informed Consent: I have reviewed the patients History and Physical, chart, labs and discussed the procedure including the risks, benefits and alternatives for the proposed anesthesia with the patient or authorized representative who has indicated his/her understanding and acceptance.     Dental Advisory Given  Plan Discussed with: Anesthesiologist, CRNA and Surgeon  Anesthesia Plan Comments: (Patient and wife consented for risks of anesthesia including but not limited to:  - adverse reactions to medications - damage to eyes, teeth, lips or other oral mucosa - nerve damage due to positioning  - sore throat or hoarseness - Damage to heart, brain, nerves, lungs, other parts of body or loss of life  They voiced understanding.)       Anesthesia Quick Evaluation

## 2020-07-26 NOTE — Anesthesia Procedure Notes (Signed)
Procedure Name: Intubation Date/Time: 07/26/2020 10:45 PM Performed by: Jerrye Noble, CRNA Pre-anesthesia Checklist: Patient identified, Emergency Drugs available, Suction available and Patient being monitored Patient Re-evaluated:Patient Re-evaluated prior to induction Oxygen Delivery Method: Circle system utilized Preoxygenation: Pre-oxygenation with 100% oxygen Induction Type: IV induction Ventilation: Mask ventilation without difficulty Laryngoscope Size: McGraph and 4 Grade View: Grade I Tube type: Oral Tube size: 7.5 mm Airway Equipment and Method: Stylet and Video-laryngoscopy Placement Confirmation: ETT inserted through vocal cords under direct vision,  positive ETCO2 and breath sounds checked- equal and bilateral Secured at: 25 cm Tube secured with: Tape Dental Injury: Teeth and Oropharynx as per pre-operative assessment

## 2020-07-26 NOTE — Transfer of Care (Signed)
Immediate Anesthesia Transfer of Care Note  Patient: Austin Sullivan  Procedure(s) Performed: CYSTOSCOPY WITH STENT PLACEMENT (Bilateral )  Patient Location: PACU  Anesthesia Type:General  Level of Consciousness: awake, drowsy and patient cooperative  Airway & Oxygen Therapy: Patient Spontanous Breathing and Patient connected to face mask oxygen  Post-op Assessment: Report given to RN and Post -op Vital signs reviewed and stable  Post vital signs: Reviewed and stable  Last Vitals:  Vitals Value Taken Time  BP 112/55 07/26/20 2323  Temp    Pulse 98 07/26/20 2327  Resp 18 07/26/20 2327  SpO2 100 % 07/26/20 2327  Vitals shown include unvalidated device data.  Last Pain:  Vitals:   07/26/20 1806  TempSrc: Oral         Complications: No complications documented.

## 2020-07-26 NOTE — ED Notes (Signed)
Bladder scan 70 ml

## 2020-07-26 NOTE — Consult Note (Signed)
CODE SEPSIS - PHARMACY COMMUNICATION  **Broad Spectrum Antibiotics should be administered within 1 hour of Sepsis diagnosis**  Time Code Sepsis Called/Page Received: 1811  Antibiotics Ordered: vancomycin, Zosyn  Time of 1st antibiotic administration: 9528  Additional action taken by pharmacy: n/a  If necessary, Name of Provider/Nurse Contacted: Staples ,PharmD, BCPS Clinical Pharmacist  07/26/2020  6:16 PM

## 2020-07-26 NOTE — ED Notes (Signed)
Patient returned from CT

## 2020-07-26 NOTE — H&P (Addendum)
History and Physical   Austin Sullivan:[id number] DOB: [birth year] DOA: 07/26/2020  PCP: Derinda Late, MD  Outpatient Specialists: Dr. Rudene Christians, orthopedic surgeon Patient coming from: home  I have personally briefly reviewed patient's old medical records in [place].  Chief Concern: Abnormal vitals  HPI: Austin Sullivan is a 65 y.o. male with medical history significant for chronic pain, hyperlipidemia, hypertension, COPD, history of Hodgkin's lymphoma, stage IV melanoma, previously on nivolumab with past chemotherapy for 5 months after 41 of 55 cycles, vocal cord paralysis, status post stent placed for approximation of cords, OSA, on his fifth device most recent is Zemple device replaced with dream tomorrow, baseline exertional dyspnea, obesity, osteoarthritis status post left knee arthroplasty in 07/20/2020, non-insulin-dependent diabetes mellitus, presents to the emergency department for chief concerns of low blood pressure.  Patient states that during occupational therapy, the therapist reported that he had low blood pressure, high heart rate and low O2, thus prompting him to present to the emergency department for further evaluation.  At home he denies fever, vomiting, chest pain, dysuria, hematuria, headache, syncope, loss of consciousness. His wife at bedside endorses that he has nausea since arthroplasty.  Social history: lives with spouse. He drinks daily wine with dinner. He denies recreational drug use. He is a former tobacco user and quit over 40 years ago.  Vaccination: vaccinated for covid 19, three doses  ROS: Constitutional: no weight change, no fever ENT/Mouth: no sore throat, no rhinorrhea Eyes: no eye pain, no vision changes Cardiovascular: no chest pain, no dyspnea,  no edema, no palpitations Respiratory: no cough, no sputum, no wheezing Gastrointestinal: no nausea, no vomiting, no diarrhea, no constipation Genitourinary: no urinary incontinence, no dysuria, no  hematuria Musculoskeletal: no arthralgias, + myalgias Skin: no skin lesions, no pruritus, Neuro: + weakness, no loss of consciousness, no syncope Psych: no anxiety, no depression, + decrease appetite Heme/Lymph: no bruising, no bleeding  ED Course: Discussed with the emergency medicine provider, patient requiring hospitalization for sepsis with hypotension.  Vitals in the emergency department show temperature of 99.1, respiration rate 14, heart rate 93, blood pressure initially was 92/43, improved to 99/54, SPO2 of 97% on room air.  Labs in the emergency department was remarkable for lactic of 3.1.  EDP gave lactated Ringer 2 L bolus, 1.393 normal saline bolus, vancomycin, Zosyn.  Assessment/Plan  Principal Problem:   Sepsis associated hypotension (HCC) Active Problems:   S/P TKR (total knee replacement) using cement, left   Essential hypertension   AKI (acute kidney injury) ([place])   Hyperlipidemia   # Meets severe sepsis criteria # Leukocytosis - Increase heart rate, low blood pressure, WBC elevated, lactic acid, organ involvement is renal - Status post Vanco and Zosyn per EDP - Patient has a rash/hives allergy with cefazolin, discussed with pharmacy, we will pretreat with Benadryl and Solu-Medrol prior to hanging ceftriaxone - Ceftriaxone IV 2 g daily, Benadryl milligram IV as needed, Solu-Medrol 40 mg IV ordered - Dr. Diamantina Providence, neurologist has been consulted by EDP and will take patient to the OR - Admit to stepdown, inpatient, with telemetry  # COPD- resumed home inhalers -Solu-Medrol 40 mg IV twice daily  # Primary osteoarthritis of the left knee status post total knee arthroplasty - Knee arthroplasty was on 07/20/2020 - Wound VAC in place per orthopedic surgeon with no purulence in the wound VAC at this time - therefore I do not suspect this is a source of infection  # Hyperlipidemia- rosuvastatin 40 mg daily  # Non-insulin-dependent  diabetes mellitus-holding metformin  due to acute kidney injury at this time, insulin sliding scale, insulin at bedtime coverage  # Neuropathy- pregabalin 50 mg daily has not been ordered  # Hypertension-holding home ramipril 10 mg daily, verapamil 180 mg nightly, metoprolol tartrate 25 mg daily, chlorthalidone 25 mg daily due to hypotension  # History of melanoma # OSA-CPAP nightly ordered # History of Hodgkin's lymphoma  Chart reviewed.   DVT prophylaxis: ted hose, patient spouse stated that he last injected enoxaparin on day of presentation at 11 AM Code Status: full code Diet: N.p.o. Family Communication: Updated spouse at bedside Disposition Plan: Pending clinical course Consults called: Urology Admission status: Inpatient, stepdown, telemetry  Past Medical History:  Diagnosis Date  . Arthritis   . Basal cell carcinoma 01/30/2019   Left ear, superior helix. Nodular and infiltrative patterns. Simple excision/EDC.  . Basal cell carcinoma 11/03/2019   left lat brow  . COPD (chronic obstructive pulmonary disease) ([place])   . Diabetes mellitus without complication ([place])   . Dyspnea   . Hypertension   . Melanoma ([place]) 2001   Right forehead. Metastatic melanoma 2016  . Sleep apnea    Past Surgical History:  Procedure Laterality Date  . EYE SURGERY     carterat surgery x 2  . FRACTURE SURGERY     both wrist ahs metal plates  . JOINT REPLACEMENT     left hip  . TOTAL KNEE ARTHROPLASTY Left 07/20/2020   Procedure: TOTAL KNEE ARTHROPLASTY - [name] to Assist;  Surgeon: Hessie Knows, MD;  Location: [facility] ORS;  Service: Orthopedics;  Laterality: Left;  . trachostomy     Social History:  reports that he quit smoking about 44 years ago. His smoking use included cigarettes. He has quit using smokeless tobacco. He reports current alcohol use of about 7.0 standard drinks of alcohol per week. He reports previous drug use.  Allergies  Allergen Reactions  . Bee Venom Swelling  . Cefazolin Hives and Rash   No  family history on file. Family history: Family history reviewed and not pertinent  Prior to Admission medications   Medication Sig Start Date End Date Taking? Authorizing Provider  alfuzosin (UROXATRAL) 10 MG 24 hr tablet Take 10 mg by mouth daily. 03/15/20   [provider]  Azelastine HCl 137 MCG/SPRAY SOLN Place 1 spray into the nose in the morning and at bedtime. 03/16/20   [provider]  chlorthalidone (HYGROTON) 25 MG tablet Take 25 mg by mouth daily. 04/26/20   [provider]  Cholecalciferol 50 MCG (2000 UT) CAPS Take 2,000 Units by mouth daily.    [provider]  Coenzyme Q10 100 MG capsule Take 100 mg by mouth daily.    [provider]  cycloSPORINE (RESTASIS) 0.05 % ophthalmic emulsion Place 1 drop into both eyes 2 (two) times daily.    [provider]  docusate sodium (COLACE) 100 MG capsule Take 1 capsule (100 mg total) by mouth 2 (two) times daily. 07/21/20   Duanne Guess, PA-C  dutasteride (AVODART) 0.5 MG capsule Take 0.5 mg by mouth daily. 03/11/20   [provider]  enoxaparin (LOVENOX) 40 MG/0.4ML injection Inject 0.4 mLs (40 mg total) into the skin daily for 14 days. 07/20/20 08/03/20  Duanne Guess, PA-C  fluticasone (FLONASE) 50 MCG/ACT nasal spray Place 2 sprays into both nostrils daily. 03/12/20   [provider]  fluticasone furoate-vilanterol (BREO ELLIPTA) 100-25 MCG/INH AEPB Inhale 1 puff into the lungs daily.  [provider]  ipratropium (ATROVENT) 0.06 % nasal spray Place 2 sprays into both nostrils 2 (two) times daily. 04/21/20   [provider]  lidocaine-prilocaine (EMLA) cream Apply 1 application topically daily as needed (port access). 08/08/18   [provider]  LINZESS 145 MCG CAPS capsule Take 145 mcg by mouth daily. 03/22/20   [provider]  metFORMIN (GLUCOPHAGE) 1000 MG tablet Take 1,000 mg by mouth 2 (two) times daily. 04/15/20   [provider]  metoprolol tartrate (LOPRESSOR) 25 MG tablet Take 25 mg by mouth daily. 12/12/19   [provider]  montelukast (SINGULAIR) 10 MG tablet Take 10 mg by mouth at bedtime. 03/21/20   [provider]  Omega-3 Fatty Acids (FISH OIL) 1000 MG CAPS Take 1,000 mg by mouth daily.    [provider]  oxyCODONE (OXY IR/ROXICODONE) 5 MG immediate release tablet Take 1-2 tablets (5-10 mg total) by mouth every 4 (four) hours as needed for moderate pain (pain score 4-6). 07/20/20   Duanne Guess, PA-C  pregabalin (LYRICA) 50 MG capsule Take 50 mg by mouth See admin instructions. 50 mg with each meal, 100 mg at bedtime 03/22/20   [provider]  ramipril (ALTACE) 10 MG capsule Take 20 mg by mouth daily. 04/15/20   [provider]  rosuvastatin (CRESTOR) 40 MG tablet Take 40 mg by mouth daily. 03/15/20   [provider]  traMADol (ULTRAM) 50 MG tablet Take 1 tablet (50 mg total) by mouth every 6 (six) hours as needed. 07/20/20   Duanne Guess, PA-C  verapamil (VERELAN PM) 180 MG 24 hr capsule Take 360 mg by mouth at bedtime. 05/03/20   [provider]    Physical Exam: Vitals:   07/26/20 1930 07/26/20 2030 07/26/20 2100 07/26/20 2130  BP: (!) 102/53 (!) 110/58 (!) 99/54 (!) 110/56  Pulse: 88 89 90 93  Resp: 14 16 16 14   Temp:      TempSrc:      SpO2: 96% 99% 92% 97%   Constitutional: appears age-appropriate, NAD, calm, comfortable Eyes: PERRL, lids and conjunctivae normal ENMT: Mucous membranes are moist. Posterior pharynx clear of any exudate or lesions. Age-appropriate dentition. Hearing appropriate Neck: normal, supple, no masses, no thyromegaly Respiratory: clear to auscultation bilaterally, no wheezing, no crackles. Normal respiratory effort. No accessory muscle use.  Cardiovascular: Regular rate and rhythm, no murmurs / rubs / gallops. No extremity edema. 2+ pedal pulses. No carotid bruits.  Abdomen: Obese abdomen,  distended, mild diffuse tenderness, no masses palpated, no hepatosplenomegaly. Bowel sounds positive.  Musculoskeletal: no clubbing / cyanosis. No joint deformity upper and lower extremities. Good ROM, no contractures, no atrophy. Normal muscle tone.  Skin: no rashes, lesions, ulcers. No induration Neurologic: Sensation intact. Strength 5/5 in all 4.  Psychiatric: Normal judgment and insight. Alert and oriented x 3. Normal mood.   EKG: independently reviewed, showing sinus rhythm with rate of 96, QTc 469  x-ray on Admission: I personally reviewed and I agree with radiologist reading as below.  CT ABDOMEN PELVIS WO CONTRAST  Result Date: 07/26/2020 CLINICAL DATA:  65 year old male with abdominal distension. EXAM: CT ABDOMEN AND PELVIS WITHOUT CONTRAST TECHNIQUE: Multidetector CT imaging of the abdomen and pelvis was performed following the standard protocol without IV contrast. COMPARISON:  None. FINDINGS: Evaluation of this exam is limited in the absence of intravenous contrast. Lower chest: Right lower lobe subpleural subsegmental atelectasis/scarring. The visualized lung bases are otherwise clear. There is coronary vascular  calcification. Partially visualized central venous line with tip in the region of the cavoatrial junction. No intra-abdominal free air or free fluid. Hepatobiliary: There is diffuse fatty liver. No intrahepatic biliary dilatation. The gallbladder is unremarkable. Pancreas: Unremarkable. No pancreatic ductal dilatation or surrounding inflammatory changes. Spleen: Normal in size without focal abnormality. Adrenals/Urinary Tract: The adrenal glands unremarkable. There is an 11 mm stone at the left ureteropelvic junction with mild left hydronephrosis. Additional nonobstructing left renal calculi measure up to 1 cm in the upper pole of the left kidney. There is a punctate nonobstructing stone in the distal left ureter (88/2). Multiple nonobstructing right renal stones. Two adjacent  stones in the right renal pelvis with combined length of 12 mm. There is no hydronephrosis on the right. There is a 3 cm right renal inferior pole cyst. The right ureter is unremarkable. The urinary bladder is partially distended. There is air within the bladder which may be related to recent instrumentation. Correlation with urinalysis recommended to exclude superimposed UTI. Stomach/Bowel: There is sigmoid diverticulosis without active inflammatory changes. There is no bowel obstruction or active inflammation. The appendix is normal. Vascular/Lymphatic: Mild aortoiliac atherosclerotic disease. The IVC is unremarkable. The retroaortic left renal vein anatomy. No portal venous gas. There is no adenopathy. Reproductive: The prostate and seminal vesicles are grossly. No pelvic mass. Other: Small fat containing left inguinal hernia. Musculoskeletal: Osteopenia with degenerative changes of the spine. Total left hip arthroplasty. Subcutaneous stranding and edema adjacent to the left hip. No acute osseous pathology. IMPRESSION: 1. A 11 mm left UPJ stone with mild left hydronephrosis. A punctate nonobstructing stone is also noted the distal left ureter. Additional nonobstructing bilateral renal calculi. No hydronephrosis on the right. 2. Fatty liver. 3. Sigmoid diverticulosis. No bowel obstruction. Normal appendix. 4. Aortic Atherosclerosis (ICD10-I70.0). Electronically Signed   By: Anner Crete M.D.   On: 07/26/2020 20:24   DG Chest Port 1 View  Result Date: 07/26/2020 CLINICAL DATA:  65 year old male with concern for sepsis. EXAM: PORTABLE CHEST 1 VIEW COMPARISON:  None. FINDINGS: Evaluation is limited due to positioning of the patient and superimposition of the jaw over the left apex. Right-sided Port-A-Cath with tip in the region of the cavoatrial junction. There are bibasilar linear atelectasis. No focal consolidation, pleural effusion, pneumothorax. Mild cardiomegaly. No acute osseous pathology. IMPRESSION:  1. No acute cardiopulmonary process. 2. Mild cardiomegaly. Electronically Signed   By: Anner Crete M.D.   On: 07/26/2020 18:52   DG Knee Left Port  Result Date: 07/26/2020 CLINICAL DATA:  Knee pain.  Sepsis EXAM: PORTABLE LEFT KNEE - 1-2 VIEW COMPARISON:  None. FINDINGS: Post total knee arthroplasty. Prosthetic components appear well seated. Expected soft tissue changes. 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 Electronically Signed   By: Suzy Bouchard M.D.   On: 07/26/2020 18:52   DG OR UROLOGY CYSTO IMAGE ([facility] ONLY)  Result Date: 07/26/2020 There is no interpretation for this exam.  This order is for images obtained during a surgical procedure.  Please See "Surgeries" Tab for more information regarding the procedure.   Labs on Admission: I have personally reviewed following labs  CBC: Recent Labs  Lab 07/20/20 0630 07/20/20 1356 07/21/20 0601 07/22/20 0513 07/26/20 1815  WBC  --  10.1 9.8 11.0* 21.9*  NEUTROABS  --   --   --   --  20.2*  HGB 13.3 11.9* 11.5* 10.1* 9.8*  HCT 39.0 36.7* 34.3* 30.5* 29.5*  MCV  --  82.7 80.7 81.1 81.7  PLT  --  232 228 217 [id number]   Basic Metabolic Panel: Recent Labs  Lab 07/20/20 0630 07/20/20 1356 07/21/20 0601 07/22/20 0513 07/26/20 1815  NA 139  --  137 133* 131*  K 3.3*  --  3.0* 3.2* 3.4*  CL 101  --  98 97* 91*  CO2  --   --  26 27 25   GLUCOSE 190*  --  211* 136* 197*  BUN 27*  --  20 14 37*  CREATININE 1.30* 1.28* 1.05 0.85 2.64*  CALCIUM  --   --  8.6* 8.3* 8.6*   GFR: Estimated Creatinine Clearance: 36.3 mL/min (A) (by C-G formula based on SCr of 2.64 mg/dL (H)).  Liver Function Tests: Recent Labs  Lab 07/26/20 1815  AST 26  ALT 25  ALKPHOS 63  BILITOT 0.9  PROT 6.5  ALBUMIN 2.8*   Coagulation Profile: Recent Labs  Lab 07/26/20 1815  INR 1.2   CBG: Recent Labs  Lab 07/22/20 1144 07/22/20 1652 07/22/20 2006 07/23/20 0741 07/23/20 1212  GLUCAP 212* 236* 209* 156* 158*   Urine  analysis:    Component Value Date/Time   COLORURINE AMBER (A) 07/26/2020 1815   APPEARANCEUR CLOUDY (A) 07/26/2020 1815   LABSPEC 1.014 07/26/2020 1815   PHURINE 5.0 07/26/2020 1815   GLUCOSEU NEGATIVE 07/26/2020 1815   HGBUR LARGE (A) 07/26/2020 1815   BILIRUBINUR NEGATIVE 07/26/2020 1815   KETONESUR NEGATIVE 07/26/2020 1815   PROTEINUR 100 (A) 07/26/2020 1815   NITRITE NEGATIVE 07/26/2020 1815   LEUKOCYTESUR LARGE (A) 07/26/2020 1815   Austin Sullivan N Mylez Venable D.O. [facility]  If 7PM-7AM, please contact overnight-coverage provider If 7AM-7PM, please contact day coverage provider [email or web address]  07/26/2020, 10:53 PM

## 2020-07-26 NOTE — Consult Note (Signed)
PHARMACY -  BRIEF ANTIBIOTIC NOTE   Pharmacy has received consult(s) for Zosyn and Vancomycin from an ED provider.  The patient's profile has been reviewed for ht/wt/allergies/indication/available labs.    One time order(s) placed for   Vancomycin 1 gram  Zosyn 3.375 gram  Further antibiotics/pharmacy consults should be ordered by admitting physician if indicated.                       Thank you, Dorothe Pea, PharmD, BCPS Clinical Pharmacist  07/26/2020  6:17 PM

## 2020-07-26 NOTE — Consult Note (Signed)
Urology Consult   I have been asked to see the patient by Dr. Myrene Buddy, for evaluation and management of left ureteral stone, UTI, and sepsis from urinary source.  Chief Complaint: Malaise  HPI:  Austin Sullivan is a 65 y.o. year old male with recent left knee replacement 1 week ago who presents with 24 hours of malaise and weakness.  He was found to be tachycardic and hypotensive at home and was sent to the ER.  In the ED, work-up was suspicious for sepsis from urinary source with grossly infected urinalysis, and CT showed a 1 cm left proximal ureteral stone with upstream hydronephrosis.  There was no clinical evidence of left knee infection.  He denies any prior history of stones.  He denies any flank pain.  PMH: Past Medical History:  Diagnosis Date  . Arthritis   . Basal cell carcinoma 01/30/2019   Left ear, superior helix. Nodular and infiltrative patterns. Simple excision/EDC.  . Basal cell carcinoma 11/03/2019   left lat brow  . COPD (chronic obstructive pulmonary disease) ([place])   . Diabetes mellitus without complication ([place])   . Dyspnea   . Hypertension   . Melanoma ([place]) 2001   Right forehead. Metastatic melanoma 2016  . Sleep apnea     Surgical History: Past Surgical History:  Procedure Laterality Date  . EYE SURGERY     carterat surgery x 2  . FRACTURE SURGERY     both wrist ahs metal plates  . JOINT REPLACEMENT     left hip  . TOTAL KNEE ARTHROPLASTY Left 07/20/2020   Procedure: TOTAL KNEE ARTHROPLASTY - [name] to Assist;  Surgeon: Hessie Knows, MD;  Location: [facility] ORS;  Service: Orthopedics;  Laterality: Left;  . trachostomy     Allergies:  Allergies  Allergen Reactions  . Bee Venom Swelling  . Cefazolin Hives and Rash   Social History:  reports that he quit smoking about 44 years ago. His smoking use included cigarettes. He has quit using smokeless tobacco. He reports current alcohol use of about 7.0 standard drinks of alcohol per week. He reports  previous drug use.  ROS: Negative aside from those stated in the HPI.  Physical Exam: BP (!) 99/54   Pulse 90   Temp 99.1 F (37.3 C) (Oral)   Resp 16   SpO2 92%    Constitutional: Ill-appearing Cardiovascular: Tachycardic, regular rhythm Respiratory: Clear to auscultation bilaterally GI: Abdomen is soft, nontender, nondistended, no abdominal masses GU: Left CVA tenderness Lymph: No cervical or inguinal lymphadenopathy. Skin: No rashes, bruises or suspicious lesions. Neurologic: Grossly intact, no focal deficits, moving all 4 extremities. Psychiatric: Normal mood and affect. Left knee with wound VAC in place, no obvious erythema surrounding wound VAC   Laboratory Data: Reviewed, see HPI Lactate 3.1 WBC 22k Creatinine 2.64 from baseline 0.5 Urinalysis with many bacteria, 11-20 RBCs, 20-50 WBCs, WBC clumps, large leukocytes, nitrite negative  Pertinent Imaging: I have personally reviewed the CT showing a 1 cm left proximal ureteral stone with hydronephrosis, possible small distal punctate stone  Assessment & Plan:   65 year old male with left knee replacement 1 week ago who presents with 1 day of malaise and was found to have an infected left 1 cm proximal ureteral stone with upstream hydronephrosis.  He has signs of sepsis from urinary source with elevated lactate, hypotension, tachycardia, and leukocytosis.  Also with AKI with creatinine of 2.64 from baseline 0.5.  We discussed the need for drainage in the setting  of an infected and obstructed system.  A ureteral stent is a small plastic tube that is placed cystoscopically with one end in the kidney and the other end in the bladder that allows the infection from the kidney to drain, and relieves pain from the obstructing stone.  We discussed the risks at length including bleeding, infection, sepsis, death, ureteral injury, and stent related symptoms including urgency/frequency/dysuria/flank pain/gross hematuria.  There is a low,  but not 0, risk of inability to pass the ureteral stent alongside the stone from below which would require percutaneous nephrostomy tube by interventional radiology.  Finally, we discussed possible prolonged hospitalization and recovery, possible temporary Foley catheter placement, and 10 to 14-day course of antibiotics.  We reviewed the need for a follow-up procedure for definitive management of their stone when the infection has been treated in 2 to 3 weeks with either ureteroscopy/laser lithotripsy.  Recommendations: OR tonight for urgent left ureteral stent placement Agree with admission to hospitalist for broad-spectrum antibiotics and resuscitation Will notify Dr. Rudene Christians and Ortho service in the morning  [place], [place] [address] [place], [place] [address] [phone]

## 2020-07-26 NOTE — Progress Notes (Signed)
Following for code sepsis 

## 2020-07-27 ENCOUNTER — Other Ambulatory Visit: Payer: Self-pay

## 2020-07-27 ENCOUNTER — Encounter: Payer: Self-pay | Admitting: Urology

## 2020-07-27 LAB — HIV ANTIBODY (ROUTINE TESTING W REFLEX): HIV Screen 4th Generation wRfx: NONREACTIVE

## 2020-07-27 LAB — BASIC METABOLIC PANEL
Anion gap: 11 (ref 5–15)
BUN: 40 mg/dL — ABNORMAL HIGH (ref 8–23)
CO2: 27 mmol/L (ref 22–32)
Calcium: 8 mg/dL — ABNORMAL LOW (ref 8.9–10.3)
Chloride: 97 mmol/L — ABNORMAL LOW (ref 98–111)
Creatinine, Ser: 2.14 mg/dL — ABNORMAL HIGH (ref 0.61–1.24)
GFR, Estimated: 33 mL/min — ABNORMAL LOW (ref >60)
Glucose, Bld: 166 mg/dL — ABNORMAL HIGH (ref 70–99)
Potassium: 3.6 mmol/L (ref 3.5–5.1)
Sodium: 135 mmol/L (ref 135–145)

## 2020-07-27 LAB — CBC WITH DIFFERENTIAL/PLATELET
Abs Immature Granulocytes: 0.7 10*3/uL — ABNORMAL HIGH (ref 0.00–0.07)
Basophils Absolute: 0 10*3/uL (ref 0.0–0.1)
Basophils Relative: 0 %
Eosinophils Absolute: 0 10*3/uL (ref 0.0–0.5)
Eosinophils Relative: 0 %
HCT: 28.5 % — ABNORMAL LOW (ref 39.0–52.0)
Hemoglobin: 9.4 g/dL — ABNORMAL LOW (ref 13.0–17.0)
Immature Granulocytes: 3 %
Lymphocytes Relative: 2 %
Lymphs Abs: 0.3 10*3/uL — ABNORMAL LOW (ref 0.7–4.0)
MCH: 26.8 pg (ref 26.0–34.0)
MCHC: 33 g/dL (ref 30.0–36.0)
MCV: 81.2 fL (ref 80.0–100.0)
Monocytes Absolute: 0.6 10*3/uL (ref 0.1–1.0)
Monocytes Relative: 3 %
Neutro Abs: 19 10*3/uL — ABNORMAL HIGH (ref 1.7–7.7)
Neutrophils Relative %: 92 %
Platelets: 274 10*3/uL (ref 150–400)
RBC: 3.51 MIL/uL — ABNORMAL LOW (ref 4.22–5.81)
RDW: 17.7 % — ABNORMAL HIGH (ref 11.5–15.5)
WBC: 20.7 10*3/uL — ABNORMAL HIGH (ref 4.0–10.5)
nRBC: 0 % (ref 0.0–0.2)

## 2020-07-27 LAB — GLUCOSE, CAPILLARY
Glucose-Capillary: 140 mg/dL — ABNORMAL HIGH (ref 70–99)
Glucose-Capillary: 153 mg/dL — ABNORMAL HIGH (ref 70–99)
Glucose-Capillary: 161 mg/dL — ABNORMAL HIGH (ref 70–99)
Glucose-Capillary: 188 mg/dL — ABNORMAL HIGH (ref 70–99)
Glucose-Capillary: 245 mg/dL — ABNORMAL HIGH (ref 70–99)

## 2020-07-27 LAB — PROTIME-INR
INR: 1.2 (ref 0.8–1.2)
Prothrombin Time: 15.1 seconds (ref 11.4–15.2)

## 2020-07-27 MED ORDER — PREGABALIN 50 MG PO CAPS
100.0000 mg | ORAL_CAPSULE | Freq: Every day | ORAL | Status: DC
  Administered 2020-07-27 – 2020-07-28 (×2): 100 mg via ORAL
  Filled 2020-07-27 (×2): qty 2

## 2020-07-27 MED ORDER — PREGABALIN 50 MG PO CAPS
50.0000 mg | ORAL_CAPSULE | Freq: Three times a day (TID) | ORAL | Status: DC
  Administered 2020-07-27 – 2020-07-29 (×5): 50 mg via ORAL
  Filled 2020-07-27 (×5): qty 1

## 2020-07-27 MED ORDER — CYCLOSPORINE 0.05 % OP EMUL
1.0000 [drp] | Freq: Two times a day (BID) | OPHTHALMIC | Status: DC
  Administered 2020-07-27 – 2020-07-31 (×9): 1 [drp] via OPHTHALMIC
  Filled 2020-07-27 (×12): qty 1

## 2020-07-27 MED ORDER — OXYCODONE HCL 5 MG PO TABS
5.0000 mg | ORAL_TABLET | ORAL | Status: DC | PRN
  Administered 2020-07-27 – 2020-07-29 (×3): 5 mg via ORAL
  Filled 2020-07-27 (×3): qty 1

## 2020-07-27 MED ORDER — ALFUZOSIN HCL ER 10 MG PO TB24
10.0000 mg | ORAL_TABLET | Freq: Every day | ORAL | Status: DC
  Administered 2020-07-27 – 2020-07-29 (×3): 10 mg via ORAL
  Filled 2020-07-27 (×4): qty 1

## 2020-07-27 MED ORDER — DUTASTERIDE 0.5 MG PO CAPS
0.5000 mg | ORAL_CAPSULE | Freq: Every day | ORAL | Status: DC
  Administered 2020-07-27 – 2020-07-29 (×3): 0.5 mg via ORAL
  Filled 2020-07-27 (×3): qty 1

## 2020-07-27 MED ORDER — LINACLOTIDE 145 MCG PO CAPS
145.0000 ug | ORAL_CAPSULE | Freq: Every day | ORAL | Status: DC
  Administered 2020-07-27 – 2020-07-28 (×2): 145 ug via ORAL
  Filled 2020-07-27 (×3): qty 1

## 2020-07-27 MED ORDER — CHLORHEXIDINE GLUCONATE CLOTH 2 % EX PADS
6.0000 | MEDICATED_PAD | Freq: Every day | CUTANEOUS | Status: DC
  Administered 2020-07-27: 6 via TOPICAL

## 2020-07-27 MED ORDER — HYDROMORPHONE HCL 1 MG/ML IJ SOLN
0.5000 mg | INTRAMUSCULAR | Status: DC | PRN
  Administered 2020-07-28 – 2020-07-30 (×3): 0.5 mg via INTRAVENOUS
  Filled 2020-07-27 (×3): qty 0.5

## 2020-07-27 MED ORDER — DOCUSATE SODIUM 100 MG PO CAPS
100.0000 mg | ORAL_CAPSULE | Freq: Two times a day (BID) | ORAL | Status: DC
  Administered 2020-07-27: 100 mg via ORAL
  Filled 2020-07-27 (×2): qty 1

## 2020-07-27 MED ORDER — IPRATROPIUM-ALBUTEROL 0.5-2.5 (3) MG/3ML IN SOLN: 3.0000 mL | Freq: Two times a day (BID) | RESPIRATORY_TRACT | Status: DC | PRN

## 2020-07-27 NOTE — Progress Notes (Signed)
Anticoagulation monitoring(Lovenox):  65 yo male ordered Lovenox 40 mg Q24h  There were no vitals filed for this visit. BMI 32.93    Lab Results  Component Value Date   CREATININE 2.64 (H) 07/26/2020   CREATININE 0.85 07/22/2020   CREATININE 1.05 07/21/2020   Estimated Creatinine Clearance: 36.3 mL/min (A) (by C-G formula based on SCr of 2.64 mg/dL (H)). Hemoglobin & Hematocrit     Component Value Date/Time   HGB 9.8 (L) 07/26/2020 1815   HCT 29.5 (L) 07/26/2020 1815     Per Protocol for Patient with estCrcl > 30 ml/min and BMI > 30, will transition to Lovenox 57.5 mg Q24h.

## 2020-07-27 NOTE — Progress Notes (Signed)
Urology Inpatient Progress Note  Subjective: No acute events overnight.  Last fever at 0225 this morning.  VSS now. WBC count down today, 20.7.  Creatinine down today, 2.14.  Blood cultures pending with no growth at <12 hours, urine culture pending.  On antibiotics as below. Foley catheter in place draining clear, yellow urine. Patient reports feeling well today and denies flank or bladder discomfort.  Anti-infectives: Anti-infectives (From admission, onward)   Start     Dose/Rate Route Frequency Ordered Stop   07/27/20 0100  cefTRIAXone (ROCEPHIN) 2 g in sodium chloride 0.9 % 100 mL IVPB        2 g 200 mL/hr over 30 Minutes Intravenous Every 24 hours 07/26/20 2203     07/26/20 1815  piperacillin-tazobactam (ZOSYN) IVPB 3.375 g        3.375 g 100 mL/hr over 30 Minutes Intravenous STAT 07/26/20 1813 07/26/20 1858   07/26/20 1815  vancomycin (VANCOCIN) IVPB 1000 mg/200 mL premix        1,000 mg 200 mL/hr over 60 Minutes Intravenous STAT 07/26/20 1814 07/26/20 2004      Current Facility-Administered Medications  Medication Dose Route Frequency Provider Last Rate Last Admin  . 0.9 %  sodium chloride infusion   Intravenous Continuous Ralene Muskrat B, MD 150 mL/hr at 07/27/20 0649 Infusion Verify at 07/27/20 0649  . acetaminophen (TYLENOL) tablet 650 mg  650 mg Oral Q6H PRN Cox, Amy N, DO       Or  . acetaminophen (TYLENOL) suppository 650 mg  650 mg Rectal Q6H PRN Cox, Amy N, DO      . azelastine (ASTELIN) 0.1 % nasal spray 1 spray  1 spray Each Nare QHS Cox, Amy N, DO   1 spray at 07/27/20 0059  . cefTRIAXone (ROCEPHIN) 2 g in sodium chloride 0.9 % 100 mL IVPB  2 g Intravenous Q24H Cox, Amy N, DO   Stopped at 07/27/20 0129  . Chlorhexidine Gluconate Cloth 2 % PADS 6 each  6 each Topical Q0600 Cox, Amy N, DO   6 each at 07/27/20 [phone]  . diphenhydrAMINE (BENADRYL) injection 25 mg  25 mg Intravenous PRN Cox, Amy N, DO      . enoxaparin (LOVENOX) injection 57.5 mg  0.5 mg/kg Subcutaneous  Q24H Cox, Amy N, DO      . fluticasone (FLONASE) 50 MCG/ACT nasal spray 2 spray  2 spray Each Nare Daily Cox, Amy N, DO   2 spray at 07/27/20 0742  . fluticasone furoate-vilanterol (BREO ELLIPTA) 100-25 MCG/INH 1 puff  1 puff Inhalation Daily Cox, Amy N, DO   1 puff at 07/27/20 0745  . hydrALAZINE (APRESOLINE) tablet 25 mg  25 mg Oral Q8H PRN Cox, Amy N, DO      . HYDROmorphone (DILAUDID) injection 0.5 mg  0.5 mg Intravenous Q4H PRN Sreenath, Sudheer B, MD      . insulin aspart (novoLOG) injection 0-20 Units  0-20 Units Subcutaneous TID WC Cox, Amy N, DO   4 Units at 07/27/20 0741  . insulin aspart (novoLOG) injection 0-5 Units  0-5 Units Subcutaneous QHS Cox, Amy N, DO      . ipratropium (ATROVENT) 0.06 % nasal spray 2 spray  2 spray Each Nare BID Cox, Amy N, DO   2 spray at 07/27/20 0059  . methylPREDNISolone sodium succinate (SOLU-MEDROL) 40 mg/mL injection 40 mg  40 mg Intravenous Q12H Cox, Amy N, DO      . montelukast (SINGULAIR) tablet 10 mg  10 mg  Oral QHS Cox, Amy N, DO   10 mg at 07/27/20 0059  . ondansetron (ZOFRAN) tablet 4 mg  4 mg Oral Q6H PRN Cox, Amy N, DO       Or  . ondansetron (ZOFRAN) injection 4 mg  4 mg Intravenous Q6H PRN Cox, Amy N, DO      . oxyCODONE (Oxy IR/ROXICODONE) immediate release tablet 5 mg  5 mg Oral Q4H PRN Priscella Mann, Sudheer B, MD      . rosuvastatin (CRESTOR) tablet 40 mg  40 mg Oral Daily Cox, Amy N, DO   40 mg at 07/27/20 0059   Objective: Vital signs in last 24 hours: Temp:  [97.8 F (36.6 C)-100.7 F (38.2 C)] 98.3 F (36.8 C) (04/26 0751) Pulse Rate:  [81-99] 84 (04/26 0900) Resp:  [12-21] 16 (04/26 0900) BP: (72-132)/(40-67) 115/67 (04/26 0800) SpO2:  [90 %-100 %] 95 % (04/26 0900) Weight:  [113.5 kg] 113.5 kg (04/26 0225)  Intake/Output from previous day: 04/25 0701 - 04/26 0700 In: 1706.6 [P.O.:30; I.V.:1176.6; IV Piggyback:500] Out: 575 [Urine:575] Intake/Output this shift: No intake/output data recorded.  Physical Exam Vitals and  nursing note reviewed.  Constitutional:      General: He is not in acute distress.    Appearance: He is not ill-appearing, toxic-appearing or diaphoretic.  HENT:     Head: Normocephalic and atraumatic.  Pulmonary:     Effort: Pulmonary effort is normal. No respiratory distress.  Skin:    General: Skin is warm and dry.  Neurological:     Mental Status: He is alert and oriented to person, place, and time.  Psychiatric:        Mood and Affect: Mood normal.        Behavior: Behavior normal.    Lab Results:  Recent Labs    07/26/20 1815 07/27/20 0753  WBC 21.9* 20.7*  HGB 9.8* 9.4*  HCT 29.5* 28.5*  PLT 313 274   BMET Recent Labs    07/26/20 1815 07/27/20 0753  NA 131* 135  K 3.4* 3.6  CL 91* 97*  CO2 25 27  GLUCOSE 197* 166*  BUN 37* 40*  CREATININE 2.64* 2.14*  CALCIUM 8.6* 8.0*   PT/INR Recent Labs    07/26/20 1815 07/27/20 0358  LABPROT 15.0 15.1  INR 1.2 1.2   Assessment & Plan: 65 year old male POD 1 from cystoscopy with bilateral ureteral stent placement with Dr. Diamantina Providence for management of a proximal left ureteral stone with intraoperative findings of purulent urine draining from the bilateral UOs.  Patient clinically improving and tolerating his stents well today.  I explained that he will require a total of 14 days of culture appropriate antibiotics with plans for outpatient ureteroscopy with laser lithotripsy and stent exchange in approximately 2 to 3 weeks.  He expressed understanding.  Recommendations: -Discontinue Foley catheter once he has been fever free for 24 hours, no sooner than tomorrow morning -Continue empiric antibiotics and follow cultures, he will require a total of 14 days of culture appropriate therapy -Outpatient ureteroscopy with laser lithotripsy and stent exchange with Dr. Diamantina Providence in 2 to 3 weeks  Debroah Loop, PA-C 07/27/2020

## 2020-07-27 NOTE — Progress Notes (Signed)
PROGRESS NOTE    Jacques Fife  IOX:[id number] DOB: 1955-09-09 DOA: 07/26/2020 PCP: Derinda Late, MD   Brief Narrative:  65 y.o. male with medical history significant for chronic pain, hyperlipidemia, hypertension, COPD, history of Hodgkin's lymphoma, stage IV melanoma, previously on nivolumab with past chemotherapy for 5 months after 41 of 55 cycles, vocal cord paralysis, status post stent placed for approximation of cords, OSA, on his fifth device most recent is [place] device replaced with dream tomorrow, baseline exertional dyspnea, obesity, osteoarthritis status post left knee arthroplasty in 07/20/2020, non-insulin-dependent diabetes mellitus, presents to the emergency department for chief concerns of low blood pressure.  Patient states that during occupational therapy, the therapist reported that he had low blood pressure, high heart rate and low O2, thus prompting him to present to the emergency department for further evaluation.  Patient was admitted with working diagnosis of sepsis secondary to 1 cm left proximal ureteral stone with upstream hydronephrosis.  Taken emergently to the operating room by urology for placement of ureteral stent.  Tolerated procedure well.  Sepsis physiology improving afterwards.   Assessment & Plan:   Principal Problem:   Sepsis associated hypotension (HCC) Active Problems:   S/P TKR (total knee replacement) using cement, left   Essential hypertension   AKI (acute kidney injury) (HCC)   Hyperlipidemia  Severe sepsis secondary to obstructive ureteral stone - Increase heart rate, low blood pressure, WBC elevated, lactic acid, organ involvement is renal - Status post Vanco and Zosyn per EDP - Patient has a rash/hives allergy with cefazolin, discussed with pharmacy, we will pretreat with Benadryl and Solu-Medrol prior to hanging ceftriaxone --Emergent ureteral stent placement --Good result, sepsis physiology improving Plan: Continue Rocephin Follow  urine culture for pathogen identification and de-escalation of antibiotics Continue Foley catheter for at least the next 24 hours assuming he remains fever free Urology following, recommendations appreciated Patient will need total 14 days of culture appropriate antibiotics Follow-up outpatient ureteroscopy with laser lithotripsy and stent exchange in 2 to 3 weeks Transfer to MedSurg  COPD resumed home inhalers -Solu-Medrol 40 mg IV twice daily today Not exacerbated likely will not need to continue steroids  Primary osteoarthritis of the left knee status post total knee arthroplasty - Knee arthroplasty was on 07/20/2020 - Wound VAC in place per orthopedic surgeon with no purulence in the wound VAC at this time - therefore I do not suspect this is a source of infection --Urology to speak with orthopedics as necessary  Hyperlipidemia rosuvastatin 40 mg daily  Non-insulin-dependent diabetes mellitus holding metformin due to acute kidney injury at this time,  insulin sliding scale  insulin at bedtime coverage  Neuropathy Started Lyrica per home dose  Hypertension holding home ramipril 10 mg daily, verapamil 180 mg nightly, metoprolol tartrate 25 mg daily, chlorthalidone 25 mg daily due to hypotension -Vitals per unit protocol -Restart antihypertensive as appropriate  # History of melanoma # OSA-CPAP nightly ordered # History of Hodgkin's lymphoma   DVT prophylaxis: SQ Lovenox Code Status: Full Family Communication: None today.  Offered to call but patient declined Disposition Plan: Status is: Inpatient  Remains inpatient appropriate because:Inpatient level of care appropriate due to severity of illness   Dispo: The patient is from: Home              Anticipated d/c is to: Home              Patient currently is not medically stable to d/c.   Difficult to place patient No Severe  sepsis in the setting of obstructive ureteral stone.  Status post ureteral stent  placement.  Sepsis physiology improving.  Possible disposition within 48 hours.  Reevaluate tomorrow for fever curve and potential discontinuation of Foley catheter.      Level of care: Med-Surg  Consultants:   Urology   Procedures:Ureteral stent, 4/25  Antimicrobials:   Rocephin   Subjective: Patient seen and examined.  Endorses lower abdominal pain.  Otherwise asymptomatic.  Objective: Vitals:   07/27/20 0800 07/27/20 0900 07/27/20 1000 07/27/20 1100  BP: 115/67  95/89 (!) 108/56  Pulse: 86 84 90 92  Resp: 16 16 17 15   Temp:      TempSrc:      SpO2: 97% 95% 96% 96%  Weight:      Height:        Intake/Output Summary (Last 24 hours) at 07/27/2020 1338 Last data filed at 07/27/2020 1000 Gross per 24 hour  Intake 1706.59 ml  Output 1225 ml  Net 481.59 ml   Filed Weights   07/27/20 0225  Weight: 113.5 kg    Examination:  General exam: Appears calm and comfortable  Respiratory system: Clear to auscultation. Respiratory effort normal. Cardiovascular system: S1 & S2 heard, RRR. No JVD, murmurs, rubs, gallops or clicks. No pedal edema. Gastrointestinal system: Nondistended, mild tender to palpation lower abdomen.  Normal bowel sounds Central nervous system: Alert and oriented. No focal neurological deficits. Extremities: Symmetric 5 x 5 power. Skin: No rashes, lesions or ulcers Psychiatry: Judgement and insight appear normal. Mood & affect appropriate.     Data Reviewed: I have personally reviewed following labs and imaging studies  CBC: Recent Labs  Lab 07/20/20 1356 07/21/20 0601 07/22/20 0513 07/26/20 1815 07/27/20 0753  WBC 10.1 9.8 11.0* 21.9* 20.7*  NEUTROABS  --   --   --  20.2* 19.0*  HGB 11.9* 11.5* 10.1* 9.8* 9.4*  HCT 36.7* 34.3* 30.5* 29.5* 28.5*  MCV 82.7 80.7 81.1 81.7 81.2  PLT 232 228 217 313 539   Basic Metabolic Panel: Recent Labs  Lab 07/20/20 1356 07/21/20 0601 07/22/20 0513 07/26/20 1815 07/27/20 0753  NA  --  137 133*  131* 135  K  --  3.0* 3.2* 3.4* 3.6  CL  --  98 97* 91* 97*  CO2  --  26 27 25 27   GLUCOSE  --  211* 136* 197* 166*  BUN  --  20 14 37* 40*  CREATININE 1.28* 1.05 0.85 2.64* 2.14*  CALCIUM  --  8.6* 8.3* 8.6* 8.0*   GFR: Estimated Creatinine Clearance: 45.5 mL/min (A) (by C-G formula based on SCr of 2.14 mg/dL (H)). Liver Function Tests: Recent Labs  Lab 07/26/20 1815  AST 26  ALT 25  ALKPHOS 63  BILITOT 0.9  PROT 6.5  ALBUMIN 2.8*   No results for input(s): LIPASE, AMYLASE in the last 168 hours. No results for input(s): AMMONIA in the last 168 hours. Coagulation Profile: Recent Labs  Lab 07/26/20 1815 07/27/20 0358  INR 1.2 1.2   Cardiac Enzymes: No results for input(s): CKTOTAL, CKMB, CKMBINDEX, TROPONINI in the last 168 hours. BNP (last 3 results) No results for input(s): PROBNP in the last 8760 hours. HbA1C: No results for input(s): HGBA1C in the last 72 hours. CBG: Recent Labs  Lab 07/23/20 1212 07/26/20 2325 07/27/20 0019 07/27/20 0725 07/27/20 1207  GLUCAP 158* 118* 140* 161* 153*   Lipid Profile: No results for input(s): CHOL, HDL, LDLCALC, TRIG, CHOLHDL, LDLDIRECT in the last 72 hours.  Thyroid Function Tests: No results for input(s): TSH, T4TOTAL, FREET4, T3FREE, THYROIDAB in the last 72 hours. Anemia Panel: No results for input(s): VITAMINB12, FOLATE, FERRITIN, TIBC, IRON, RETICCTPCT in the last 72 hours. Sepsis Labs: Recent Labs  Lab 07/26/20 1815 07/26/20 2125  LATICACIDVEN 3.1* 2.4*    Recent Results (from the past 240 hour(s))  Resp Panel by RT-PCR (Flu A&B, Covid) Nasopharyngeal Swab     Status: None   Collection Time: 07/26/20  6:15 PM   Specimen: Nasopharyngeal Swab; Nasopharyngeal(NP) swabs in vial transport medium  Result Value Ref Range Status   SARS Coronavirus 2 by RT PCR NEGATIVE NEGATIVE Final    Comment: (NOTE) SARS-CoV-2 target nucleic acids are NOT DETECTED.  The SARS-CoV-2 RNA is generally detectable in upper  respiratory specimens during the acute phase of infection. The lowest concentration of SARS-CoV-2 viral copies this assay can detect is 138 copies/mL. A negative result does not preclude SARS-Cov-2 infection and should not be used as the sole basis for treatment or other patient management decisions. A negative result may occur with  improper specimen collection/handling, submission of specimen other than nasopharyngeal swab, presence of viral mutation(s) within the areas targeted by this assay, and inadequate number of viral copies(<138 copies/mL). A negative result must be combined with clinical observations, patient history, and epidemiological information. The expected result is Negative.  Fact Sheet for Patients:  EntrepreneurPulse.com.au  Fact Sheet for Healthcare Providers:  IncredibleEmployment.be  This test is no t yet approved or cleared by the [place] FDA and  has been authorized for detection and/or diagnosis of SARS-CoV-2 by FDA under an Emergency Use Authorization (EUA). This EUA will remain  in effect (meaning this test can be used) for the duration of the COVID-19 declaration under Section 564(b)(1) of the Act, 21 U.S.C.section 360bbb-3(b)(1), unless the authorization is terminated  or revoked sooner.       Influenza A by PCR NEGATIVE NEGATIVE Final   Influenza B by PCR NEGATIVE NEGATIVE Final    Comment: (NOTE) The Xpert Xpress SARS-CoV-2/FLU/RSV plus assay is intended as an aid in the diagnosis of influenza from Nasopharyngeal swab specimens and should not be used as a sole basis for treatment. Nasal washings and aspirates are unacceptable for Xpert Xpress SARS-CoV-2/FLU/RSV testing.  Fact Sheet for Patients: EntrepreneurPulse.com.au  Fact Sheet for Healthcare Providers: IncredibleEmployment.be  This test is not yet approved or cleared by the [place] FDA and has been  authorized for detection and/or diagnosis of SARS-CoV-2 by FDA under an Emergency Use Authorization (EUA). This EUA will remain in effect (meaning this test can be used) for the duration of the COVID-19 declaration under Section 564(b)(1) of the Act, 21 U.S.C. section 360bbb-3(b)(1), unless the authorization is terminated or revoked.  Performed at [facility], [address] [place], [place] [address]   Blood Culture (routine x 2)     Status: None (Preliminary result)   Collection Time: 07/26/20  6:15 PM   Specimen: BLOOD  Result Value Ref Range Status   Specimen Description BLOOD RIGHT ANTECUBITAL  Final   Special Requests   Final    BOTTLES DRAWN AEROBIC AND ANAEROBIC Blood Culture adequate volume   Culture   Final    NO GROWTH < 12 HOURS Performed at [facility], [address] [place], [place] [address]    Report Status PENDING  Incomplete  Blood Culture (routine x 2)     Status: None (Preliminary result)   Collection Time: 07/26/20  6:15 PM  Specimen: BLOOD  Result Value Ref Range Status   Specimen Description BLOOD LEFT ANTECUBITAL  Final   Special Requests   Final    BOTTLES DRAWN AEROBIC AND ANAEROBIC Blood Culture results may not be optimal due to an inadequate volume of blood received in culture bottles   Culture   Final    NO GROWTH < 12 HOURS Performed at [facility], [address], [place], [place] [address]    Report Status PENDING  Incomplete         Radiology Studies: CT ABDOMEN PELVIS WO CONTRAST  Result Date: 07/26/2020 CLINICAL DATA:  65 year old male with abdominal distension. EXAM: CT ABDOMEN AND PELVIS WITHOUT CONTRAST TECHNIQUE: Multidetector CT imaging of the abdomen and pelvis was performed following the standard protocol without IV contrast. COMPARISON:  None. FINDINGS: Evaluation of this exam is limited in the absence of intravenous contrast. Lower chest: Right lower lobe subpleural subsegmental  atelectasis/scarring. The visualized lung bases are otherwise clear. There is coronary vascular calcification. Partially visualized central venous line with tip in the region of the cavoatrial junction. No intra-abdominal free air or free fluid. Hepatobiliary: There is diffuse fatty liver. No intrahepatic biliary dilatation. The gallbladder is unremarkable. Pancreas: Unremarkable. No pancreatic ductal dilatation or surrounding inflammatory changes. Spleen: Normal in size without focal abnormality. Adrenals/Urinary Tract: The adrenal glands unremarkable. There is an 11 mm stone at the left ureteropelvic junction with mild left hydronephrosis. Additional nonobstructing left renal calculi measure up to 1 cm in the upper pole of the left kidney. There is a punctate nonobstructing stone in the distal left ureter (88/2). Multiple nonobstructing right renal stones. Two adjacent stones in the right renal pelvis with combined length of 12 mm. There is no hydronephrosis on the right. There is a 3 cm right renal inferior pole cyst. The right ureter is unremarkable. The urinary bladder is partially distended. There is air within the bladder which may be related to recent instrumentation. Correlation with urinalysis recommended to exclude superimposed UTI. Stomach/Bowel: There is sigmoid diverticulosis without active inflammatory changes. There is no bowel obstruction or active inflammation. The appendix is normal. Vascular/Lymphatic: Mild aortoiliac atherosclerotic disease. The IVC is unremarkable. The retroaortic left renal vein anatomy. No portal venous gas. There is no adenopathy. Reproductive: The prostate and seminal vesicles are grossly. No pelvic mass. Other: Small fat containing left inguinal hernia. Musculoskeletal: Osteopenia with degenerative changes of the spine. Total left hip arthroplasty. Subcutaneous stranding and edema adjacent to the left hip. No acute osseous pathology. IMPRESSION: 1. A 11 mm left UPJ stone  with mild left hydronephrosis. A punctate nonobstructing stone is also noted the distal left ureter. Additional nonobstructing bilateral renal calculi. No hydronephrosis on the right. 2. Fatty liver. 3. Sigmoid diverticulosis. No bowel obstruction. Normal appendix. 4. Aortic Atherosclerosis (ICD10-I70.0). Electronically Signed   By: Anner Crete M.D.   On: 07/26/2020 20:24   DG Chest Port 1 View  Result Date: 07/26/2020 CLINICAL DATA:  65 year old male with concern for sepsis. EXAM: PORTABLE CHEST 1 VIEW COMPARISON:  None. FINDINGS: Evaluation is limited due to positioning of the patient and superimposition of the jaw over the left apex. Right-sided Port-A-Cath with tip in the region of the cavoatrial junction. There are bibasilar linear atelectasis. No focal consolidation, pleural effusion, pneumothorax. Mild cardiomegaly. No acute osseous pathology. IMPRESSION: 1. No acute cardiopulmonary process. 2. Mild cardiomegaly. Electronically Signed   By: Anner Crete M.D.   On: 07/26/2020 18:52   DG Knee Left Port  Result  Date: 07/26/2020 CLINICAL DATA:  Knee pain.  Sepsis EXAM: PORTABLE LEFT KNEE - 1-2 VIEW COMPARISON:  None. FINDINGS: Post total knee arthroplasty. Prosthetic components appear well seated. Expected soft tissue changes. 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 Electronically Signed   By: Suzy Bouchard M.D.   On: 07/26/2020 18:52   DG OR UROLOGY CYSTO IMAGE ([facility] ONLY)  Result Date: 07/26/2020 There is no interpretation for this exam.  This order is for images obtained during a surgical procedure.  Please See "Surgeries" Tab for more information regarding the procedure.        Scheduled Meds: . azelastine  1 spray Each Nare QHS  . Chlorhexidine Gluconate Cloth  6 each Topical Q0600  . enoxaparin  0.5 mg/kg Subcutaneous Q24H  . fluticasone  2 spray Each Nare Daily  . fluticasone furoate-vilanterol  1 puff Inhalation Daily  . insulin aspart  0-20 Units  Subcutaneous TID WC  . insulin aspart  0-5 Units Subcutaneous QHS  . ipratropium  2 spray Each Nare BID  . methylPREDNISolone (SOLU-MEDROL) injection  40 mg Intravenous Q12H  . montelukast  10 mg Oral QHS  . rosuvastatin  40 mg Oral Daily   Continuous Infusions: . sodium chloride 100 mL/hr at 07/27/20 0900  . cefTRIAXone (ROCEPHIN)  IV Stopped (07/27/20 0129)     LOS: 1 day    Time spent: 25 minutes    Sidney Ace, MD [facility] Pager [phone]  If 7PM-7AM, please contact night-coverage  07/27/2020, 1:38 PM

## 2020-07-27 NOTE — Progress Notes (Signed)
1500 Report called to [facility] on 2C. Also discussed with wife patients memory and recall problems. Wife stated he had not been this way until after his total knee surgery. Discussed this issue with patient could possibly be from anesthesia side effects. Advised wife if he didn't clear mentally in a week to seek medical attention. Wife receptive to suggestions and information.

## 2020-07-27 NOTE — Anesthesia Postprocedure Evaluation (Signed)
Anesthesia Post Note  Patient: Austin Sullivan  Procedure(s) Performed: CYSTOSCOPY WITH STENT PLACEMENT (Bilateral )  Patient location during evaluation: PACU Anesthesia Type: General Level of consciousness: awake and alert Pain management: pain level controlled Vital Signs Assessment: post-procedure vital signs reviewed and stable Respiratory status: spontaneous breathing, nonlabored ventilation, respiratory function stable and patient connected to nasal cannula oxygen Cardiovascular status: blood pressure returned to baseline and stable Postop Assessment: no apparent nausea or vomiting Anesthetic complications: no   No complications documented.   Last Vitals:  Vitals:   07/27/20 0500 07/27/20 0600  BP: (!) 107/57 (!) 110/59  Pulse: 84 81  Resp: 12 13  Temp:    SpO2: 96% 97%    Last Pain:  Vitals:   07/27/20 0400  TempSrc: Oral  PainSc:                  Precious Haws Jacari Kirsten

## 2020-07-27 NOTE — Evaluation (Signed)
Physical Therapy Evaluation Patient Details Name: Karry Causer MRN: [id number] DOB: 1955-05-04 Today's Date: 07/27/2020   History of Present Illness  Pt is a 65 y.o. male with medical history significant for chronic pain, hyperlipidemia, hypertension, COPD, history of Hodgkin's lymphoma, stage IV melanoma with past chemotherapy for 5 months after 41 of 55 cycles, vocal cord paralysis, OSA, baseline exertional dyspnea, obesity, osteoarthritis status post left knee arthroplasty in 07/20/2020, and DM who presented to the emergency department for chief concerns of low blood pressure. MD assessment includes: severe sepsis secondary to obstructive ureteral stone now s/p stent placement and osteoarthritis of the left knee status post total knee arthroplasty 07/20/20.    Clinical Impression  Pt was pleasant and motivated to participate during the session.  Pt required significant extra time, effort, and min A for LLE and trunk management during sup to sit.  Once in sitting pt performed transfers and amb without physical assistance but did require cuing for proper sequencing.  Pt's SpO2 and HR were WNL during the session with pt reporting no adverse symptoms other than mild L knee pain.  Due to pt's recent L TKA will set frequency at 7x/wk.  Pt will benefit from continued HHPT upon discharge to safely address deficits listed in patient problem list for decreased caregiver assistance and eventual return to PLOF.      Follow Up Recommendations Home health PT;Supervision for mobility/OOB (Currently receiving HHPT)    Equipment Recommendations  None recommended by PT    Recommendations for Other Services       Precautions / Restrictions Precautions Precautions: Fall Restrictions Weight Bearing Restrictions: Yes LLE Weight Bearing: Weight bearing as tolerated Other Position/Activity Restrictions: LLE WBAT from recent prior admission      Mobility  Bed Mobility Overal bed mobility: Needs  Assistance Bed Mobility: Supine to Sit     Supine to sit: Min assist     General bed mobility comments: Min A to manage the LLE out of bed and for trunk to full upright position    Transfers Overall transfer level: Needs assistance Equipment used: Rolling walker (2 wheeled) Transfers: Sit to/from Stand Sit to Stand: Min guard         General transfer comment: Min verbal cues for sequencing and L foot placement  Ambulation/Gait Ambulation/Gait assistance: Min guard Gait Distance (Feet): 100 Feet Assistive device: Rolling walker (2 wheeled) Gait Pattern/deviations: Antalgic;Trunk flexed;Step-through pattern;Decreased stance time - left Gait velocity: decreased   General Gait Details: Mod verbal cues for amb closer to the RW with upright posture with pt steady without LOB and with SpO2 and HR WNL  Stairs            Wheelchair Mobility    Modified Rankin (Stroke Patients Only)       Balance Overall balance assessment: Needs assistance Sitting-balance support: Feet supported Sitting balance-Leahy Scale: Good     Standing balance support: Bilateral upper extremity supported;During functional activity Standing balance-Leahy Scale: Fair Standing balance comment: Min to mod lean on the RW for support                             Pertinent Vitals/Pain Pain Assessment: 0-10 Pain Score: 2  Pain Location: L knee with movement Pain Descriptors / Indicators: Sore Pain Intervention(s): Premedicated before session;Monitored during session    Home Living Family/patient expects to be discharged to:: Private residence Living Arrangements: Spouse/significant other Available Help at Discharge: Family;Available 24 hours/day Type  of Home: House Home Access: Stairs to enter Entrance Stairs-Rails: None Entrance Stairs-Number of Steps: 1 Home Layout: One level Home Equipment: Walker - 2 wheels;Crutches;Toilet riser;Shower seat;Hand held shower head Additional  Comments: Toilet elevated with arm rests    Prior Function Level of Independence: Independent         Comments: Ind amb community distances without an AD, Ind with ADLs, no fall history prior to recent L TKA     Hand Dominance        Extremity/Trunk Assessment   Upper Extremity Assessment Upper Extremity Assessment: Overall WFL for tasks assessed    Lower Extremity Assessment Lower Extremity Assessment: Generalized weakness LLE Deficits / Details: s/p L TKA LLE: Unable to fully assess due to pain LLE Sensation: WNL       Communication   Communication: No difficulties  Cognition Arousal/Alertness: Awake/alert Behavior During Therapy: WFL for tasks assessed/performed Overall Cognitive Status: Within Functional Limits for tasks assessed                                        General Comments      Exercises Total Joint Exercises Ankle Circles/Pumps: AROM;Both;10 reps Quad Sets: AROM;Left;5 reps;Strengthening;10 reps Hip ABduction/ADduction: AAROM;Left;5 reps Long Arc Quad: AROM;Left;Strengthening;10 reps;5 reps Knee Flexion: 5 reps;10 reps;Left;Strengthening;AROM Marching in Standing: AROM;Strengthening;Both;5 reps;Standing Other Exercises Other Exercises: Car transfer sequencing verbal education using back seat with education provided secondary to pt stated that he had trouble getting in/out of front passenger seat during recent prior admission Other Exercises: LLE positioning to encourage L knee ext PROM and prevent heel pressure Other Exercises: L knee ROM HEP review with emphasis on QS and LAQ   Assessment/Plan    PT Assessment Patient needs continued PT services  PT Problem List Decreased strength;Decreased range of motion;Decreased activity tolerance;Decreased balance;Decreased mobility;Decreased knowledge of use of DME;Pain       PT Treatment Interventions DME instruction;Gait training;Functional mobility training;Therapeutic  activities;Therapeutic exercise;Balance training;Patient/family education;Stair training    PT Goals (Current goals can be found in the Care Plan section)  Acute Rehab PT Goals Patient Stated Goal: To get better and return home PT Goal Formulation: With patient Time For Goal Achievement: 08/09/20 Potential to Achieve Goals: Good    Frequency 7X/week   Barriers to discharge        Co-evaluation               AM-PAC PT "6 Clicks" Mobility  Outcome Measure Help needed turning from your back to your side while in a flat bed without using bedrails?: A Lot Help needed moving from lying on your back to sitting on the side of a flat bed without using bedrails?: A Lot Help needed moving to and from a bed to a chair (including a wheelchair)?: A Lot Help needed standing up from a chair using your arms (e.g., wheelchair or bedside chair)?: A Little Help needed to walk in hospital room?: A Little Help needed climbing 3-5 steps with a railing? : A Little 6 Click Score: 15    End of Session Equipment Utilized During Treatment: Gait belt Activity Tolerance: Patient tolerated treatment well Patient left: with call bell/phone within reach;in bed;with family/visitor present Nurse Communication: Mobility status PT Visit Diagnosis: Other abnormalities of gait and mobility (R26.89);Muscle weakness (generalized) (M62.81);Pain Pain - Right/Left: Left Pain - part of body: Knee    Time: 4128-7867 PT Time Calculation (min) (  ACUTE ONLY): 52 min   Charges:   PT Evaluation $PT Eval Moderate Complexity: 1 Mod PT Treatments $Therapeutic Exercise: 8-22 mins $Therapeutic Activity: 8-22 mins        D. Royetta Asal PT, DPT 07/27/20, 4:14 PM

## 2020-07-28 ENCOUNTER — Inpatient Hospital Stay: Payer: Medicare Other

## 2020-07-28 ENCOUNTER — Encounter: Payer: Self-pay | Admitting: Internal Medicine

## 2020-07-28 DIAGNOSIS — I959 Hypotension, unspecified: Secondary | ICD-10-CM

## 2020-07-28 DIAGNOSIS — A419 Sepsis, unspecified organism: Principal | ICD-10-CM

## 2020-07-28 DIAGNOSIS — K567 Ileus, unspecified: Secondary | ICD-10-CM

## 2020-07-28 LAB — BLOOD CULTURE ID PANEL (REFLEXED) - BCID2

## 2020-07-28 LAB — CBC WITH DIFFERENTIAL/PLATELET
Abs Immature Granulocytes: 0.36 10*3/uL — ABNORMAL HIGH (ref 0.00–0.07)
Basophils Absolute: 0 10*3/uL (ref 0.0–0.1)
Basophils Relative: 0 %
Eosinophils Absolute: 0 10*3/uL (ref 0.0–0.5)
Eosinophils Relative: 0 %
HCT: 27.6 % — ABNORMAL LOW (ref 39.0–52.0)
Hemoglobin: 9.3 g/dL — ABNORMAL LOW (ref 13.0–17.0)
Immature Granulocytes: 2 %
Lymphocytes Relative: 2 %
Lymphs Abs: 0.4 10*3/uL — ABNORMAL LOW (ref 0.7–4.0)
MCH: 27 pg (ref 26.0–34.0)
MCHC: 33.7 g/dL (ref 30.0–36.0)
MCV: 80.2 fL (ref 80.0–100.0)
Monocytes Absolute: 0.6 10*3/uL (ref 0.1–1.0)
Monocytes Relative: 4 %
Neutro Abs: 16.3 10*3/uL — ABNORMAL HIGH (ref 1.7–7.7)
Neutrophils Relative %: 92 %
Platelets: 325 10*3/uL (ref 150–400)
RBC: 3.44 MIL/uL — ABNORMAL LOW (ref 4.22–5.81)
RDW: 17.7 % — ABNORMAL HIGH (ref 11.5–15.5)
WBC: 17.7 10*3/uL — ABNORMAL HIGH (ref 4.0–10.5)
nRBC: 0 % (ref 0.0–0.2)

## 2020-07-28 LAB — BASIC METABOLIC PANEL
Anion gap: 11 (ref 5–15)
BUN: 47 mg/dL — ABNORMAL HIGH (ref 8–23)
CO2: 28 mmol/L (ref 22–32)
Calcium: 8.2 mg/dL — ABNORMAL LOW (ref 8.9–10.3)
Chloride: 98 mmol/L (ref 98–111)
Creatinine, Ser: 1.81 mg/dL — ABNORMAL HIGH (ref 0.61–1.24)
GFR, Estimated: 41 mL/min — ABNORMAL LOW (ref >60)
Glucose, Bld: 137 mg/dL — ABNORMAL HIGH (ref 70–99)
Potassium: 3.1 mmol/L — ABNORMAL LOW (ref 3.5–5.1)
Sodium: 137 mmol/L (ref 135–145)

## 2020-07-28 LAB — GLUCOSE, CAPILLARY
Glucose-Capillary: 119 mg/dL — ABNORMAL HIGH (ref 70–99)
Glucose-Capillary: 195 mg/dL — ABNORMAL HIGH (ref 70–99)
Glucose-Capillary: 139 mg/dL — ABNORMAL HIGH (ref 70–99)
Glucose-Capillary: 139 mg/dL — ABNORMAL HIGH (ref 70–99)

## 2020-07-28 LAB — C DIFFICILE QUICK SCREEN W PCR REFLEX
C Diff antigen: NEGATIVE
C Diff interpretation: NOT DETECTED
C Diff toxin: NEGATIVE

## 2020-07-28 MED ORDER — METOPROLOL TARTRATE 25 MG PO TABS
12.5000 mg | ORAL_TABLET | Freq: Two times a day (BID) | ORAL | Status: DC
  Administered 2020-07-28 – 2020-07-29 (×3): 12.5 mg via ORAL
  Filled 2020-07-28 (×3): qty 1

## 2020-07-28 MED ORDER — CHLORHEXIDINE GLUCONATE CLOTH 2 % EX PADS
6.0000 | MEDICATED_PAD | Freq: Every day | CUTANEOUS | Status: DC
  Administered 2020-07-28 – 2020-07-31 (×4): 6 via TOPICAL

## 2020-07-28 MED ORDER — POTASSIUM CHLORIDE CRYS ER 20 MEQ PO TBCR
40.0000 meq | EXTENDED_RELEASE_TABLET | ORAL | Status: AC
  Administered 2020-07-28 (×2): 40 meq via ORAL
  Filled 2020-07-28 (×2): qty 2

## 2020-07-28 MED ORDER — SACCHAROMYCES BOULARDII 250 MG PO CAPS
250.0000 mg | ORAL_CAPSULE | Freq: Two times a day (BID) | ORAL | Status: DC
  Administered 2020-07-28 – 2020-07-29 (×3): 250 mg via ORAL
  Filled 2020-07-28 (×3): qty 1

## 2020-07-28 NOTE — Progress Notes (Signed)
Physical Therapy Treatment Patient Details Name: Austin Sullivan MRN: [id number] DOB: 03-Apr-1956 Today's Date: 07/28/2020    History of Present Illness Pt is a 65 y.o. male with medical history significant for chronic pain, hyperlipidemia, hypertension, COPD, history of Hodgkin's lymphoma, stage IV melanoma with past chemotherapy for 5 months after 41 of 55 cycles, vocal cord paralysis, OSA, baseline exertional dyspnea, obesity, osteoarthritis status post left knee arthroplasty in 07/20/2020, and DM who presented to the emergency department for chief concerns of low blood pressure. MD assessment includes: severe sepsis secondary to obstructive ureteral stone now s/p stent placement and osteoarthritis of the left knee status post total knee arthroplasty 07/20/20.    PT Comments    Pt was pleasant and motivated to participate during the session.  Pt put forth good effort but fatigued quickly and required frequent short therapeutic rest breaks.  Pt attributed fatigue to recent bouts of diarrhea.  Despite fatigue pt did not require physical assist during the session, just extra time and effort to complete functional tasks.  Pt reported no adverse symptoms during the session other than mild L knee pain. Pt will benefit from HHPT upon discharge to safely address deficits listed in patient problem list for decreased caregiver assistance and eventual return to PLOF.     Follow Up Recommendations  Home health PT;Supervision for mobility/OOB     Equipment Recommendations  None recommended by PT    Recommendations for Other Services       Precautions / Restrictions Precautions Precautions: Fall Restrictions Weight Bearing Restrictions: Yes LLE Weight Bearing: Weight bearing as tolerated Other Position/Activity Restrictions: LLE WBAT from recent prior admission    Mobility  Bed Mobility Overal bed mobility: Modified Independent             General bed mobility comments: Extra time and effort  only    Transfers Overall transfer level: Needs assistance Equipment used: Rolling walker (2 wheeled) Transfers: Sit to/from Stand Sit to Stand: Min guard         General transfer comment: Min verbal cues for sequencing and L foot placement  Ambulation/Gait Ambulation/Gait assistance: Min guard Gait Distance (Feet): 30 Feet Assistive device: Rolling walker (2 wheeled) Gait Pattern/deviations: Antalgic;Trunk flexed;Step-through pattern;Decreased stance time - left Gait velocity: decreased   General Gait Details: Min verbal cues for amb closer to the RW with upright posture with pt steady without LOB and with SpO2 and HR WNL   Stairs             Wheelchair Mobility    Modified Rankin (Stroke Patients Only)       Balance Overall balance assessment: Needs assistance Sitting-balance support: Feet supported Sitting balance-Leahy Scale: Good     Standing balance support: Bilateral upper extremity supported;During functional activity Standing balance-Leahy Scale: Fair Standing balance comment: Min to mod lean on the RW for support                            Cognition Arousal/Alertness: Awake/alert Behavior During Therapy: WFL for tasks assessed/performed Overall Cognitive Status: Within Functional Limits for tasks assessed                                        Exercises Total Joint Exercises Ankle Circles/Pumps: AROM;Both;10 reps;15 reps Quad Sets: AROM;Left;Strengthening;10 reps;15 reps Heel Slides: AAROM;Left;10 reps;5 reps Hip ABduction/ADduction: AAROM;10 reps;Strengthening;Both;5 reps [place]  on the LLE) Straight Leg Raises: AAROM;10 reps;Strengthening;Both;5 reps (AAROM on the LLE) Long Arc Quad: AROM;Left;Strengthening;10 reps;15 reps Knee Flexion: 10 reps;Left;Strengthening;AROM;15 reps Goniometric ROM: L knee AROM: 3-90 deg Marching in Standing: AROM;Strengthening;Both;Standing;10 reps Other Exercises Other Exercises: L  knee ROM HEP review with emphasis on QS and LAQ    General Comments        Pertinent Vitals/Pain Pain Assessment: 0-10 Pain Score: 3  Pain Location: L knee Pain Descriptors / Indicators: Sore;Aching Pain Intervention(s): Premedicated before session;Monitored during session    Home Living                      Prior Function            PT Goals (current goals can now be found in the care plan section) Progress towards PT goals: Progressing toward goals    Frequency    7X/week      PT Plan Current plan remains appropriate    Co-evaluation              AM-PAC PT "6 Clicks" Mobility   Outcome Measure  Help needed turning from your back to your side while in a flat bed without using bedrails?: A Little Help needed moving from lying on your back to sitting on the side of a flat bed without using bedrails?: A Little Help needed moving to and from a bed to a chair (including a wheelchair)?: A Little Help needed standing up from a chair using your arms (e.g., wheelchair or bedside chair)?: A Little Help needed to walk in hospital room?: A Little Help needed climbing 3-5 steps with a railing? : A Little 6 Click Score: 18    End of Session Equipment Utilized During Treatment: Gait belt Activity Tolerance: Patient tolerated treatment well Patient left: in chair;with call bell/phone within reach;with chair alarm set Nurse Communication: Mobility status PT Visit Diagnosis: Other abnormalities of gait and mobility (R26.89);Muscle weakness (generalized) (M62.81);Pain Pain - Right/Left: Left Pain - part of body: Knee     Time: 1005-1049 PT Time Calculation (min) (ACUTE ONLY): 44 min  Charges:  $Gait Training: 8-22 mins $Therapeutic Exercise: 8-22 mins $Therapeutic Activity: 8-22 mins                     D. Scott Kyrel Leighton PT, DPT 07/28/20, 1:25 PM

## 2020-07-28 NOTE — TOC Initial Note (Signed)
Transition of Care [facility]) - Initial/Assessment Note    Patient Details  Name: Orlander Norwood MRN: [id number] Date of Birth: [birth year]  Transition of Care [facility]) CM/SW Contact:    Alberteen Sam, LCSW Phone Number: 07/28/2020, 1:22 PM  Clinical Narrative:                  Spoke with patient concerning discharge plan, plans to go home with home health. Would like to resume services with [facility] for PT and OT. [place] with [facility] has been informed, will be notified at time of discharge to resume services.  Reports at last admission received 3 in 1 and RW through [employer], no other equipment needs identified at this time.   Patient expresses no questions or concerns.   Expected Discharge Plan: [place] Barriers to Discharge: Continued Medical Work up   Patient Goals and CMS Choice Patient states their goals for this hospitalization and ongoing recovery are:: to go home      Expected Discharge Plan and Services Expected Discharge Plan: [place] Acute Care Choice: [place] arrangements for the past 2 months: [place]: PT,OT [place] Agency: [facility] [employer] [facility] [employer] Date [place]: 07/28/20 Time [place]: Long Representative spoke with at [place]: [place]  Prior Living Arrangements/Services Living arrangements for the past 2 months: [place] with:: Spouse   Do you feel safe going back to the place where you live?: Yes      Need for Family Participation in Patient Care: Yes (Comment) Care giver support system in place?: Yes (comment) Current home services: DME,Home PT,Home OT    Activities of Daily Living Home Assistive Devices/Equipment: Environmental consultant (specify type) ADL Screening (condition at time of admission) Patient's cognitive ability adequate to safely complete daily activities?: Yes Is the patient deaf or have  difficulty hearing?: Yes Does the patient have difficulty seeing, even when wearing glasses/contacts?: No Does the patient have difficulty concentrating, remembering, or making decisions?: No Patient able to express need for assistance with ADLs?: Yes Does the patient have difficulty dressing or bathing?: No Independently performs ADLs?: Yes (appropriate for developmental age) Does the patient have difficulty walking or climbing stairs?: Yes Weakness of Legs: Left Weakness of Arms/Hands: None  Permission Sought/Granted                  Emotional Assessment       Orientation: : Oriented to Self,Oriented to Place,Oriented to  Time,Oriented to Situation Alcohol / Substance Use: Not Applicable Psych Involvement: No (comment)  Admission diagnosis:  Sepsis associated hypotension ([place]) [A41.9, I95.9] Patient Active Problem List   Diagnosis Date Noted  . Sepsis associated hypotension ([place]) 07/26/2020  . Essential hypertension 07/26/2020  . AKI (acute kidney injury) ([place]) 07/26/2020  . Hyperlipidemia 07/26/2020  . S/P TKR (total knee replacement) using cement, left 07/20/2020   PCP:  Derinda Late, MD Pharmacy:   [facility], [place] [address] [place] [address] Phone: [phone] Fax: [phone]     Social Determinants of Health (SDOH) Interventions    Readmission Risk Interventions No flowsheet data found.

## 2020-07-28 NOTE — Progress Notes (Signed)
PHARMACY - PHYSICIAN COMMUNICATION CRITICAL VALUE ALERT - BLOOD CULTURE IDENTIFICATION (BCID)  Nic Lampe is an 65 y.o. male with history of Hodgkin's lymphoma, stage IV melanoma (previousl on nivolumab with past chemo for 5 months after 41 of 55 cycles) who presented to [facility] on 07/26/2020 with a chief complaint of hypotension. Pt currently on ceftriaxone with E coli in urine culture. PT had recent knee arthroplasty 07/20/20.   Assessment:  1/4 bottles (anaerobic) GPC - staph species (include suspected source if known) - possibly L knee or contaminant, could cover with vancomycin if warranted  Name of physician (or Provider) Contacted: Dr. Myna Hidalgo  Current antibiotics: ceftriaxone   Changes to prescribed antibiotics recommended:  Since pt seems to be stable/improving, provider will let attending assess need for vancomycin in the morning.  Results for orders placed or performed during the hospital encounter of 07/26/20  Blood Culture ID Panel (Reflexed) (Collected: 07/26/2020  6:15 PM)  Result Value Ref Range   Enterococcus faecalis NOT DETECTED NOT DETECTED   Enterococcus Faecium NOT DETECTED NOT DETECTED   Listeria monocytogenes NOT DETECTED NOT DETECTED   Staphylococcus species DETECTED (A) NOT DETECTED   Staphylococcus aureus (BCID) NOT DETECTED NOT DETECTED   Staphylococcus epidermidis NOT DETECTED NOT DETECTED   Staphylococcus lugdunensis NOT DETECTED NOT DETECTED   Streptococcus species NOT DETECTED NOT DETECTED   Streptococcus agalactiae NOT DETECTED NOT DETECTED   Streptococcus pneumoniae NOT DETECTED NOT DETECTED   Streptococcus pyogenes NOT DETECTED NOT DETECTED   A.calcoaceticus-baumannii NOT DETECTED NOT DETECTED   Bacteroides fragilis NOT DETECTED NOT DETECTED   Enterobacterales NOT DETECTED NOT DETECTED   Enterobacter cloacae complex NOT DETECTED NOT DETECTED   Escherichia coli NOT DETECTED NOT DETECTED   Klebsiella aerogenes NOT DETECTED NOT DETECTED   Klebsiella  oxytoca NOT DETECTED NOT DETECTED   Klebsiella pneumoniae NOT DETECTED NOT DETECTED   Proteus species NOT DETECTED NOT DETECTED   Salmonella species NOT DETECTED NOT DETECTED   Serratia marcescens NOT DETECTED NOT DETECTED   Haemophilus influenzae NOT DETECTED NOT DETECTED   Neisseria meningitidis NOT DETECTED NOT DETECTED   Pseudomonas aeruginosa NOT DETECTED NOT DETECTED   Stenotrophomonas maltophilia NOT DETECTED NOT DETECTED   Candida albicans NOT DETECTED NOT DETECTED   Candida auris NOT DETECTED NOT DETECTED   Candida glabrata NOT DETECTED NOT DETECTED   Candida krusei NOT DETECTED NOT DETECTED   Candida parapsilosis NOT DETECTED NOT DETECTED   Candida tropicalis NOT DETECTED NOT DETECTED   Cryptococcus neoformans/gattii NOT DETECTED NOT [place], PharmD Pharmacy Resident  07/28/2020 8:26 PM

## 2020-07-28 NOTE — Consult Note (Signed)
ORTHOPAEDICS: The patient is a 65 year old male who underwent left total knee arthroplasty on 07/20/2020 as per Dr.Menz. He had been receiving home Physical therapy and was noted to be hypotensive on the date of admission. He was admitted for treatment of a left proximal ureteral stone and urinary sepsis. He subsequently underwent ureteral stent placement by Dr. Diamantina [place].  Patient was soiled from BM. Nursing aware and preparing to clean patient. Exam was deferred.  Notes from physical therapy were reviewed. Agree with plans to continue physical therapy as per total knee arthroplasty rehab protocol. He may be weightbearing as tolerated.  Tao P. Holley Bouche M.D.

## 2020-07-28 NOTE — Progress Notes (Addendum)
PROGRESS NOTE    Austin Sullivan  EXH:[id number] DOB: 1955/11/30 DOA: 07/26/2020 PCP: Derinda Late, MD   Brief Narrative:  65 y.o. male with medical history significant for chronic pain, hyperlipidemia, hypertension, COPD, history of Hodgkin's lymphoma, stage IV melanoma, previously on nivolumab with past chemotherapy for 5 months after 41 of 55 cycles, vocal cord paralysis, status post stent placed for approximation of cords, OSA, on his fifth device most recent is [place] device replaced with dream tomorrow, baseline exertional dyspnea, obesity, osteoarthritis status post left knee arthroplasty in 07/20/2020, non-insulin-dependent diabetes mellitus, presents to the emergency department for chief concerns of low blood pressure.  Patient states that during occupational therapy, the therapist reported that he had low blood pressure, high heart rate and low O2, thus prompting him to present to the emergency department for further evaluation.  Patient was admitted with working diagnosis of sepsis secondary to 1 cm left proximal ureteral stone with upstream hydronephrosis.  Taken emergently to the operating room by urology for placement of ureteral stent.  Tolerated procedure well.  Sepsis physiology improving afterwards.   Assessment & Plan:   Principal Problem:   Sepsis associated hypotension (HCC) Active Problems:   S/P TKR (total knee replacement) using cement, left   Essential hypertension   AKI (acute kidney injury) ([place])   Hyperlipidemia  Severe sepsis secondary to obstructive ureteral stone - Increase heart rate, low blood pressure, WBC elevated, lactic acid, organ involvement is renal - Status post Vanco and Zosyn per EDP - Patient has a rash/hives allergy with cefazolin, discussed with pharmacy,  will pretreat with Benadryl and Solu-Medrol prior to hanging ceftriaxone --Emergent ureteral stent placement --Good result, sepsis physiology improving Plan: Continue Rocephin, Follow  urine culture for pathogen identification and de-escalation of antibiotics, blood culture no growth Continue Foley catheter for at least the next 24 hours assuming he remains fever free Urology following, recommendations appreciated Patient will need total 14 days of culture appropriate antibiotics Follow-up outpatient ureteroscopy with laser lithotripsy and stent exchange in 2 to 3 weeks Has been on IV hydration, now improved oral intake, DC hydration  Diarrhea: He reported diarrhea last night, reported history of C. difficile  C. Difficile test was ordered last night Start probiotics DC stool softener Monitor  Hypokalemia, replace K, check mag  Hyponatremia, present on admission Resolved after hydration   AKI on CKD 2 -Likely due to sepsis -Baseline creatinine 0.8 -Creatinine on presentation 2.64 -Creatinine improving 1.8 today -Renal dosing medication, repeat BMP in the morning  COPD resumed home inhalers -He received Solu-Medrol 40 mg IV x1 on 4/26 -There is no wheezing on exam , no cough , no short of breath ,not exacerbated likely will not need to continue steroids  Primary osteoarthritis of the left knee status post total knee arthroplasty - Knee arthroplasty was on 07/20/2020 - Wound VAC in place per orthopedic surgeon with no purulence in the wound VAC at this time - therefore I do not suspect this is a source of infection -- Weightbearing as tolerated -Left lower extremity edema, likely due to surgery, will check venous Doppler for completeness  Hyperlipidemia rosuvastatin 40 mg daily  Non-insulin-dependent diabetes mellitus holding metformin due to acute kidney injury at this time,  insulin sliding scale  insulin at bedtime coverage A.m. blood glucose 137 will check A1c Change to carb modified diet  Neuropathy Started Lyrica per home dose  Hypertension holding home ramipril 10 mg daily, verapamil 180 mg nightly, metoprolol tartrate 25 mg daily,  chlorthalidone 25  mg daily due to hypotension -Vitals per unit protocol -Restart antihypertensive as appropriate, resume low dose lopressor today  # History of melanoma # OSA-CPAP nightly ordered # History of Hodgkin's lymphoma  Body mass index is 32.13 kg/m.    DVT prophylaxis: SQ Lovenox Code Status: Full Family Communication: None today.  Offered to call but patient declined Disposition Plan: Status is: Inpatient  Remains inpatient appropriate because:Inpatient level of care appropriate due to severity of illness   Dispo: The patient is from: Home              Anticipated d/c is to: Home              Patient currently is not medically stable to d/c.   Difficult to place patient No   Need wec. Cr improvement, need home health PT, possible d/c in 48hrs Monitor  fever curve and potential discontinuation of Foley catheter.      Level of care: Med-Surg  Consultants:   Urology   Procedures:Ureteral stent, 4/25  Antimicrobials:   Rocephin   Subjective: Patient seen and examined.  Sitting up in chair, reports large bm last night, denies ab pain, reports remote h/o c diff  c diff testing ordered last night,  No bm this am, d/c stool softener, start probiotics Foley in place with clear urine,  C/o left knee pain and left lower extremity edema  Objective: Vitals:   07/27/20 1558 07/27/20 2139 07/28/20 0435 07/28/20 0822  BP: 113/60 118/63 126/69 131/62  Pulse: 85 89 91 96  Resp: 16 18 18 18   Temp: 98.3 F (36.8 C) 98.2 F (36.8 C) 98.3 F (36.8 C) 97.8 F (36.6 C)  TempSrc:    Oral  SpO2: (!) 89% 96% 97% 95%  Weight:      Height:        Intake/Output Summary (Last 24 hours) at 07/28/2020 1133 Last data filed at 07/28/2020 0839 Gross per 24 hour  Intake --  Output 3350 ml  Net -3350 ml   Filed Weights   07/27/20 0225  Weight: 113.5 kg    Examination:  General exam: Appears calm and comfortable , sitting up in chair, foley in place Respiratory  system: Clear to auscultation. Respiratory effort normal. Cardiovascular system: S1 & S2 heard, RRR. No JVD, murmurs, rubs, gallops or clicks. No pedal edema. Gastrointestinal system: Nondistended, mild tender to palpation lower abdomen.  Normal bowel sounds Central nervous system: Alert and oriented. No focal neurological deficits. Extremities: left knee post op changes, left lower extremity pitting edema Skin: No rashes, lesions or ulcers Psychiatry: Judgement and insight appear normal. Mood & affect appropriate.     Data Reviewed: I have personally reviewed following labs and imaging studies  CBC: Recent Labs  Lab 07/22/20 0513 07/26/20 1815 07/27/20 0753 07/28/20 0431  WBC 11.0* 21.9* 20.7* 17.7*  NEUTROABS  --  20.2* 19.0* 16.3*  HGB 10.1* 9.8* 9.4* 9.3*  HCT 30.5* 29.5* 28.5* 27.6*  MCV 81.1 81.7 81.2 80.2  PLT 217 313 274 [id number]   Basic Metabolic Panel: Recent Labs  Lab 07/22/20 0513 07/26/20 1815 07/27/20 0753 07/28/20 0431  NA 133* 131* 135 137  K 3.2* 3.4* 3.6 3.1*  CL 97* 91* 97* 98  CO2 27 25 27 28   GLUCOSE 136* 197* 166* 137*  BUN 14 37* 40* 47*  CREATININE 0.85 2.64* 2.14* 1.81*  CALCIUM 8.3* 8.6* 8.0* 8.2*   GFR: Estimated Creatinine Clearance: 53.8 mL/min (A) (by C-G formula based on SCr of  1.81 mg/dL (H)). Liver Function Tests: Recent Labs  Lab 07/26/20 1815  AST 26  ALT 25  ALKPHOS 63  BILITOT 0.9  PROT 6.5  ALBUMIN 2.8*   No results for input(s): LIPASE, AMYLASE in the last 168 hours. No results for input(s): AMMONIA in the last 168 hours. Coagulation Profile: Recent Labs  Lab 07/26/20 1815 07/27/20 0358  INR 1.2 1.2   Cardiac Enzymes: No results for input(s): CKTOTAL, CKMB, CKMBINDEX, TROPONINI in the last 168 hours. BNP (last 3 results) No results for input(s): PROBNP in the last 8760 hours. HbA1C: No results for input(s): HGBA1C in the last 72 hours. CBG: Recent Labs  Lab 07/27/20 0725 07/27/20 1207 07/27/20 1735  07/27/20 2152 07/28/20 0749  GLUCAP 161* 153* 188* 245* 119*   Lipid Profile: No results for input(s): CHOL, HDL, LDLCALC, TRIG, CHOLHDL, LDLDIRECT in the last 72 hours. Thyroid Function Tests: No results for input(s): TSH, T4TOTAL, FREET4, T3FREE, THYROIDAB in the last 72 hours. Anemia Panel: No results for input(s): VITAMINB12, FOLATE, FERRITIN, TIBC, IRON, RETICCTPCT in the last 72 hours. Sepsis Labs: Recent Labs  Lab 07/26/20 1815 07/26/20 2125  LATICACIDVEN 3.1* 2.4*    Recent Results (from the past 240 hour(s))  Resp Panel by RT-PCR (Flu A&B, Covid) Nasopharyngeal Swab     Status: None   Collection Time: 07/26/20  6:15 PM   Specimen: Nasopharyngeal Swab; Nasopharyngeal(NP) swabs in vial transport medium  Result Value Ref Range Status   SARS Coronavirus 2 by RT PCR NEGATIVE NEGATIVE Final    Comment: (NOTE) SARS-CoV-2 target nucleic acids are NOT DETECTED.  The SARS-CoV-2 RNA is generally detectable in upper respiratory specimens during the acute phase of infection. The lowest concentration of SARS-CoV-2 viral copies this assay can detect is 138 copies/mL. A negative result does not preclude SARS-Cov-2 infection and should not be used as the sole basis for treatment or other patient management decisions. A negative result may occur with  improper specimen collection/handling, submission of specimen other than nasopharyngeal swab, presence of viral mutation(s) within the areas targeted by this assay, and inadequate number of viral copies(<138 copies/mL). A negative result must be combined with clinical observations, patient history, and epidemiological information. The expected result is Negative.  Fact Sheet for Patients:  EntrepreneurPulse.com.au  Fact Sheet for Healthcare Providers:  IncredibleEmployment.be  This test is no t yet approved or cleared by the [place] FDA and  has been authorized for detection and/or  diagnosis of SARS-CoV-2 by FDA under an Emergency Use Authorization (EUA). This EUA will remain  in effect (meaning this test can be used) for the duration of the COVID-19 declaration under Section 564(b)(1) of the Act, 21 U.S.C.section 360bbb-3(b)(1), unless the authorization is terminated  or revoked sooner.       Influenza A by PCR NEGATIVE NEGATIVE Final   Influenza B by PCR NEGATIVE NEGATIVE Final    Comment: (NOTE) The Xpert Xpress SARS-CoV-2/FLU/RSV plus assay is intended as an aid in the diagnosis of influenza from Nasopharyngeal swab specimens and should not be used as a sole basis for treatment. Nasal washings and aspirates are unacceptable for Xpert Xpress SARS-CoV-2/FLU/RSV testing.  Fact Sheet for Patients: EntrepreneurPulse.com.au  Fact Sheet for Healthcare Providers: IncredibleEmployment.be  This test is not yet approved or cleared by the [place] FDA and has been authorized for detection and/or diagnosis of SARS-CoV-2 by FDA under an Emergency Use Authorization (EUA). This EUA will remain in effect (meaning this test can be used) for the duration of  the COVID-19 declaration under Section 564(b)(1) of the Act, 21 U.S.C. section 360bbb-3(b)(1), unless the authorization is terminated or revoked.  Performed at [facility], [address] [place], [place] [address]   Blood Culture (routine x 2)     Status: None (Preliminary result)   Collection Time: 07/26/20  6:15 PM   Specimen: BLOOD  Result Value Ref Range Status   Specimen Description BLOOD RIGHT ANTECUBITAL  Final   Special Requests   Final    BOTTLES DRAWN AEROBIC AND ANAEROBIC Blood Culture adequate volume   Culture   Final    NO GROWTH 2 DAYS Performed at [facility], [address] [place], [place] [address]    Report Status PENDING  Incomplete  Blood Culture (routine x 2)     Status: None (Preliminary result)   Collection Time:  07/26/20  6:15 PM   Specimen: BLOOD  Result Value Ref Range Status   Specimen Description BLOOD LEFT ANTECUBITAL  Final   Special Requests   Final    BOTTLES DRAWN AEROBIC AND ANAEROBIC Blood Culture results may not be optimal due to an inadequate volume of blood received in culture bottles   Culture   Final    NO GROWTH 2 DAYS Performed at [facility], [address] [place], [place] [address]    Report Status PENDING  Incomplete  Urine culture     Status: Abnormal (Preliminary result)   Collection Time: 07/26/20  6:15 PM   Specimen: Urine, Random  Result Value Ref Range Status   Specimen Description   Final    URINE, RANDOM Performed at [facility], [address], [place], [place] [address]    Special Requests   Final    NONE Performed at [facility], [address] [place], [place] [address]    Culture (A)  Final    >=100,000 COLONIES/mL ESCHERICHIA COLI SUSCEPTIBILITIES TO FOLLOW Performed at [facility], [place] [address], [place],  [address]    Report Status PENDING  Incomplete         Radiology Studies: CT ABDOMEN PELVIS WO CONTRAST  Result Date: 07/26/2020 CLINICAL DATA:  65 year old male with abdominal distension. EXAM: CT ABDOMEN AND PELVIS WITHOUT CONTRAST TECHNIQUE: Multidetector CT imaging of the abdomen and pelvis was performed following the standard protocol without IV contrast. COMPARISON:  None. FINDINGS: Evaluation of this exam is limited in the absence of intravenous contrast. Lower chest: Right lower lobe subpleural subsegmental atelectasis/scarring. The visualized lung bases are otherwise clear. There is coronary vascular calcification. Partially visualized central venous line with tip in the region of the cavoatrial junction. No intra-abdominal free air or free fluid. Hepatobiliary: There is diffuse fatty liver. No intrahepatic biliary dilatation. The gallbladder is unremarkable. Pancreas: Unremarkable. No  pancreatic ductal dilatation or surrounding inflammatory changes. Spleen: Normal in size without focal abnormality. Adrenals/Urinary Tract: The adrenal glands unremarkable. There is an 11 mm stone at the left ureteropelvic junction with mild left hydronephrosis. Additional nonobstructing left renal calculi measure up to 1 cm in the upper pole of the left kidney. There is a punctate nonobstructing stone in the distal left ureter (88/2). Multiple nonobstructing right renal stones. Two adjacent stones in the right renal pelvis with combined length of 12 mm. There is no hydronephrosis on the right. There is a 3 cm right renal inferior pole cyst. The right ureter is unremarkable. The urinary bladder is partially distended. There is air within the bladder which may be related  to recent instrumentation. Correlation with urinalysis recommended to exclude superimposed UTI. Stomach/Bowel: There is sigmoid diverticulosis without active inflammatory changes. There is no bowel obstruction or active inflammation. The appendix is normal. Vascular/Lymphatic: Mild aortoiliac atherosclerotic disease. The IVC is unremarkable. The retroaortic left renal vein anatomy. No portal venous gas. There is no adenopathy. Reproductive: The prostate and seminal vesicles are grossly. No pelvic mass. Other: Small fat containing left inguinal hernia. Musculoskeletal: Osteopenia with degenerative changes of the spine. Total left hip arthroplasty. Subcutaneous stranding and edema adjacent to the left hip. No acute osseous pathology. IMPRESSION: 1. A 11 mm left UPJ stone with mild left hydronephrosis. A punctate nonobstructing stone is also noted the distal left ureter. Additional nonobstructing bilateral renal calculi. No hydronephrosis on the right. 2. Fatty liver. 3. Sigmoid diverticulosis. No bowel obstruction. Normal appendix. 4. Aortic Atherosclerosis (ICD10-I70.0). Electronically Signed   By: Anner Crete M.D.   On: 07/26/2020 20:24   DG  Chest Port 1 View  Result Date: 07/26/2020 CLINICAL DATA:  65 year old male with concern for sepsis. EXAM: PORTABLE CHEST 1 VIEW COMPARISON:  None. FINDINGS: Evaluation is limited due to positioning of the patient and superimposition of the jaw over the left apex. Right-sided Port-A-Cath with tip in the region of the cavoatrial junction. There are bibasilar linear atelectasis. No focal consolidation, pleural effusion, pneumothorax. Mild cardiomegaly. No acute osseous pathology. IMPRESSION: 1. No acute cardiopulmonary process. 2. Mild cardiomegaly. Electronically Signed   By: Anner Crete M.D.   On: 07/26/2020 18:52   DG Knee Left Port  Result Date: 07/26/2020 CLINICAL DATA:  Knee pain.  Sepsis EXAM: PORTABLE LEFT KNEE - 1-2 VIEW COMPARISON:  None. FINDINGS: Post total knee arthroplasty. Prosthetic components appear well seated. Expected soft tissue changes. 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 Electronically Signed   By: Suzy Bouchard M.D.   On: 07/26/2020 18:52   DG OR UROLOGY CYSTO IMAGE ([facility] ONLY)  Result Date: 07/26/2020 There is no interpretation for this exam.  This order is for images obtained during a surgical procedure.  Please See "Surgeries" Tab for more information regarding the procedure.        Scheduled Meds: . alfuzosin  10 mg Oral Daily  . azelastine  1 spray Each Nare QHS  . Chlorhexidine Gluconate Cloth  6 each Topical Daily  . cycloSPORINE  1 drop Both Eyes BID  . docusate sodium  100 mg Oral BID  . dutasteride  0.5 mg Oral Daily  . enoxaparin  0.5 mg/kg Subcutaneous Q24H  . fluticasone  2 spray Each Nare Daily  . fluticasone furoate-vilanterol  1 puff Inhalation Daily  . insulin aspart  0-20 Units Subcutaneous TID WC  . insulin aspart  0-5 Units Subcutaneous QHS  . ipratropium  2 spray Each Nare BID  . linaclotide  145 mcg Oral Daily  . montelukast  10 mg Oral QHS  . pregabalin  100 mg Oral QHS  . pregabalin  50 mg Oral TID with meals   . rosuvastatin  40 mg Oral Daily   Continuous Infusions: . sodium chloride 100 mL/hr (07/27/20 1515)  . cefTRIAXone (ROCEPHIN)  IV Stopped (07/28/20 0936)     LOS: 2 days    Time spent: 35 minutes    Florencia Reasons, MD PhD FACP [facility] Pager [phone]  If 7PM-7AM, please contact night-coverage  07/28/2020, 11:33 AM

## 2020-07-28 NOTE — Progress Notes (Signed)
Cross-coverage note:   KUB suggestive of ileus. Patient with increasing abdominal pain and distension despite bowel rest. Plan for decompression with NGT.

## 2020-07-29 ENCOUNTER — Inpatient Hospital Stay: Payer: Medicare Other

## 2020-07-29 LAB — URINE CULTURE: Culture: >=100000 — AB

## 2020-07-29 LAB — CBC WITH DIFFERENTIAL/PLATELET
Abs Immature Granulocytes: 0.19 10*3/uL — ABNORMAL HIGH (ref 0.00–0.07)
Basophils Absolute: 0 10*3/uL (ref 0.0–0.1)
Basophils Relative: 0 %
Eosinophils Absolute: 0 10*3/uL (ref 0.0–0.5)
Eosinophils Relative: 0 %
HCT: 30.2 % — ABNORMAL LOW (ref 39.0–52.0)
Hemoglobin: 10.1 g/dL — ABNORMAL LOW (ref 13.0–17.0)
Immature Granulocytes: 1 %
Lymphocytes Relative: 3 %
Lymphs Abs: 0.5 10*3/uL — ABNORMAL LOW (ref 0.7–4.0)
MCH: 26.8 pg (ref 26.0–34.0)
MCHC: 33.4 g/dL (ref 30.0–36.0)
MCV: 80.1 fL (ref 80.0–100.0)
Monocytes Absolute: 0.6 10*3/uL (ref 0.1–1.0)
Monocytes Relative: 4 %
Neutro Abs: 14 10*3/uL — ABNORMAL HIGH (ref 1.7–7.7)
Neutrophils Relative %: 92 %
Platelets: 362 10*3/uL (ref 150–400)
RBC: 3.77 MIL/uL — ABNORMAL LOW (ref 4.22–5.81)
RDW: 17.6 % — ABNORMAL HIGH (ref 11.5–15.5)
WBC: 15.4 10*3/uL — ABNORMAL HIGH (ref 4.0–10.5)
nRBC: 0 % (ref 0.0–0.2)

## 2020-07-29 LAB — BASIC METABOLIC PANEL
Anion gap: 11 (ref 5–15)
BUN: 36 mg/dL — ABNORMAL HIGH (ref 8–23)
CO2: 28 mmol/L (ref 22–32)
Calcium: 9 mg/dL (ref 8.9–10.3)
Chloride: 102 mmol/L (ref 98–111)
Creatinine, Ser: 1.61 mg/dL — ABNORMAL HIGH (ref 0.61–1.24)
GFR, Estimated: 47 mL/min — ABNORMAL LOW (ref >60)
Glucose, Bld: 127 mg/dL — ABNORMAL HIGH (ref 70–99)
Potassium: 3.6 mmol/L (ref 3.5–5.1)
Sodium: 141 mmol/L (ref 135–145)

## 2020-07-29 LAB — GLUCOSE, CAPILLARY
Glucose-Capillary: 121 mg/dL — ABNORMAL HIGH (ref 70–99)
Glucose-Capillary: 148 mg/dL — ABNORMAL HIGH (ref 70–99)
Glucose-Capillary: 133 mg/dL — ABNORMAL HIGH (ref 70–99)
Glucose-Capillary: 125 mg/dL — ABNORMAL HIGH (ref 70–99)

## 2020-07-29 LAB — HEMOGLOBIN A1C
Hgb A1c MFr Bld: 7.4 % — ABNORMAL HIGH (ref 4.8–5.6)
Mean Plasma Glucose: 165.68 mg/dL

## 2020-07-29 LAB — MAGNESIUM: Magnesium: 2 mg/dL (ref 1.7–2.4)

## 2020-07-29 LAB — LACTIC ACID, PLASMA: Lactic Acid, Venous: 0.9 mmol/L (ref 0.5–1.9)

## 2020-07-29 MED ORDER — MONTELUKAST SODIUM 10 MG PO TABS: 10.0000 mg | ORAL_TABLET | Freq: Every day | ORAL | Status: DC

## 2020-07-29 MED ORDER — PREGABALIN 50 MG PO CAPS
100.0000 mg | ORAL_CAPSULE | Freq: Every day | ORAL | Status: DC
  Administered 2020-07-29 – 2020-07-30 (×2): 100 mg via ORAL
  Filled 2020-07-29 (×3): qty 2

## 2020-07-29 MED ORDER — ONDANSETRON HCL 4 MG PO TABS: 4.0000 mg | ORAL_TABLET | Freq: Four times a day (QID) | ORAL | Status: DC | PRN

## 2020-07-29 MED ORDER — METOPROLOL TARTRATE 5 MG/5ML IV SOLN: 2.5000 mg | Freq: Two times a day (BID) | INTRAVENOUS | Status: DC

## 2020-07-29 MED ORDER — ROSUVASTATIN CALCIUM 10 MG PO TABS
40.0000 mg | ORAL_TABLET | Freq: Every day | ORAL | Status: DC
  Administered 2020-07-30 – 2020-07-31 (×2): 40 mg via ORAL
  Filled 2020-07-29 (×2): qty 4

## 2020-07-29 MED ORDER — SODIUM CHLORIDE 0.9 % IV SOLN: INTRAVENOUS | Status: AC

## 2020-07-29 MED ORDER — PREGABALIN 50 MG PO CAPS: 100.0000 mg | ORAL_CAPSULE | Freq: Every day | ORAL | Status: DC

## 2020-07-29 MED ORDER — MONTELUKAST SODIUM 10 MG PO TABS
10.0000 mg | ORAL_TABLET | Freq: Every day | ORAL | Status: DC
  Administered 2020-07-29 – 2020-07-30 (×2): 10 mg via ORAL
  Filled 2020-07-29 (×2): qty 1

## 2020-07-29 MED ORDER — IOHEXOL 9 MG/ML PO SOLN: 500.0000 mL | ORAL | Status: DC

## 2020-07-29 MED ORDER — METOPROLOL TARTRATE 25 MG PO TABS
12.5000 mg | ORAL_TABLET | Freq: Two times a day (BID) | ORAL | Status: DC
  Administered 2020-07-29 – 2020-07-31 (×4): 12.5 mg via ORAL
  Filled 2020-07-29 (×4): qty 1

## 2020-07-29 MED ORDER — IOHEXOL 9 MG/ML PO SOLN
500.0000 mL | ORAL | Status: DC
  Administered 2020-07-29: 500 mL via ORAL

## 2020-07-29 MED ORDER — OXYCODONE HCL 5 MG PO TABS
5.0000 mg | ORAL_TABLET | ORAL | Status: DC | PRN
  Administered 2020-07-30: 5 mg via ORAL
  Filled 2020-07-29: qty 1

## 2020-07-29 MED ORDER — ALFUZOSIN HCL ER 10 MG PO TB24
10.0000 mg | ORAL_TABLET | Freq: Every day | ORAL | Status: DC
  Administered 2020-07-30 – 2020-07-31 (×2): 10 mg via ORAL
  Filled 2020-07-29 (×2): qty 1

## 2020-07-29 MED ORDER — METOPROLOL TARTRATE 25 MG PO TABS: 12.5000 mg | ORAL_TABLET | Freq: Two times a day (BID) | ORAL | Status: DC

## 2020-07-29 MED ORDER — POLYETHYLENE GLYCOL 3350 17 G PO PACK
17.0000 g | PACK | Freq: Two times a day (BID) | ORAL | Status: DC
  Administered 2020-07-30: 17 g via ORAL
  Filled 2020-07-29: qty 1

## 2020-07-29 MED ORDER — ROSUVASTATIN CALCIUM 10 MG PO TABS: 40.0000 mg | ORAL_TABLET | Freq: Every day | ORAL | Status: DC

## 2020-07-29 MED ORDER — POTASSIUM CHLORIDE 10 MEQ/100ML IV SOLN
10.0000 meq | INTRAVENOUS | Status: DC
  Administered 2020-07-29: 10 meq via INTRAVENOUS
  Filled 2020-07-29: qty 100

## 2020-07-29 MED ORDER — SACCHAROMYCES BOULARDII 250 MG PO CAPS
250.0000 mg | ORAL_CAPSULE | Freq: Two times a day (BID) | ORAL | Status: DC
  Filled 2020-07-29: qty 1

## 2020-07-29 MED ORDER — DICYCLOMINE HCL 10 MG PO CAPS
10.0000 mg | ORAL_CAPSULE | Freq: Three times a day (TID) | ORAL | Status: DC | PRN
  Filled 2020-07-29: qty 1

## 2020-07-29 MED ORDER — POTASSIUM CHLORIDE 20 MEQ PO PACK
40.0000 meq | PACK | Freq: Once | ORAL | Status: AC
  Administered 2020-07-29: 40 meq via ORAL
  Filled 2020-07-29: qty 2

## 2020-07-29 MED ORDER — POLYETHYLENE GLYCOL 3350 17 G PO PACK: 17.0000 g | PACK | Freq: Two times a day (BID) | ORAL | Status: DC

## 2020-07-29 MED ORDER — DUTASTERIDE 0.5 MG PO CAPS
0.5000 mg | ORAL_CAPSULE | Freq: Every day | ORAL | Status: DC
  Administered 2020-07-30 – 2020-07-31 (×2): 0.5 mg via ORAL
  Filled 2020-07-29 (×2): qty 1

## 2020-07-29 MED ORDER — PREGABALIN 50 MG PO CAPS: 50.0000 mg | ORAL_CAPSULE | Freq: Three times a day (TID) | ORAL | Status: DC

## 2020-07-29 MED ORDER — DICYCLOMINE HCL 10 MG PO CAPS
10.0000 mg | ORAL_CAPSULE | Freq: Three times a day (TID) | ORAL | Status: DC | PRN
  Administered 2020-07-30 – 2020-07-31 (×2): 10 mg via ORAL
  Filled 2020-07-29 (×4): qty 1

## 2020-07-29 MED ORDER — SACCHAROMYCES BOULARDII 250 MG PO CAPS
250.0000 mg | ORAL_CAPSULE | Freq: Two times a day (BID) | ORAL | Status: DC
  Administered 2020-07-29 – 2020-07-31 (×4): 250 mg via ORAL
  Filled 2020-07-29 (×5): qty 1

## 2020-07-29 MED ORDER — OXYCODONE HCL 5 MG PO TABS: 5.0000 mg | ORAL_TABLET | ORAL | Status: DC | PRN

## 2020-07-29 MED ORDER — ACETAMINOPHEN 325 MG PO TABS
650.0000 mg | ORAL_TABLET | Freq: Four times a day (QID) | ORAL | Status: DC | PRN
  Administered 2020-07-30 – 2020-07-31 (×2): 650 mg via ORAL
  Filled 2020-07-29 (×2): qty 2

## 2020-07-29 MED ORDER — PREGABALIN 50 MG PO CAPS
50.0000 mg | ORAL_CAPSULE | Freq: Three times a day (TID) | ORAL | Status: DC
  Administered 2020-07-29: 50 mg
  Filled 2020-07-29: qty 1

## 2020-07-29 MED ORDER — LINACLOTIDE 145 MCG PO CAPS
145.0000 ug | ORAL_CAPSULE | Freq: Every day | ORAL | Status: DC
  Administered 2020-07-29: 145 ug

## 2020-07-29 MED ORDER — PREGABALIN 50 MG PO CAPS
50.0000 mg | ORAL_CAPSULE | Freq: Three times a day (TID) | ORAL | Status: DC
  Administered 2020-07-30 – 2020-07-31 (×4): 50 mg via ORAL
  Filled 2020-07-29 (×4): qty 1

## 2020-07-29 MED ORDER — ONDANSETRON HCL 4 MG/2ML IJ SOLN: 4.0000 mg | Freq: Four times a day (QID) | INTRAMUSCULAR | Status: DC | PRN

## 2020-07-29 MED ORDER — ACETAMINOPHEN 650 MG RE SUPP: 650.0000 mg | Freq: Four times a day (QID) | RECTAL | Status: DC | PRN

## 2020-07-29 MED ORDER — LINACLOTIDE 145 MCG PO CAPS
145.0000 ug | ORAL_CAPSULE | Freq: Every day | ORAL | Status: DC
  Administered 2020-07-30 – 2020-07-31 (×2): 145 ug via ORAL
  Filled 2020-07-29 (×2): qty 1

## 2020-07-29 MED ORDER — IOHEXOL 9 MG/ML PO SOLN: 500.0000 mL | ORAL | Status: AC

## 2020-07-29 MED ORDER — ACETAMINOPHEN 325 MG PO TABS: 650.0000 mg | ORAL_TABLET | Freq: Four times a day (QID) | ORAL | Status: DC | PRN

## 2020-07-29 NOTE — Progress Notes (Signed)
PROGRESS NOTE    Austin Sullivan  YBO:[id number] DOB: 1955/10/25 DOA: 07/26/2020 PCP: Derinda Late, MD   Brief Narrative:  65 y.o. male with medical history significant for chronic pain, hyperlipidemia, hypertension, COPD, history of Hodgkin's lymphoma, stage IV melanoma, previously on nivolumab with past chemotherapy for 5 months after 41 of 55 cycles, vocal cord paralysis, status post stent placed for approximation of cords, OSA, on his fifth device most recent is [place] device replaced with dream tomorrow, baseline exertional dyspnea, obesity, osteoarthritis status post left knee arthroplasty in 07/20/2020, non-insulin-dependent diabetes mellitus, presents to the emergency department for chief concerns of low blood pressure.  Patient states that during occupational therapy, the therapist reported that he had low blood pressure, high heart rate and low O2, thus prompting him to present to the emergency department for further evaluation.  Patient was admitted with working diagnosis of sepsis secondary to 1 cm left proximal ureteral stone with upstream hydronephrosis.  Taken emergently to the operating room by urology for placement of ureteral stent.  Tolerated procedure well.  Sepsis physiology improving afterwards.   Assessment & Plan:   Principal Problem:   Sepsis associated hypotension (HCC) Active Problems:   S/P TKR (total knee replacement) using cement, left   Essential hypertension   AKI (acute kidney injury) (HCC)   Hyperlipidemia   Ileus (HCC)  Severe sepsis secondary to obstructive ureteral stone - Increase heart rate, low blood pressure, WBC elevated, lactic acid, organ involvement is renal - Status post Vanco and Zosyn per EDP - Patient has a rash/hives allergy with cefazolin, discussed with pharmacy,  will pretreat with Benadryl and Solu-Medrol prior to hanging ceftriaxone --Emergent ureteral stent placement, --Good result, sepsis physiology improving Plan: Continue  Rocephin, urine culture  + ecoli sensitive to rocephin, will continue for now due to on ng suction, transition to oral keflex once able to take oral meds ( need total of 14days abx)  , blood culture no growth Continue Foley catheter for at least the next 24 hours assuming he remains fever free Urology following, recommendations appreciated Follow-up outpatient ureteroscopy with laser lithotripsy and stent exchange in 2 to 3 weeks Has been on IV hydration, now improved oral intake, DC hydration  Ileus/costiption /overflow diarrhea: NG placed on 4/27 pm, will give enema, increase activity  Hypokalemia, continue to replace K, mag wnl  Hyponatremia, present on admission Resolved after hydration   AKI on CKD 2 -Likely due to sepsis -Baseline creatinine 0.8 -Creatinine on presentation 2.64 -Creatinine improving 1.6 today -Renal dosing medication, repeat BMP in the morning  COPD resumed home inhalers -He received Solu-Medrol 40 mg IV x1 on 4/26 -There is no wheezing on exam , no cough , no short of breath ,not exacerbated likely will not need to continue steroids  Primary osteoarthritis of the left knee status post total knee arthroplasty - Knee arthroplasty was on 07/20/2020 - Wound VAC in place per orthopedic surgeon with no purulence in the wound VAC at this time - therefore I do not suspect this is a source of infection -- Weightbearing as tolerated -Left lower extremity edema, likely due to surgery, will check venous Doppler for completeness  Hyperlipidemia rosuvastatin 40 mg daily  Non-insulin-dependent diabetes mellitus holding metformin due to acute kidney injury at this time,  insulin sliding scale  insulin at bedtime coverage A.m. blood glucose 137 will check A1c Change to carb modified diet  Neuropathy Started Lyrica per home dose  Hypertension holding home ramipril 10 mg daily, verapamil 180 mg  nightly, metoprolol tartrate 25 mg daily, chlorthalidone 25 mg  daily due to hypotension -Vitals per unit protocol -Restart antihypertensive as appropriate, resume low dose lopressor today  # History of melanoma # OSA-CPAP nightly ordered # History of Hodgkin's lymphoma  Body mass index is 32.13 kg/m.    DVT prophylaxis: SQ Lovenox Code Status: Full Family Communication: None today.  Offered to call but patient declined Disposition Plan: Status is: Inpatient  Remains inpatient appropriate because:Inpatient level of care appropriate due to severity of illness   Dispo: The patient is from: Home              Anticipated d/c is to: Home              Patient currently is not medically stable to d/c.   Difficult to place patient No   On ng suction   potential discontinuation of Foley catheter in 24hrs      Level of care: Med-Surg  Consultants:   Urology  Ortho    Procedures:Ureteral stent, 4/25  Foley placement  Ng placement  Antimicrobials:   Rocephin   Subjective: Patient seen and examined.  Sitting up in chair, on ng suction, ab still distended Foley in place with clear urine,  Left knee wound vac removed this am C/o left knee pain and left lower extremity edema  Objective: Vitals:   07/28/20 1413 07/28/20 2022 07/29/20 0332 07/29/20 0752  BP: 111/62 126/67 (!) 130/59 (!) 146/65  Pulse: (!) 104 (!) 102 95 90  Resp:  16 18 20   Temp: 98.7 F (37.1 C) 97.8 F (36.6 C) 97.8 F (36.6 C) 99.1 F (37.3 C)  TempSrc: Oral  Oral Oral  SpO2: 97% 93% 96% 91%  Weight:      Height:        Intake/Output Summary (Last 24 hours) at 07/29/2020 1144 Last data filed at 07/29/2020 0209 Gross per 24 hour  Intake 1658.46 ml  Output 3975 ml  Net -2316.54 ml   Filed Weights   07/27/20 0225  Weight: 113.5 kg    Examination:  General exam: Appears calm and comfortable , sitting up in chair, foley in place Respiratory system: Clear to auscultation. Respiratory effort normal. Cardiovascular system: S1 & S2 heard, RRR. No  JVD, murmurs, rubs, gallops or clicks. No pedal edema. Gastrointestinal system: distended, mild tender to palpation lower abdomen.  Decreased bowel sounds Central nervous system: Alert and oriented. No focal neurological deficits. Extremities: left knee post op changes, left lower extremity pitting edema Skin: No rashes, lesions or ulcers Psychiatry: Judgement and insight appear normal. Mood & affect appropriate.     Data Reviewed: I have personally reviewed following labs and imaging studies  CBC: Recent Labs  Lab 07/26/20 1815 07/27/20 0753 07/28/20 0431 07/29/20 0533  WBC 21.9* 20.7* 17.7* 15.4*  NEUTROABS 20.2* 19.0* 16.3* 14.0*  HGB 9.8* 9.4* 9.3* 10.1*  HCT 29.5* 28.5* 27.6* 30.2*  MCV 81.7 81.2 80.2 80.1  PLT 313 274 325 [id number]   Basic Metabolic Panel: Recent Labs  Lab 07/26/20 1815 07/27/20 0753 07/28/20 0431 07/29/20 0533  NA 131* 135 137 141  K 3.4* 3.6 3.1* 3.6  CL 91* 97* 98 102  CO2 25 27 28 28   GLUCOSE 197* 166* 137* 127*  BUN 37* 40* 47* 36*  CREATININE 2.64* 2.14* 1.81* 1.61*  CALCIUM 8.6* 8.0* 8.2* 9.0  MG  --   --   --  2.0   GFR: Estimated Creatinine Clearance: 60.5 mL/min (A) (by C-G  formula based on SCr of 1.61 mg/dL (H)). Liver Function Tests: Recent Labs  Lab 07/26/20 1815  AST 26  ALT 25  ALKPHOS 63  BILITOT 0.9  PROT 6.5  ALBUMIN 2.8*   No results for input(s): LIPASE, AMYLASE in the last 168 hours. No results for input(s): AMMONIA in the last 168 hours. Coagulation Profile: Recent Labs  Lab 07/26/20 1815 07/27/20 0358  INR 1.2 1.2   Cardiac Enzymes: No results for input(s): CKTOTAL, CKMB, CKMBINDEX, TROPONINI in the last 168 hours. BNP (last 3 results) No results for input(s): PROBNP in the last 8760 hours. HbA1C: No results for input(s): HGBA1C in the last 72 hours. CBG: Recent Labs  Lab 07/28/20 0749 07/28/20 1139 07/28/20 1626 07/28/20 2128 07/29/20 0759  GLUCAP 119* 195* 139* 139* 121*   Lipid Profile: No  results for input(s): CHOL, HDL, LDLCALC, TRIG, CHOLHDL, LDLDIRECT in the last 72 hours. Thyroid Function Tests: No results for input(s): TSH, T4TOTAL, FREET4, T3FREE, THYROIDAB in the last 72 hours. Anemia Panel: No results for input(s): VITAMINB12, FOLATE, FERRITIN, TIBC, IRON, RETICCTPCT in the last 72 hours. Sepsis Labs: Recent Labs  Lab 07/26/20 1815 07/26/20 2125 07/29/20 0533  LATICACIDVEN 3.1* 2.4* 0.9    Recent Results (from the past 240 hour(s))  Resp Panel by RT-PCR (Flu A&B, Covid) Nasopharyngeal Swab     Status: None   Collection Time: 07/26/20  6:15 PM   Specimen: Nasopharyngeal Swab; Nasopharyngeal(NP) swabs in vial transport medium  Result Value Ref Range Status   SARS Coronavirus 2 by RT PCR NEGATIVE NEGATIVE Final    Comment: (NOTE) SARS-CoV-2 target nucleic acids are NOT DETECTED.  The SARS-CoV-2 RNA is generally detectable in upper respiratory specimens during the acute phase of infection. The lowest concentration of SARS-CoV-2 viral copies this assay can detect is 138 copies/mL. A negative result does not preclude SARS-Cov-2 infection and should not be used as the sole basis for treatment or other patient management decisions. A negative result may occur with  improper specimen collection/handling, submission of specimen other than nasopharyngeal swab, presence of viral mutation(s) within the areas targeted by this assay, and inadequate number of viral copies(<138 copies/mL). A negative result must be combined with clinical observations, patient history, and epidemiological information. The expected result is Negative.  Fact Sheet for Patients:  EntrepreneurPulse.com.au  Fact Sheet for Healthcare Providers:  IncredibleEmployment.be  This test is no t yet approved or cleared by the [place] FDA and  has been authorized for detection and/or diagnosis of SARS-CoV-2 by FDA under an Emergency Use Authorization (EUA).  This EUA will remain  in effect (meaning this test can be used) for the duration of the COVID-19 declaration under Section 564(b)(1) of the Act, 21 U.S.C.section 360bbb-3(b)(1), unless the authorization is terminated  or revoked sooner.       Influenza A by PCR NEGATIVE NEGATIVE Final   Influenza B by PCR NEGATIVE NEGATIVE Final    Comment: (NOTE) The Xpert Xpress SARS-CoV-2/FLU/RSV plus assay is intended as an aid in the diagnosis of influenza from Nasopharyngeal swab specimens and should not be used as a sole basis for treatment. Nasal washings and aspirates are unacceptable for Xpert Xpress SARS-CoV-2/FLU/RSV testing.  Fact Sheet for Patients: EntrepreneurPulse.com.au  Fact Sheet for Healthcare Providers: IncredibleEmployment.be  This test is not yet approved or cleared by the [place] FDA and has been authorized for detection and/or diagnosis of SARS-CoV-2 by FDA under an Emergency Use Authorization (EUA). This EUA will remain in effect (meaning this  test can be used) for the duration of the COVID-19 declaration under Section 564(b)(1) of the Act, 21 U.S.C. section 360bbb-3(b)(1), unless the authorization is terminated or revoked.  Performed at [facility], [address] [place], [place] [address]   Blood Culture (routine x 2)     Status: None (Preliminary result)   Collection Time: 07/26/20  6:15 PM   Specimen: BLOOD  Result Value Ref Range Status   Specimen Description BLOOD RIGHT ANTECUBITAL  Final   Special Requests   Final    BOTTLES DRAWN AEROBIC AND ANAEROBIC Blood Culture adequate volume   Culture  Setup Time   Final    ANAEROBIC BOTTLE ONLY GRAM POSITIVE COCCI Organism ID to follow CRITICAL RESULT CALLED TO, READ BACK BY AND VERIFIED WITH: SAMANTHA RAUER @2018  07/28/20 MJU Performed at [facility], [address] [place], [place] [address]    Culture GRAM POSITIVE COCCI  Final   Report  Status PENDING  Incomplete  Blood Culture (routine x 2)     Status: None (Preliminary result)   Collection Time: 07/26/20  6:15 PM   Specimen: BLOOD  Result Value Ref Range Status   Specimen Description BLOOD LEFT ANTECUBITAL  Final   Special Requests   Final    BOTTLES DRAWN AEROBIC AND ANAEROBIC Blood Culture results may not be optimal due to an inadequate volume of blood received in culture bottles   Culture   Final    NO GROWTH 2 DAYS Performed at [facility], [address] [place], [place] [address]    Report Status PENDING  Incomplete  Urine culture     Status: Abnormal   Collection Time: 07/26/20  6:15 PM   Specimen: Urine, Random  Result Value Ref Range Status   Specimen Description   Final    URINE, RANDOM Performed at [facility], [address], [place], [place] [address]    Special Requests   Final    NONE Performed at [facility], [address] [place], [place] [address]    Culture >=100,000 COLONIES/mL ESCHERICHIA COLI (A)  Final   Report Status 07/29/2020 FINAL  Final   Organism ID, Bacteria ESCHERICHIA COLI (A)  Final      Susceptibility   Escherichia coli - MIC*    AMPICILLIN >=32 RESISTANT Resistant     CEFAZOLIN <=4 SENSITIVE Sensitive     CEFEPIME <=0.12 SENSITIVE Sensitive     CEFTRIAXONE <=0.25 SENSITIVE Sensitive     CIPROFLOXACIN 0.5 SENSITIVE Sensitive     GENTAMICIN <=1 SENSITIVE Sensitive     IMIPENEM <=0.25 SENSITIVE Sensitive     NITROFURANTOIN <=16 SENSITIVE Sensitive     TRIMETH/SULFA <=20 SENSITIVE Sensitive     AMPICILLIN/SULBACTAM >=32 RESISTANT Resistant     PIP/TAZO <=4 SENSITIVE Sensitive     * >=100,000 COLONIES/mL ESCHERICHIA COLI  Blood Culture ID Panel (Reflexed)     Status: Abnormal   Collection Time: 07/26/20  6:15 PM  Result Value Ref Range Status   Enterococcus faecalis NOT DETECTED NOT DETECTED Final   Enterococcus Faecium NOT DETECTED NOT DETECTED Final   Listeria monocytogenes NOT  DETECTED NOT DETECTED Final   Staphylococcus species DETECTED (A) NOT DETECTED Final    Comment: CRITICAL RESULT CALLED TO, READ BACK BY AND VERIFIED WITH: SAMANTHA RAUER @2018  07/28/20 MJU    Staphylococcus aureus (BCID) NOT DETECTED NOT DETECTED Final   Staphylococcus epidermidis NOT DETECTED NOT DETECTED Final   Staphylococcus lugdunensis NOT DETECTED NOT DETECTED Final  Streptococcus species NOT DETECTED NOT DETECTED Final   Streptococcus agalactiae NOT DETECTED NOT DETECTED Final   Streptococcus pneumoniae NOT DETECTED NOT DETECTED Final   Streptococcus pyogenes NOT DETECTED NOT DETECTED Final   A.calcoaceticus-baumannii NOT DETECTED NOT DETECTED Final   Bacteroides fragilis NOT DETECTED NOT DETECTED Final   Enterobacterales NOT DETECTED NOT DETECTED Final   Enterobacter cloacae complex NOT DETECTED NOT DETECTED Final   Escherichia coli NOT DETECTED NOT DETECTED Final   Klebsiella aerogenes NOT DETECTED NOT DETECTED Final   Klebsiella oxytoca NOT DETECTED NOT DETECTED Final   Klebsiella pneumoniae NOT DETECTED NOT DETECTED Final   Proteus species NOT DETECTED NOT DETECTED Final   Salmonella species NOT DETECTED NOT DETECTED Final   Serratia marcescens NOT DETECTED NOT DETECTED Final   Haemophilus influenzae NOT DETECTED NOT DETECTED Final   Neisseria meningitidis NOT DETECTED NOT DETECTED Final   Pseudomonas aeruginosa NOT DETECTED NOT DETECTED Final   Stenotrophomonas maltophilia NOT DETECTED NOT DETECTED Final   Candida albicans NOT DETECTED NOT DETECTED Final   Candida auris NOT DETECTED NOT DETECTED Final   Candida glabrata NOT DETECTED NOT DETECTED Final   Candida krusei NOT DETECTED NOT DETECTED Final   Candida parapsilosis NOT DETECTED NOT DETECTED Final   Candida tropicalis NOT DETECTED NOT DETECTED Final   Cryptococcus neoformans/gattii NOT DETECTED NOT DETECTED Final    Comment: Performed at [facility], [address] [place], [place] [address]  C  Difficile Quick Screen w PCR reflex     Status: None   Collection Time: 07/28/20 10:15 PM   Specimen: STOOL  Result Value Ref Range Status   C Diff antigen NEGATIVE NEGATIVE Final   C Diff toxin NEGATIVE NEGATIVE Final   C Diff interpretation No C. difficile detected.  Final    Comment: Performed at [facility], [address] [place], [place] [address]         Radiology Studies: DG Abd 1 View  Result Date: 07/29/2020 CLINICAL DATA:  Nasogastric tube placement EXAM: ABDOMEN - 1 VIEW COMPARISON:  07/28/2020 FINDINGS: Nasogastric tube tip seen within the proximal to mid body of the stomach. The visualized abdominal gas pattern is unremarkable. Pelvis excluded from view. Bilateral ureteral stents are partially visualized overlying the expected renal pelves bilaterally. IMPRESSION: Nasogastric tube tip within the proximal to mid body of the stomach. Electronically Signed   By: Fidela Salisbury MD   On: 07/29/2020 01:28   DG Abd 1 View  Result Date: 07/28/2020 CLINICAL DATA:  Abdominal distension.  Constipation. EXAM: ABDOMEN - 1 VIEW COMPARISON:  Abdominal CT 2 days ago. FINDINGS: Bilateral ureteral stents in place. Renal calculi on prior CT are not well visualized on the current exam. Increased air throughout small and large bowel. Small volume of formed stool in the colon. Left hip arthroplasty. IMPRESSION: Increased air throughout small and large bowel suggesting ileus. Bilateral ureteral stents in place. Electronically Signed   By: Keith Rake M.D.   On: 07/28/2020 20:46   US Venous Img Lower Unilateral Left (DVT)  Result Date: 07/28/2020 CLINICAL DATA:  Left leg edema since knee replacement Pain EXAM: LEFT LOWER EXTREMITY VENOUS DOPPLER ULTRASOUND TECHNIQUE: Gray-scale sonography with compression, as well as color and duplex ultrasound, were performed to evaluate the deep venous system(s) from the level of the common femoral vein through the popliteal and proximal calf veins.  COMPARISON:  None. FINDINGS: VENOUS Normal compressibility of the common femoral, superficial femoral, and popliteal veins, as well as the visualized  calf veins. Visualized portions of profunda femoral vein and great saphenous vein unremarkable. No filling defects to suggest DVT on grayscale or color Doppler imaging. Doppler waveforms show normal direction of venous flow, normal respiratory plasticity and response to augmentation. Limited views of the contralateral common femoral vein are unremarkable. OTHER None. Limitations: none IMPRESSION: No left lower extremity DVT Electronically Signed   By: Miachel Roux M.D.   On: 07/28/2020 15:59        Scheduled Meds: . alfuzosin  10 mg Oral Daily  . azelastine  1 spray Each Nare QHS  . Chlorhexidine Gluconate Cloth  6 each Topical Daily  . cycloSPORINE  1 drop Both Eyes BID  . dutasteride  0.5 mg Oral Daily  . enoxaparin  0.5 mg/kg Subcutaneous Q24H  . fluticasone  2 spray Each Nare Daily  . fluticasone furoate-vilanterol  1 puff Inhalation Daily  . insulin aspart  0-20 Units Subcutaneous TID WC  . insulin aspart  0-5 Units Subcutaneous QHS  . ipratropium  2 spray Each Nare BID  . [START ON 07/30/2020] linaclotide  145 mcg Per Tube Daily  . metoprolol tartrate  12.5 mg Per Tube BID  . montelukast  10 mg Per Tube QHS  . pregabalin  100 mg Per Tube QHS  . pregabalin  50 mg Per Tube TID with meals  . [START ON 07/30/2020] rosuvastatin  40 mg Per Tube Daily  . saccharomyces boulardii  250 mg Per Tube BID   Continuous Infusions: . cefTRIAXone (ROCEPHIN)  IV 2 g (07/29/20 0209)     LOS: 3 days    Time spent: 35 minutes    Florencia Reasons, MD PhD FACP [facility] Pager [phone]  If 7PM-7AM, please contact night-coverage  07/29/2020, 11:44 AM

## 2020-07-29 NOTE — Plan of Care (Signed)

## 2020-07-29 NOTE — Progress Notes (Signed)
   Subjective: 6 days status post Left total knee replacement Patient reports pain as mild to moderate  Patient was readmitted for urosepsis. NG tube placed last night for possible ileus Denies any CP, SOB, ABD pain. We will continue therapy today.    Objective: Vital signs in last 24 hours: Temp:  [97.8 F (36.6 C)-99.1 F (37.3 C)] 99.1 F (37.3 C) (04/28 0752) Pulse Rate:  [90-104] 90 (04/28 0752) Resp:  [16-20] 20 (04/28 0752) BP: (110-146)/(58-67) 146/65 (04/28 0752) SpO2:  [91 %-97 %] 91 % (04/28 0752)  Intake/Output from previous day: 04/27 0701 - 04/28 0700 In: 1658.5 [I.V.:1658.5] Out: 4875 [Urine:4875] Intake/Output this shift: No intake/output data recorded.  Recent Labs    07/26/20 1815 07/27/20 0753 07/28/20 0431 07/29/20 0533  HGB 9.8* 9.4* 9.3* 10.1*   Recent Labs    07/28/20 0431 07/29/20 0533  WBC 17.7* 15.4*  RBC 3.44* 3.77*  HCT 27.6* 30.2*  PLT 325 362   Recent Labs    07/28/20 0431 07/29/20 0533  NA 137 141  K 3.1* 3.6  CL 98 102  CO2 28 28  BUN 47* 36*  CREATININE 1.81* 1.61*  GLUCOSE 137* 127*  CALCIUM 8.2* 9.0   Recent Labs    07/26/20 1815 07/27/20 0358  INR 1.2 1.2    EXAM General - Patient is Alert, Appropriate and Oriented Extremity - Sensation intact distally Intact pulses distally Dorsiflexion/Plantar flexion intact No cellulitis present Compartment soft Dressing - dressing C/D/I, pre vena removed and honey comb dressing applied  Motor Function - intact, moving foot and toes well on exam.   Past Medical History:  Diagnosis Date  . Arthritis   . Basal cell carcinoma 01/30/2019   Left ear, superior helix. Nodular and infiltrative patterns. Simple excision/EDC.  . Basal cell carcinoma 11/03/2019   left lat brow  . COPD (chronic obstructive pulmonary disease) ([place])   . Diabetes mellitus without complication ([place])   . Dyspnea   . Hypertension   . Melanoma ([place]) 2001   Right forehead. Metastatic melanoma  2016  . Sleep apnea     Assessment/Plan:   3 Days Post-Op Procedure(s) (LRB): CYSTOSCOPY WITH STENT PLACEMENT (Bilateral) Principal Problem:   Sepsis associated hypotension (HCC) Active Problems:   S/P TKR (total knee replacement) using cement, left   Essential hypertension   AKI (acute kidney injury) ([place])   Hyperlipidemia   Ileus (HCC)  Estimated body mass index is 32.13 kg/m as calculated from the following:   Height as of this encounter: 6\' 2"  (1.88 m).   Weight as of this encounter: 113.5 kg. Up with therapy  Patient agreeable to polar care. Discussed applying polar care unit to LLE with nursing. Needs Bone Foam. NO bone foam in ortho floor supply closet. Will place order for bone foam D/C provena, honey comb applied LLE Follow up with [place] [facility]o 7 days for staple removal and steri strip application   DVT Prophylaxis - Lovenox, Foot Pumps and TED hose Weight-Bearing as tolerated to left leg   T. [name], PA-C [place] 07/29/2020, 9:00 AM

## 2020-07-29 NOTE — Progress Notes (Signed)
Physical Therapy Treatment Patient Details Name: Austin Sullivan MRN: [id number] DOB: 01/09/1956 Today's Date: 07/29/2020    History of Present Illness Pt is a 65 y.o. male with medical history significant for chronic pain, hyperlipidemia, hypertension, COPD, history of Hodgkin's lymphoma, stage IV melanoma with past chemotherapy for 5 months after 41 of 55 cycles, vocal cord paralysis, OSA, baseline exertional dyspnea, obesity, osteoarthritis status post left knee arthroplasty in 07/20/2020, and DM who presented to the emergency department for chief concerns of low blood pressure. MD assessment includes: severe sepsis secondary to obstructive ureteral stone now s/p stent placement and osteoarthritis of the left knee status post total knee arthroplasty 07/20/20.    PT Comments    Pt was sitting in recliner upon arriving. Agrees to session and is cooperative and motivated throughout. Was able to stand to RW and ambulate 200 ft without LOB or safety concern. Reviewed importance of polar care, bone foam, and reviewed there ex handout. Pt is doing well from a PT standpoint. Will continue to follow and progress pt per POC.     Follow Up Recommendations  Home health PT;Supervision for mobility/OOB     Equipment Recommendations  None recommended by PT       Precautions / Restrictions Precautions Precautions: Fall Restrictions Weight Bearing Restrictions: No LLE Weight Bearing: Weight bearing as tolerated    Mobility  Bed Mobility      General bed mobility comments: In recliner pre/post session    Transfers Overall transfer level: Needs assistance Equipment used: Rolling walker (2 wheeled) Transfers: Sit to/from Stand Sit to Stand: Supervision     Ambulation/Gait Ambulation/Gait assistance: Supervision Gait Distance (Feet): 200 Feet Assistive device: Rolling walker (2 wheeled) Gait Pattern/deviations: Antalgic;Trunk flexed;Step-through pattern;Decreased stance time - left Gait  velocity: WNL   General Gait Details: Pt was easily able to ambulate 200 ft with RW without LOB or safety concern. Poor gait posture throughout however tolerated well      Balance Overall balance assessment: Needs assistance Sitting-balance support: Feet supported Sitting balance-Leahy Scale: Good Sitting balance - Comments: no balance deficits in sitting   Standing balance support: Bilateral upper extremity supported;During functional activity Standing balance-Leahy Scale: Fair         Cognition Arousal/Alertness: Awake/alert Behavior During Therapy: WFL for tasks assessed/performed Overall Cognitive Status: Within Functional Limits for tasks assessed        General Comments: Pt is A and O x 4      Exercises Total Joint Exercises Ankle Circles/Pumps: AROM;Both;10 reps;15 reps Quad Sets: AROM;Left;Strengthening;10 reps;15 reps Heel Slides: AAROM;Left;10 reps Hip ABduction/ADduction: AROM Straight Leg Raises: AAROM;10 reps;Strengthening Goniometric ROM: 2-92        Pertinent Vitals/Pain Pain Assessment: No/denies pain Pain Score: 0-No pain           PT Goals (current goals can now be found in the care plan section) Acute Rehab PT Goals Patient Stated Goal: To get better and return home Progress towards PT goals: Progressing toward goals    Frequency    7X/week      PT Plan Current plan remains appropriate       AM-PAC PT "6 Clicks" Mobility   Outcome Measure  Help needed turning from your back to your side while in a flat bed without using bedrails?: A Little Help needed moving from lying on your back to sitting on the side of a flat bed without using bedrails?: A Little Help needed moving to and from a bed to a chair (including a  wheelchair)?: A Little Help needed standing up from a chair using your arms (e.g., wheelchair or bedside chair)?: A Little Help needed to walk in hospital room?: A Little Help needed climbing 3-5 steps with a railing? : A  Little 6 Click Score: 18    End of Session Equipment Utilized During Treatment: Gait belt Activity Tolerance: Patient tolerated treatment well Patient left: in chair;with call bell/phone within reach;with chair alarm set Nurse Communication: Mobility status PT Visit Diagnosis: Other abnormalities of gait and mobility (R26.89);Muscle weakness (generalized) (M62.81);Pain Pain - Right/Left: Left Pain - part of body: Knee     Time: 9811-9147 PT Time Calculation (min) (ACUTE ONLY): 23 min  Charges:  $Gait Training: 8-22 mins $Therapeutic Exercise: 8-22 mins                     Julaine Fusi PTA 07/29/20, 1:30 PM

## 2020-07-29 NOTE — Care Management Important Message (Signed)
Important Message  Patient Details  Name: Greene Diodato MRN: [id number] Date of Birth: [birth year]   Medicare Important Message Given:  Yes     Dannette Barbara 07/29/2020, 1:03 PM

## 2020-07-29 NOTE — Progress Notes (Signed)
Patient had an abdominal xray for distention and pain yesterday evening. Xray showed suggested ileus. Dr Myna Hidalgo was made aware of results. Patient was not having vomiting but a lot of gas, distention and pain. Decision was made to insert an NG tube to see if this would relieve some of patient's symptoms. Patient tolerated procedure well.

## 2020-07-30 LAB — URINALYSIS, ROUTINE W REFLEX MICROSCOPIC
Bilirubin Urine: NEGATIVE
Glucose, UA: NEGATIVE mg/dL
Ketones, ur: NEGATIVE mg/dL
Nitrite: NEGATIVE
Protein, ur: 100 mg/dL — AB
RBC / HPF: >50 RBC/hpf — ABNORMAL HIGH (ref 0–5)
Specific Gravity, Urine: 1.011 (ref 1.005–1.030)
pH: 7 (ref 5.0–8.0)

## 2020-07-30 LAB — BASIC METABOLIC PANEL
Anion gap: 10 (ref 5–15)
BUN: 39 mg/dL — ABNORMAL HIGH (ref 8–23)
CO2: 27 mmol/L (ref 22–32)
Calcium: 8.8 mg/dL — ABNORMAL LOW (ref 8.9–10.3)
Chloride: 103 mmol/L (ref 98–111)
Creatinine, Ser: 1.4 mg/dL — ABNORMAL HIGH (ref 0.61–1.24)
GFR, Estimated: 55 mL/min — ABNORMAL LOW (ref >60)
Glucose, Bld: 121 mg/dL — ABNORMAL HIGH (ref 70–99)
Potassium: 3.6 mmol/L (ref 3.5–5.1)
Sodium: 140 mmol/L (ref 135–145)

## 2020-07-30 LAB — HEPATIC FUNCTION PANEL
ALT: 48 U/L — ABNORMAL HIGH (ref 0–44)
AST: 36 U/L (ref 15–41)
Albumin: 2.5 g/dL — ABNORMAL LOW (ref 3.5–5.0)
Alkaline Phosphatase: 77 U/L (ref 38–126)
Bilirubin, Direct: 0.1 mg/dL (ref 0.0–0.2)
Indirect Bilirubin: 0.5 mg/dL (ref 0.3–0.9)
Total Bilirubin: 0.6 mg/dL (ref 0.3–1.2)
Total Protein: 6.1 g/dL — ABNORMAL LOW (ref 6.5–8.1)

## 2020-07-30 LAB — CBC WITH DIFFERENTIAL/PLATELET
Abs Immature Granulocytes: 0.27 10*3/uL — ABNORMAL HIGH (ref 0.00–0.07)
Basophils Absolute: 0.1 10*3/uL (ref 0.0–0.1)
Basophils Relative: 0 %
Eosinophils Absolute: 0.1 10*3/uL (ref 0.0–0.5)
Eosinophils Relative: 0 %
HCT: 32 % — ABNORMAL LOW (ref 39.0–52.0)
Hemoglobin: 10.8 g/dL — ABNORMAL LOW (ref 13.0–17.0)
Immature Granulocytes: 2 %
Lymphocytes Relative: 4 %
Lymphs Abs: 0.6 10*3/uL — ABNORMAL LOW (ref 0.7–4.0)
MCH: 27.2 pg (ref 26.0–34.0)
MCHC: 33.8 g/dL (ref 30.0–36.0)
MCV: 80.6 fL (ref 80.0–100.0)
Monocytes Absolute: 0.8 10*3/uL (ref 0.1–1.0)
Monocytes Relative: 5 %
Neutro Abs: 14.8 10*3/uL — ABNORMAL HIGH (ref 1.7–7.7)
Neutrophils Relative %: 89 %
Platelets: 386 10*3/uL (ref 150–400)
RBC: 3.97 MIL/uL — ABNORMAL LOW (ref 4.22–5.81)
RDW: 17.9 % — ABNORMAL HIGH (ref 11.5–15.5)
WBC: 16.6 10*3/uL — ABNORMAL HIGH (ref 4.0–10.5)
nRBC: 0 % (ref 0.0–0.2)

## 2020-07-30 LAB — GLUCOSE, CAPILLARY
Glucose-Capillary: 129 mg/dL — ABNORMAL HIGH (ref 70–99)
Glucose-Capillary: 109 mg/dL — ABNORMAL HIGH (ref 70–99)
Glucose-Capillary: 194 mg/dL — ABNORMAL HIGH (ref 70–99)
Glucose-Capillary: 142 mg/dL — ABNORMAL HIGH (ref 70–99)

## 2020-07-30 LAB — LIPASE, BLOOD: Lipase: 36 U/L (ref 11–51)

## 2020-07-30 LAB — LACTIC ACID, PLASMA: Lactic Acid, Venous: 0.9 mmol/L (ref 0.5–1.9)

## 2020-07-30 MED ORDER — VANCOMYCIN HCL 2000 MG/400ML IV SOLN
2000.0000 mg | Freq: Once | INTRAVENOUS | Status: AC
  Administered 2020-07-30: 2000 mg via INTRAVENOUS
  Filled 2020-07-30: qty 400

## 2020-07-30 MED ORDER — POLYETHYLENE GLYCOL 3350 17 G PO PACK
17.0000 g | PACK | Freq: Every day | ORAL | Status: DC
  Administered 2020-07-31: 17 g via ORAL
  Filled 2020-07-30: qty 1

## 2020-07-30 MED ORDER — SODIUM CHLORIDE 0.9 % IV SOLN
INTRAVENOUS | Status: DC | PRN
  Administered 2020-07-30: 1000 mL via INTRAVENOUS

## 2020-07-30 NOTE — Progress Notes (Signed)
PROGRESS NOTE    Austin Sullivan  NID:[id number] DOB: 05-May-1955 DOA: 07/26/2020 PCP: Derinda Late, MD   Brief Narrative:  65 y.o. male with medical history significant for chronic pain, hyperlipidemia, hypertension, COPD, history of Hodgkin's lymphoma, stage IV melanoma, previously on nivolumab with past chemotherapy for 5 months after 41 of 55 cycles, vocal cord paralysis, status post stent placed for approximation of cords, OSA, on his fifth device most recent is [place] device replaced with dream tomorrow, baseline exertional dyspnea, obesity, osteoarthritis status post left knee arthroplasty in 07/20/2020, non-insulin-dependent diabetes mellitus, presents to the emergency department for chief concerns of low blood pressure.  Patient states that during occupational therapy, the therapist reported that he had low blood pressure, high heart rate and low O2, thus prompting him to present to the emergency department for further evaluation.  Patient was admitted with working diagnosis of sepsis secondary to 1 cm left proximal ureteral stone with upstream hydronephrosis.  Taken emergently to the operating room by urology for placement of ureteral stent.  Tolerated procedure well.  Sepsis physiology improving afterwards.   Assessment & Plan:   Principal Problem:   Sepsis associated hypotension (HCC) Active Problems:   S/P TKR (total knee replacement) using cement, left   Essential hypertension   AKI (acute kidney injury) (HCC)   Hyperlipidemia   Ileus (HCC)  Severe sepsis secondary to obstructive ureteral stone - presents with Increase heart rate, low blood pressure, WBC elevated, lactic acid, organ involvement is renal - Status post Vanco and Zosyn per EDP - Patient has a rash/hives allergy with cefazolin, discussed with pharmacy,  pretreat with Benadryl and Solu-Medrol prior to hanging ceftriaxone --Emergent ureteral stent placement, --Good result, sepsis physiology  improving Plan: Continue Rocephin, urine culture  + ecoli sensitive to rocephin, will continue for now, transition to oral keflex once able to take oral meds reliably  ( need total of 14days abx)   Blood culture 1/4 bottle grew staph, will repeat blood culture, empirically start vanc Continue Foley catheter for at least the next 24 hours assuming he remains fever free Urology following, recommendations appreciated Follow-up outpatient ureteroscopy with laser lithotripsy and stent exchange in 2 to 3 weeks   Ileus/costiption /overflow diarrhea: NG placed on 4/27 pm, improved, ng fell out on 4/28 night, symptom appears improved, will advance diet as tolerated, decrease narcotics, increase activity  Hypokalemia, continue to replace K, mag wnl  Hyponatremia, present on admission Resolved after hydration   AKI on CKD 2 -Likely due to sepsis -Baseline creatinine 0.8 -Creatinine on presentation 2.64 -Creatinine improving 1.4 today -Renal dosing medication, repeat BMP in the morning  COPD resumed home inhalers -He received Solu-Medrol 40 mg IV x1 on 4/26 -There is no wheezing on exam , no cough , no short of breath ,not exacerbated likely will not need to continue steroids  Primary osteoarthritis of the left knee status post total knee arthroplasty - Knee arthroplasty was on 07/20/2020 - Wound VAC in place per orthopedic surgeon with no purulence in the wound VAC at this time - therefore I do not suspect this is a source of infection -- Weightbearing as tolerated -Left lower extremity edema, likely due to surgery, will check venous Doppler for completeness  Hyperlipidemia rosuvastatin 40 mg daily  Non-insulin-dependent diabetes mellitus holding metformin due to acute kidney injury at this time,  insulin sliding scale  insulin at bedtime coverage A.m. blood glucose 137 A1c 7.4 Change to carb modified diet  Neuropathy Started Lyrica per home dose  Hypertension holding  home ramipril 10 mg daily, verapamil 180 mg nightly, metoprolol tartrate 25 mg daily, chlorthalidone 25 mg daily due to hypotension -Vitals per unit protocol -Restart antihypertensive as appropriate, resume low dose lopressor today  # History of melanoma # OSA-CPAP nightly ordered # History of Hodgkin's lymphoma  Body mass index is 32.13 kg/m.    DVT prophylaxis: SQ Lovenox Code Status: Full Family Communication: wife at bedside on 4/28. Wife over the phone on 4/29 Disposition Plan: Status is: Inpatient  Remains inpatient appropriate because:Inpatient level of care appropriate due to severity of illness   Dispo: The patient is from: Home              Anticipated d/c is to: Home              Patient currently is not medically stable to d/c.   Difficult to place patient No   Leukocytosis, repeat blood culture potential discontinuation of Foley catheter in 24hrs      Level of care: Med-Surg  Consultants:   Urology  Ortho    Procedures:Ureteral stent, 4/25  Foley placement  Ng placement on 4/27  Antimicrobials:   Rocephin   vanc on 4/29   Subjective: Patient seen and examined.  He is laying in bed, reports no n/v, ab pain has resolved, passing gas  Foley in place with clear urine,     Objective: Vitals:   07/29/20 2315 07/30/20 0515 07/30/20 0753 07/30/20 1210  BP: 120/65 129/73 138/72 126/66  Pulse: 86 87 89 98  Resp: 18 20 18 19   Temp: 98.2 F (36.8 C) (!) 97.5 F (36.4 C) 98 F (36.7 C) 98.2 F (36.8 C)  TempSrc: Oral Oral  Oral  SpO2: 95% 96% 97% 98%  Weight:      Height:        Intake/Output Summary (Last 24 hours) at 07/30/2020 1849 Last data filed at 07/30/2020 1837 Gross per 24 hour  Intake 1871.51 ml  Output 2800 ml  Net -928.49 ml   Filed Weights   07/27/20 0225  Weight: 113.5 kg    Examination:  General exam: Appears calm and comfortable ,  foley in place Respiratory system: Clear to auscultation. Respiratory effort  normal. Cardiovascular system: S1 & S2 heard, RRR. No JVD, murmurs, rubs, gallops or clicks. No pedal edema. Gastrointestinal system: Improved abdominal exam, no distention, soft, nontender, positive bowel sounds Central nervous system: Alert and oriented. No focal neurological deficits. Extremities: left knee post op changes, left lower extremity pitting edema Skin: No rashes, lesions or ulcers Psychiatry: Judgement and insight appear normal. Mood & affect appropriate.     Data Reviewed: I have personally reviewed following labs and imaging studies  CBC: Recent Labs  Lab 07/26/20 1815 07/27/20 0753 07/28/20 0431 07/29/20 0533 07/30/20 0542  WBC 21.9* 20.7* 17.7* 15.4* 16.6*  NEUTROABS 20.2* 19.0* 16.3* 14.0* 14.8*  HGB 9.8* 9.4* 9.3* 10.1* 10.8*  HCT 29.5* 28.5* 27.6* 30.2* 32.0*  MCV 81.7 81.2 80.2 80.1 80.6  PLT 313 274 325 362 664   Basic Metabolic Panel: Recent Labs  Lab 07/26/20 1815 07/27/20 0753 07/28/20 0431 07/29/20 0533 07/30/20 0542  NA 131* 135 137 141 140  K 3.4* 3.6 3.1* 3.6 3.6  CL 91* 97* 98 102 103  CO2 25 27 28 28 27   GLUCOSE 197* 166* 137* 127* 121*  BUN 37* 40* 47* 36* 39*  CREATININE 2.64* 2.14* 1.81* 1.61* 1.40*  CALCIUM 8.6* 8.0* 8.2* 9.0 8.8*  MG  --   --   --  2.0  --    GFR: Estimated Creatinine Clearance: 69.5 mL/min (A) (by C-G formula based on SCr of 1.4 mg/dL (H)). Liver Function Tests: Recent Labs  Lab 07/26/20 1815 07/30/20 0542  AST 26 36  ALT 25 48*  ALKPHOS 63 77  BILITOT 0.9 0.6  PROT 6.5 6.1*  ALBUMIN 2.8* 2.5*   Recent Labs  Lab 07/30/20 0542  LIPASE 36   No results for input(s): AMMONIA in the last 168 hours. Coagulation Profile: Recent Labs  Lab 07/26/20 1815 07/27/20 0358  INR 1.2 1.2   Cardiac Enzymes: No results for input(s): CKTOTAL, CKMB, CKMBINDEX, TROPONINI in the last 168 hours. BNP (last 3 results) No results for input(s): PROBNP in the last 8760 hours. HbA1C: Recent Labs    07/29/20 0533   HGBA1C 7.4*   CBG: Recent Labs  Lab 07/29/20 1714 07/29/20 2141 07/30/20 0805 07/30/20 1209 07/30/20 1535  GLUCAP 133* 125* 129* 109* 194*   Lipid Profile: No results for input(s): CHOL, HDL, LDLCALC, TRIG, CHOLHDL, LDLDIRECT in the last 72 hours. Thyroid Function Tests: No results for input(s): TSH, T4TOTAL, FREET4, T3FREE, THYROIDAB in the last 72 hours. Anemia Panel: No results for input(s): VITAMINB12, FOLATE, FERRITIN, TIBC, IRON, RETICCTPCT in the last 72 hours. Sepsis Labs: Recent Labs  Lab 07/26/20 1815 07/26/20 2125 07/29/20 0533 07/30/20 0542  LATICACIDVEN 3.1* 2.4* 0.9 0.9    Recent Results (from the past 240 hour(s))  Resp Panel by RT-PCR (Flu A&B, Covid) Nasopharyngeal Swab     Status: None   Collection Time: 07/26/20  6:15 PM   Specimen: Nasopharyngeal Swab; Nasopharyngeal(NP) swabs in vial transport medium  Result Value Ref Range Status   SARS Coronavirus 2 by RT PCR NEGATIVE NEGATIVE Final    Comment: (NOTE) SARS-CoV-2 target nucleic acids are NOT DETECTED.  The SARS-CoV-2 RNA is generally detectable in upper respiratory specimens during the acute phase of infection. The lowest concentration of SARS-CoV-2 viral copies this assay can detect is 138 copies/mL. A negative result does not preclude SARS-Cov-2 infection and should not be used as the sole basis for treatment or other patient management decisions. A negative result may occur with  improper specimen collection/handling, submission of specimen other than nasopharyngeal swab, presence of viral mutation(s) within the areas targeted by this assay, and inadequate number of viral copies(<138 copies/mL). A negative result must be combined with clinical observations, patient history, and epidemiological information. The expected result is Negative.  Fact Sheet for Patients:  EntrepreneurPulse.com.au  Fact Sheet for Healthcare Providers:   IncredibleEmployment.be  This test is no t yet approved or cleared by the [place] FDA and  has been authorized for detection and/or diagnosis of SARS-CoV-2 by FDA under an Emergency Use Authorization (EUA). This EUA will remain  in effect (meaning this test can be used) for the duration of the COVID-19 declaration under Section 564(b)(1) of the Act, 21 U.S.C.section 360bbb-3(b)(1), unless the authorization is terminated  or revoked sooner.       Influenza A by PCR NEGATIVE NEGATIVE Final   Influenza B by PCR NEGATIVE NEGATIVE Final    Comment: (NOTE) The Xpert Xpress SARS-CoV-2/FLU/RSV plus assay is intended as an aid in the diagnosis of influenza from Nasopharyngeal swab specimens and should not be used as a sole basis for treatment. Nasal washings and aspirates are unacceptable for Xpert Xpress SARS-CoV-2/FLU/RSV testing.  Fact Sheet for Patients: EntrepreneurPulse.com.au  Fact Sheet for Healthcare Providers: IncredibleEmployment.be  This test is not yet approved or cleared by the [place]  States FDA and has been authorized for detection and/or diagnosis of SARS-CoV-2 by FDA under an Emergency Use Authorization (EUA). This EUA will remain in effect (meaning this test can be used) for the duration of the COVID-19 declaration under Section 564(b)(1) of the Act, 21 U.S.C. section 360bbb-3(b)(1), unless the authorization is terminated or revoked.  Performed at [facility], [address] [place], [place] [address]   Blood Culture (routine x 2)     Status: Abnormal (Preliminary result)   Collection Time: 07/26/20  6:15 PM   Specimen: BLOOD  Result Value Ref Range Status   Specimen Description   Final    BLOOD RIGHT ANTECUBITAL Performed at [facility], [address] [place], [place] [address]    Special Requests   Final    BOTTLES DRAWN AEROBIC AND ANAEROBIC Blood Culture adequate  volume Performed at [facility], [address], [place], [place] [address]    Culture  Setup Time   Final    ANAEROBIC BOTTLE ONLY GRAM POSITIVE COCCI Organism ID to follow CRITICAL RESULT CALLED TO, READ BACK BY AND VERIFIED WITH: SAMANTHA RAUER @2018  07/28/20 MJU Performed at [facility], [address] [place], [place] [address]    Culture (A)  Final    STAPHYLOCOCCUS CAPITIS SUSCEPTIBILITIES TO FOLLOW Performed at [facility], [place] [address], [place], [place] [address]    Report Status PENDING  Incomplete  Blood Culture (routine x 2)     Status: None (Preliminary result)   Collection Time: 07/26/20  6:15 PM   Specimen: BLOOD  Result Value Ref Range Status   Specimen Description BLOOD LEFT ANTECUBITAL  Final   Special Requests   Final    BOTTLES DRAWN AEROBIC AND ANAEROBIC Blood Culture results may not be optimal due to an inadequate volume of blood received in culture bottles   Culture   Final    NO GROWTH 4 DAYS Performed at [facility], [address], [place], [place] [address]    Report Status PENDING  Incomplete  Urine culture     Status: Abnormal   Collection Time: 07/26/20  6:15 PM   Specimen: Urine, Random  Result Value Ref Range Status   Specimen Description   Final    URINE, RANDOM Performed at [facility], [address] [place], [place] [address]    Special Requests   Final    NONE Performed at [facility], [address] [place], [place] [address]    Culture >=100,000 COLONIES/mL ESCHERICHIA COLI (A)  Final   Report Status 07/29/2020 FINAL  Final   Organism ID, Bacteria ESCHERICHIA COLI (A)  Final      Susceptibility   Escherichia coli - MIC*    AMPICILLIN >=32 RESISTANT Resistant     CEFAZOLIN <=4 SENSITIVE Sensitive     CEFEPIME <=0.12 SENSITIVE Sensitive     CEFTRIAXONE <=0.25 SENSITIVE Sensitive     CIPROFLOXACIN 0.5 SENSITIVE Sensitive     GENTAMICIN <=1 SENSITIVE Sensitive      IMIPENEM <=0.25 SENSITIVE Sensitive     NITROFURANTOIN <=16 SENSITIVE Sensitive     TRIMETH/SULFA <=20 SENSITIVE Sensitive     AMPICILLIN/SULBACTAM >=32 RESISTANT Resistant     PIP/TAZO <=4 SENSITIVE Sensitive     * >=100,000 COLONIES/mL ESCHERICHIA COLI  Blood Culture ID Panel (Reflexed)     Status: Abnormal   Collection Time: 07/26/20  6:15 PM  Result Value Ref Range Status   Enterococcus faecalis NOT DETECTED NOT  DETECTED Final   Enterococcus Faecium NOT DETECTED NOT DETECTED Final   Listeria monocytogenes NOT DETECTED NOT DETECTED Final   Staphylococcus species DETECTED (A) NOT DETECTED Final    Comment: CRITICAL RESULT CALLED TO, READ BACK BY AND VERIFIED WITH: SAMANTHA RAUER @2018  07/28/20 MJU    Staphylococcus aureus (BCID) NOT DETECTED NOT DETECTED Final   Staphylococcus epidermidis NOT DETECTED NOT DETECTED Final   Staphylococcus lugdunensis NOT DETECTED NOT DETECTED Final   Streptococcus species NOT DETECTED NOT DETECTED Final   Streptococcus agalactiae NOT DETECTED NOT DETECTED Final   Streptococcus pneumoniae NOT DETECTED NOT DETECTED Final   Streptococcus pyogenes NOT DETECTED NOT DETECTED Final   A.calcoaceticus-baumannii NOT DETECTED NOT DETECTED Final   Bacteroides fragilis NOT DETECTED NOT DETECTED Final   Enterobacterales NOT DETECTED NOT DETECTED Final   Enterobacter cloacae complex NOT DETECTED NOT DETECTED Final   Escherichia coli NOT DETECTED NOT DETECTED Final   Klebsiella aerogenes NOT DETECTED NOT DETECTED Final   Klebsiella oxytoca NOT DETECTED NOT DETECTED Final   Klebsiella pneumoniae NOT DETECTED NOT DETECTED Final   Proteus species NOT DETECTED NOT DETECTED Final   Salmonella species NOT DETECTED NOT DETECTED Final   Serratia marcescens NOT DETECTED NOT DETECTED Final   Haemophilus influenzae NOT DETECTED NOT DETECTED Final   Neisseria meningitidis NOT DETECTED NOT DETECTED Final   Pseudomonas aeruginosa NOT DETECTED NOT DETECTED Final    Stenotrophomonas maltophilia NOT DETECTED NOT DETECTED Final   Candida albicans NOT DETECTED NOT DETECTED Final   Candida auris NOT DETECTED NOT DETECTED Final   Candida glabrata NOT DETECTED NOT DETECTED Final   Candida krusei NOT DETECTED NOT DETECTED Final   Candida parapsilosis NOT DETECTED NOT DETECTED Final   Candida tropicalis NOT DETECTED NOT DETECTED Final   Cryptococcus neoformans/gattii NOT DETECTED NOT DETECTED Final    Comment: Performed at [facility], [address] [place], [place] [address]  C Difficile Quick Screen w PCR reflex     Status: None   Collection Time: 07/28/20 10:15 PM   Specimen: STOOL  Result Value Ref Range Status   C Diff antigen NEGATIVE NEGATIVE Final   C Diff toxin NEGATIVE NEGATIVE Final   C Diff interpretation No C. difficile detected.  Final    Comment: Performed at [facility], [address] [place], [place] [address]         Radiology Studies: CT ABDOMEN PELVIS WO CONTRAST  Result Date: 07/29/2020 CLINICAL DATA:  Acute nonlocalized abdominal pain and distention. EXAM: CT ABDOMEN AND PELVIS WITHOUT CONTRAST TECHNIQUE: Multidetector CT imaging of the abdomen and pelvis was performed following the standard protocol without IV contrast. COMPARISON:  07/26/2020 FINDINGS: Lower chest: Focal atelectasis and scarring in the medial aspect of the right lower lung posteriorly. This is unchanged since the prior study. Calcified lymph nodes in the subcarinal region. Mild pericardial thickening. Prominent coronary artery calcifications. Central venous catheter tip at the cavoatrial junction. Hepatobiliary: No focal liver abnormality is seen. No gallstones, gallbladder wall thickening, or biliary dilatation. Pancreas: Unremarkable. No pancreatic ductal dilatation or surrounding inflammatory changes. Spleen: Normal in size without focal abnormality. Adrenals/Urinary Tract: No adrenal gland nodules. Bilateral ureteral stents. Bladder is  decompressed with a Foley catheter. Multiple bilateral intrarenal stones are demonstrated. Stranding around the kidneys, greatest on the left. Prominent stranding and infiltration around the ureters and in the pelvis, increasing since prior study. This could represent infectious or inflammatory change or possibly urine extravasation. Retroperitoneal fibrosis would be unlikely to progress as  much in this short of the time. Stomach/Bowel: Stomach, small bowel, and colon are not abnormally distended. A few scattered colonic diverticular present without evidence of diverticulitis. Appendix is normal. Vascular/Lymphatic: Aortic atherosclerosis. No enlarged abdominal or pelvic lymph nodes. Reproductive: Prostate is unremarkable. Other: No free air in the abdomen. Abdominal wall musculature appears intact. Musculoskeletal: Left hip arthroplasty. Degenerative changes in the spine. No destructive bone lesions. IMPRESSION: 1. Bilateral ureteral stents and Foley catheter in the bladder. Multiple bilateral intrarenal stones. 2. Prominent stranding and infiltration around the ureters and in the pelvis, increasing since prior study. This could represent infectious or inflammatory change or possibly urine extravasation. Retroperitoneal fibrosis would be unlikely to progress as much in this short of the time. 3. Focal atelectasis and scarring in the medial right lower lung is unchanged since the prior study. 4. Prominent coronary artery calcifications. 5. Aortic atherosclerosis. Aortic Atherosclerosis (ICD10-I70.0). Electronically Signed   By: Lucienne Capers M.D.   On: 07/29/2020 19:41   DG Abd 1 View  Result Date: 07/29/2020 CLINICAL DATA:  Nasogastric tube placement EXAM: ABDOMEN - 1 VIEW COMPARISON:  07/28/2020 FINDINGS: Nasogastric tube tip seen within the proximal to mid body of the stomach. The visualized abdominal gas pattern is unremarkable. Pelvis excluded from view. Bilateral ureteral stents are partially visualized  overlying the expected renal pelves bilaterally. IMPRESSION: Nasogastric tube tip within the proximal to mid body of the stomach. Electronically Signed   By: Fidela Salisbury MD   On: 07/29/2020 01:28   DG Abd 1 View  Result Date: 07/28/2020 CLINICAL DATA:  Abdominal distension.  Constipation. EXAM: ABDOMEN - 1 VIEW COMPARISON:  Abdominal CT 2 days ago. FINDINGS: Bilateral ureteral stents in place. Renal calculi on prior CT are not well visualized on the current exam. Increased air throughout small and large bowel. Small volume of formed stool in the colon. Left hip arthroplasty. IMPRESSION: Increased air throughout small and large bowel suggesting ileus. Bilateral ureteral stents in place. Electronically Signed   By: Keith Rake M.D.   On: 07/28/2020 20:46        Scheduled Meds: . alfuzosin  10 mg Oral Daily  . azelastine  1 spray Each Nare QHS  . Chlorhexidine Gluconate Cloth  6 each Topical Daily  . cycloSPORINE  1 drop Both Eyes BID  . dutasteride  0.5 mg Oral Daily  . enoxaparin  0.5 mg/kg Subcutaneous Q24H  . fluticasone  2 spray Each Nare Daily  . fluticasone furoate-vilanterol  1 puff Inhalation Daily  . insulin aspart  0-20 Units Subcutaneous TID WC  . insulin aspart  0-5 Units Subcutaneous QHS  . ipratropium  2 spray Each Nare BID  . linaclotide  145 mcg Oral Daily  . metoprolol tartrate  12.5 mg Oral BID  . montelukast  10 mg Oral QHS  . [START ON 07/31/2020] polyethylene glycol  17 g Oral Daily  . pregabalin  100 mg Oral QHS  . pregabalin  50 mg Oral TID with meals  . rosuvastatin  40 mg Oral Daily  . saccharomyces boulardii  250 mg Oral BID   Continuous Infusions: . cefTRIAXone (ROCEPHIN)  IV Stopped (07/30/20 0100)     LOS: 4 days    Time spent: 35 minutes    Florencia Reasons, MD PhD FACP [facility] Pager [phone]  If 7PM-7AM, please contact night-coverage  07/30/2020, 6:49 PM

## 2020-07-30 NOTE — Plan of Care (Signed)

## 2020-07-30 NOTE — Progress Notes (Signed)
Physical Therapy Treatment Patient Details Name: Austin Sullivan MRN: [id number] DOB: [birth year] Today's Date: 07/30/2020    History of Present Illness Pt is a 65 y.o. male with medical history significant for chronic pain, hyperlipidemia, hypertension, COPD, history of Hodgkin's lymphoma, stage IV melanoma with past chemotherapy for 5 months after 41 of 55 cycles, vocal cord paralysis, OSA, baseline exertional dyspnea, obesity, osteoarthritis status post left knee arthroplasty in 07/20/2020, and DM who presented to the emergency department for chief concerns of low blood pressure. MD assessment includes: severe sepsis secondary to obstructive ureteral stone now s/p stent placement and osteoarthritis of the left knee status post total knee arthroplasty 07/20/20.    PT Comments    Pt was supine in bed with HOB elevated ~ 20 degrees upon arriving. He agrees to session and is motivated and cooperative throughout. Easily able to exit bed, stand, and ambulate with use of RW. Pt is doing well overall form PT standpoint. Achieved 92 degrees knee flexion after performing stretching activity. He still lacks several degrees extension. Reviewed positioning and importance of full terminal knee extension. States understanding. Spouse arrived at conclusion of session. Recommend HHPT at DC.     Follow Up Recommendations  Home health PT;Supervision for mobility/OOB     Equipment Recommendations  None recommended by PT    Recommendations for Other Services       Precautions / Restrictions Precautions Precautions: Fall Restrictions Weight Bearing Restrictions: No LLE Weight Bearing: Weight bearing as tolerated    Mobility  Bed Mobility Overal bed mobility: Modified Independent      General bed mobility comments: HOB slightly elevated    Transfers Overall transfer level: Needs assistance Equipment used: Rolling walker (2 wheeled) Transfers: Sit to/from Stand Sit to Stand: Supervision          General transfer comment: Pt was able to stand and sit without physical assistance  Ambulation/Gait Ambulation/Gait assistance: Supervision Gait Distance (Feet): 200 Feet Assistive device: Rolling walker (2 wheeled) Gait Pattern/deviations: Antalgic;Trunk flexed;Step-through pattern;Decreased stance time - left Gait velocity: WNL   General Gait Details: Pt was easily able to ambulate 200 ft with RW without LOB or safety concern. Poor gait posture throughout however tolerated well       Balance Overall balance assessment: Needs assistance Sitting-balance support: Feet supported Sitting balance-Leahy Scale: Good Sitting balance - Comments: no balance deficits in sitting   Standing balance support: Bilateral upper extremity supported;During functional activity Standing balance-Leahy Scale: Good Standing balance comment: reliant on BUE support duuring dynamic activity however no LOB or safety concerns       Cognition Arousal/Alertness: Awake/alert Behavior During Therapy: WFL for tasks assessed/performed Overall Cognitive Status: Within Functional Limits for tasks assessed      General Comments: Pt is A and O x 4      Exercises Total Joint Exercises Goniometric ROM: 92 degrees flexion, lacks 2 degrees extension.    General Comments General comments (skin integrity, edema, etc.): reviewed and pt I'ly performed HEP handout      Pertinent Vitals/Pain Pain Assessment: No/denies pain Pain Score: 0-No pain           PT Goals (current goals can now be found in the care plan section) Acute Rehab PT Goals Patient Stated Goal: To get better and return home Progress towards PT goals: Progressing toward goals    Frequency    7X/week      PT Plan Current plan remains appropriate       AM-PAC PT "6  Clicks" Mobility   Outcome Measure  Help needed turning from your back to your side while in a flat bed without using bedrails?: None Help needed moving from lying on  your back to sitting on the side of a flat bed without using bedrails?: None Help needed moving to and from a bed to a chair (including a wheelchair)?: None Help needed standing up from a chair using your arms (e.g., wheelchair or bedside chair)?: A Little Help needed to walk in hospital room?: A Little Help needed climbing 3-5 steps with a railing? : A Little 6 Click Score: 21    End of Session Equipment Utilized During Treatment: Gait belt Activity Tolerance: Patient tolerated treatment well Patient left: in chair;with call bell/phone within reach;with chair alarm set Nurse Communication: Mobility status PT Visit Diagnosis: Other abnormalities of gait and mobility (R26.89);Muscle weakness (generalized) (M62.81);Pain Pain - Right/Left: Left Pain - part of body: Knee     Time: 1131-1157 PT Time Calculation (min) (ACUTE ONLY): 26 min  Charges:  $Gait Training: 8-22 mins $Therapeutic Exercise: 8-22 mins                     Julaine Fusi PTA 07/30/20, 1:33 PM

## 2020-07-30 NOTE — Progress Notes (Signed)
Patient stated that his NG tube came out when he was on the bed side commode tonight around 6pm. I asked if he wanted back in and he said no to leave it out.

## 2020-07-31 LAB — CBC WITH DIFFERENTIAL/PLATELET
Abs Immature Granulocytes: 0.32 10*3/uL — ABNORMAL HIGH (ref 0.00–0.07)
Basophils Absolute: 0.1 10*3/uL (ref 0.0–0.1)
Basophils Relative: 0 %
Eosinophils Absolute: 0.1 10*3/uL (ref 0.0–0.5)
Eosinophils Relative: 1 %
HCT: 31.4 % — ABNORMAL LOW (ref 39.0–52.0)
Hemoglobin: 10.6 g/dL — ABNORMAL LOW (ref 13.0–17.0)
Immature Granulocytes: 2 %
Lymphocytes Relative: 6 %
Lymphs Abs: 0.8 10*3/uL (ref 0.7–4.0)
MCH: 27 pg (ref 26.0–34.0)
MCHC: 33.8 g/dL (ref 30.0–36.0)
MCV: 80.1 fL (ref 80.0–100.0)
Monocytes Absolute: 0.7 10*3/uL (ref 0.1–1.0)
Monocytes Relative: 5 %
Neutro Abs: 12.1 10*3/uL — ABNORMAL HIGH (ref 1.7–7.7)
Neutrophils Relative %: 86 %
Platelets: 412 10*3/uL — ABNORMAL HIGH (ref 150–400)
RBC: 3.92 MIL/uL — ABNORMAL LOW (ref 4.22–5.81)
RDW: 18 % — ABNORMAL HIGH (ref 11.5–15.5)
WBC: 14.1 10*3/uL — ABNORMAL HIGH (ref 4.0–10.5)
nRBC: 0 % (ref 0.0–0.2)

## 2020-07-31 LAB — CULTURE, BLOOD (ROUTINE X 2): Culture: NO GROWTH

## 2020-07-31 LAB — BASIC METABOLIC PANEL
Anion gap: 9 (ref 5–15)
BUN: 39 mg/dL — ABNORMAL HIGH (ref 8–23)
CO2: 27 mmol/L (ref 22–32)
Calcium: 8.7 mg/dL — ABNORMAL LOW (ref 8.9–10.3)
Chloride: 103 mmol/L (ref 98–111)
Creatinine, Ser: 1.33 mg/dL — ABNORMAL HIGH (ref 0.61–1.24)
GFR, Estimated: 59 mL/min — ABNORMAL LOW (ref >60)
Glucose, Bld: 130 mg/dL — ABNORMAL HIGH (ref 70–99)
Potassium: 3.2 mmol/L — ABNORMAL LOW (ref 3.5–5.1)
Sodium: 139 mmol/L (ref 135–145)

## 2020-07-31 LAB — GLUCOSE, CAPILLARY
Glucose-Capillary: 122 mg/dL — ABNORMAL HIGH (ref 70–99)
Glucose-Capillary: 220 mg/dL — ABNORMAL HIGH (ref 70–99)

## 2020-07-31 LAB — URINE CULTURE: Culture: NO GROWTH

## 2020-07-31 LAB — MAGNESIUM: Magnesium: 1.9 mg/dL (ref 1.7–2.4)

## 2020-07-31 MED ORDER — METOPROLOL TARTRATE 25 MG PO TABS: 12.5000 mg | ORAL_TABLET | Freq: Two times a day (BID) | ORAL | 0 refills | Status: DC

## 2020-07-31 MED ORDER — SACCHAROMYCES BOULARDII 250 MG PO CAPS: 250.0000 mg | ORAL_CAPSULE | Freq: Two times a day (BID) | ORAL | 0 refills | Status: AC

## 2020-07-31 MED ORDER — RAMIPRIL 10 MG PO CAPS: 20.0000 mg | ORAL_CAPSULE | Freq: Every day | ORAL | Status: DC

## 2020-07-31 MED ORDER — CHLORTHALIDONE 25 MG PO TABS: 25.0000 mg | ORAL_TABLET | Freq: Every day | ORAL | Status: DC

## 2020-07-31 MED ORDER — POTASSIUM CHLORIDE CRYS ER 20 MEQ PO TBCR
40.0000 meq | EXTENDED_RELEASE_TABLET | ORAL | Status: AC
  Administered 2020-07-31 (×2): 40 meq via ORAL
  Filled 2020-07-31 (×2): qty 2

## 2020-07-31 MED ORDER — SULFAMETHOXAZOLE-TRIMETHOPRIM 800-160 MG PO TABS: 1.0000 | ORAL_TABLET | Freq: Two times a day (BID) | ORAL | 0 refills | Status: AC

## 2020-07-31 NOTE — Plan of Care (Signed)
VSS, Patient up and ambulating in hallway, foley catheter removed, patient voiding post removal.

## 2020-07-31 NOTE — Discharge Summary (Signed)
Discharge Summary  Austin Sullivan TGG:[id number] DOB: [birth year]  PCP: Derinda Late, MD  Admit date: 07/26/2020 Discharge date: 07/31/2020  Time spent: 32mins, more than 50% time spent on coordination of care.   Recommendations for Outpatient Follow-up:  1. F/u with PCP within a week  for hospital discharge follow up, repeat cbc/bmp at follow up, patient is advised to blood pressure twice a day for the next two weeks, bring in record for pcp to review,  pcp to gradually restart back his blood pressure meds.  2. Follow-up with urology Dr Diamantina Providence in 2 weeks 3. Follow-up with orthopedics Dr Rudene Christians on 5/6 4. Home health arranged    Discharge Diagnoses:  Active Hospital Problems   Diagnosis Date Noted  . Sepsis associated hypotension ([place]) 07/26/2020  . Ileus ([place])   . Essential hypertension 07/26/2020  . AKI (acute kidney injury) ([place]) 07/26/2020  . Hyperlipidemia 07/26/2020  . S/P TKR (total knee replacement) using cement, left 07/20/2020    Resolved Hospital Problems  No resolved problems to display.    Discharge Condition: stable  Diet recommendation: heart healthy/carb modified  Filed Weights   07/27/20 0225  Weight: 113.5 kg    History of present illness: (Per admitting provider Dr. Tobie Poet) Chief Concern: Abnormal vitals  HPI: Austin Sullivan is a 65 y.o. male with medical history significant for chronic pain, hyperlipidemia, hypertension, COPD, history of Hodgkin's lymphoma, stage IV melanoma, previously on nivolumab with past chemotherapy for 5 months after 41 of 55 cycles, vocal cord paralysis, status post stent placed for approximation of cords, OSA, on his fifth device most recent is [place] device replaced with dream tomorrow, baseline exertional dyspnea, obesity, osteoarthritis status post left knee arthroplasty in 07/20/2020, non-insulin-dependent diabetes mellitus, presents to the emergency department for chief concerns of low blood pressure.  Patient states that  during occupational therapy, the therapist reported that he had low blood pressure, high heart rate and low O2, thus prompting him to present to the emergency department for further evaluation.  At home he denies fever, vomiting, chest pain, dysuria, hematuria, headache, syncope, loss of consciousness. His wife at bedside endorses that he has nausea since arthroplasty  Hospital Course:  Principal Problem:   Sepsis associated hypotension ([facility]) Active Problems:   S/P TKR (total knee replacement) using cement, left   Essential hypertension   AKI (acute kidney injury) ([facility])   Hyperlipidemia   Ileus ([facility])   Severe sepsis secondary to obstructive ureteral stone -presents with Increase heart rate, low blood pressure, WBC elevated, lactic acid, organ involvement is renal -Status post Vanco and Zosyn per EDP ---s/p Emergent ureteral stent placement on 4/25, by Dr. Diamantina Providence -urine culture  + ecoli sensitive to rocephin, Patient has a rash/hives allergy with cefazolin, discussed with pharmacy,  pretreat with Benadryl and Solu-Medrolprior to hanging ceftriaxone, he tolerated Rocephin in the hospital, discharged on Bactrim to finish total 14 days antibiotic treatment -Blood culture 1/4 bottle grew staph, likely contamination ,repeat blood culture no growth, empirically  vancx1 on 4/28 -Foley catheter removed on 4/30, he improved, desire to go home -Follow-up outpatient ureteroscopy with laser lithotripsy and stent exchange in 2 to 3 weeks   Ileus/costiption /overflow diarrhea: NG placed on 4/27 pm, ng fell out on 4/28 night, decrease narcotics, increase activity, symptom has improved, he tolerated diet advancement    Hypokalemia,  replaced and improved , mag wnl  Hyponatremia, present on admission Resolved after hydration   AKI on CKD 2 -Likely due to sepsis -Baseline creatinine 0.8 -Creatinine  on presentation 2.64 -Creatinine improving 1.33 today -Renal dosing  medication -Follow-up with PCP  COPD -He received Solu-Medrol 40 mg IV x1 on 4/26 -There is no wheezing on exam , no cough , no short of breath ,not exacerbated likely will not need to continue steroids -Continue home meds  Primary osteoarthritis of the left knee status post total kneearthroplasty -Knee arthroplasty was on 07/20/2020 -Wound VAC in place per orthopedic surgeon with no purulence in the wound VAC at this time, wound VAC removed -Left lower extremity edema likely postop changes, venous Doppler no DVT, edema has improved at discharge --- Weightbearing as tolerated   Hyperlipidemia rosuvastatin 40 mg daily  Non-insulin-dependent diabetes mellitus A1c 7.4% He received SSI in the hospital  metformin held in the hospital due to acute kidney injury , renal function improved, metformin resumed at discharge   carb modified diet Follow-up with PCP  Neuropathy Started Lyrica per home dose  Hypertension  home meds ramipril 10 mg daily, verapamil 180 mg nightly, metoprolol tartrate 25 mg daily, chlorthalidone 25 mg daily held in the hospital  due to hypotension -Recommend discontinue verapamil as patient has severe constipation - discharge on Toprol tartrate 12.5 mg twice a day, - resume chlorthalidone in a week, PCP to monitor renal function and potassium level -PCP to decide on when to resume ramipril -Patient is advised to check blood pressure at home twice a day, bring blood pressure record to PCP for further blood pressure medication adjustment  #OSA-CPAP nightly, Body mass index is 32.13 kg/m. #History of melanoma #History of Hodgkin's lymphoma      DVT prophylaxis while in the hospital: SQ Lovenox Code Status: Full Family Communication: wife at bedside on 4/28. Wife over the phone on 4/29, wife at bedside on 4/30 Disposition Plan:  Home with home health Consultants:   Urology  Ortho    Procedures:Ureteral stent, 4/25  Foley  placement  Ng placement on 4/27  Antimicrobials:   Rocephin   vanc on 4/29   Discharge Exam: BP (!) 108/58 (BP Location: Right Arm)   Pulse 94   Temp 98.7 F (37.1 C) (Oral)   Resp 18   Ht 6\' 2"  (1.88 m)   Wt 113.5 kg   SpO2 97%   BMI 32.13 kg/m   General: NAD Cardiovascular: RRR Respiratory: Normal respiratory effort  Discharge Instructions You were cared for by a hospitalist during your hospital stay. If you have any questions about your discharge medications or the care you received while you were in the hospital after you are discharged, you can call the unit and asked to speak with the hospitalist on call if the hospitalist that took care of you is not available. Once you are discharged, your primary care physician will handle any further medical issues. Please note that NO REFILLS for any discharge medications will be authorized once you are discharged, as it is imperative that you return to your primary care physician (or establish a relationship with a primary care physician if you do not have one) for your aftercare needs so that they can reassess your need for medications and monitor your lab values.  Discharge Instructions    Diet - low sodium heart healthy   Complete by: As directed    Carb modified diet   Discharge wound care:   Complete by: As directed    Per orthopedic instruction   Increase activity slowly   Complete by: As directed      Allergies as of 07/31/2020  Reactions   Bee Venom Swelling   Cefazolin Hives, Rash      Medication List    STOP taking these medications   verapamil 180 MG 24 hr capsule Commonly known as: VERELAN PM     TAKE these medications   alfuzosin 10 MG 24 hr tablet Commonly known as: UROXATRAL Take 10 mg by mouth daily.   Azelastine HCl 137 MCG/SPRAY Soln Place 1 spray into the nose in the morning and at bedtime.   Breo Ellipta 100-25 MCG/INH Aepb Generic drug: fluticasone furoate-vilanterol Inhale 1 puff  into the lungs daily.   chlorthalidone 25 MG tablet Commonly known as: HYGROTON Take 1 tablet (25 mg total) by mouth daily. Resume in one week, follow up with pcp to monitor kidney function and potassium level. What changed: additional instructions   cycloSPORINE 0.05 % ophthalmic emulsion Commonly known as: RESTASIS Place 1 drop into both eyes 2 (two) times daily.   docusate sodium 100 MG capsule Commonly known as: COLACE Take 1 capsule (100 mg total) by mouth 2 (two) times daily.   dutasteride 0.5 MG capsule Commonly known as: AVODART Take 0.5 mg by mouth daily.   enoxaparin 40 MG/0.4ML injection Commonly known as: Lovenox Inject 0.4 mLs (40 mg total) into the skin daily for 14 days.   fluticasone 50 MCG/ACT nasal spray Commonly known as: FLONASE Place 2 sprays into both nostrils daily.   ipratropium 0.06 % nasal spray Commonly known as: ATROVENT Place 2 sprays into both nostrils 2 (two) times daily.   ipratropium-albuterol 0.5-2.5 (3) MG/3ML Soln Commonly known as: DUONEB Inhale 3 mLs into the lungs 2 (two) times daily as needed for shortness of breath.   lidocaine-prilocaine cream Commonly known as: EMLA Apply 1 application topically daily as needed (port access).   Linzess 145 MCG Caps capsule Generic drug: linaclotide Take 145 mcg by mouth daily.   metFORMIN 1000 MG tablet Commonly known as: GLUCOPHAGE Take 1,000 mg by mouth 2 (two) times daily.   metoprolol tartrate 25 MG tablet Commonly known as: LOPRESSOR Take 0.5 tablets (12.5 mg total) by mouth 2 (two) times daily. What changed:   how much to take  when to take this   montelukast 10 MG tablet Commonly known as: SINGULAIR Take 10 mg by mouth at bedtime.   oxyCODONE 5 MG immediate release tablet Commonly known as: Oxy IR/ROXICODONE Take 1-2 tablets (5-10 mg total) by mouth every 4 (four) hours as needed for moderate pain (pain score 4-6).   pregabalin 50 MG capsule Commonly known as:  LYRICA Take 50 mg by mouth See admin instructions. 50 mg with each meal, 100 mg at bedtime   ramipril 10 MG capsule Commonly known as: ALTACE Take 2 capsules (20 mg total) by mouth daily. Hold for now, resume when ok with your pcp. What changed: additional instructions   rosuvastatin 40 MG tablet Commonly known as: CRESTOR Take 40 mg by mouth at bedtime.   saccharomyces boulardii 250 MG capsule Commonly known as: FLORASTOR Take 1 capsule (250 mg total) by mouth 2 (two) times daily for 14 days.   sulfamethoxazole-trimethoprim 800-160 MG tablet Commonly known as: BACTRIM DS Take 1 tablet by mouth 2 (two) times daily for 9 days.   traMADol 50 MG tablet Commonly known as: ULTRAM Take 1 tablet (50 mg total) by mouth every 6 (six) hours as needed.            Discharge Care Instructions  (From admission, onward)  Start     Ordered   07/31/20 0000  Discharge wound care:       Comments: Per orthopedic instruction   07/31/20 1002         Allergies  Allergen Reactions  . Bee Venom Swelling  . Cefazolin Hives and Rash    Follow-up Information    Derinda Late, MD Follow up in 1 week(s).   Specialty: Family Medicine Why: hospital discharge follow up, pcp to repeat basic labs including cbc/bmp.  please check your blood pressure twice a day for the next two weeks, bring in record for your doctor to review, your pcp to gradually restart back your blood pressure meds.  Contact information: [address] [facility] and [facility] Medicine [place] [address] [phone]        Hessie Knows, MD Follow up on 08/06/2020.   Specialty: Orthopedic Surgery Why: left knee post op follow up Contact information: [place] [address] [phone]        Billey Co, MD Follow up in 2 week(s).   Specialty: Urology Contact information: [place]  [address] [phone]                The results of significant diagnostics from this hospitalization (including imaging, microbiology, ancillary and laboratory) are listed below for reference.    Significant Diagnostic Studies: CT ABDOMEN PELVIS WO CONTRAST  Result Date: 07/29/2020 CLINICAL DATA:  Acute nonlocalized abdominal pain and distention. EXAM: CT ABDOMEN AND PELVIS WITHOUT CONTRAST TECHNIQUE: Multidetector CT imaging of the abdomen and pelvis was performed following the standard protocol without IV contrast. COMPARISON:  07/26/2020 FINDINGS: Lower chest: Focal atelectasis and scarring in the medial aspect of the right lower lung posteriorly. This is unchanged since the prior study. Calcified lymph nodes in the subcarinal region. Mild pericardial thickening. Prominent coronary artery calcifications. Central venous catheter tip at the cavoatrial junction. Hepatobiliary: No focal liver abnormality is seen. No gallstones, gallbladder wall thickening, or biliary dilatation. Pancreas: Unremarkable. No pancreatic ductal dilatation or surrounding inflammatory changes. Spleen: Normal in size without focal abnormality. Adrenals/Urinary Tract: No adrenal gland nodules. Bilateral ureteral stents. Bladder is decompressed with a Foley catheter. Multiple bilateral intrarenal stones are demonstrated. Stranding around the kidneys, greatest on the left. Prominent stranding and infiltration around the ureters and in the pelvis, increasing since prior study. This could represent infectious or inflammatory change or possibly urine extravasation. Retroperitoneal fibrosis would be unlikely to progress as much in this short of the time. Stomach/Bowel: Stomach, small bowel, and colon are not abnormally distended. A few scattered colonic diverticular present without evidence of diverticulitis. Appendix is normal. Vascular/Lymphatic: Aortic atherosclerosis. No enlarged abdominal or pelvic lymph nodes. Reproductive:  Prostate is unremarkable. Other: No free air in the abdomen. Abdominal wall musculature appears intact. Musculoskeletal: Left hip arthroplasty. Degenerative changes in the spine. No destructive bone lesions. IMPRESSION: 1. Bilateral ureteral stents and Foley catheter in the bladder. Multiple bilateral intrarenal stones. 2. Prominent stranding and infiltration around the ureters and in the pelvis, increasing since prior study. This could represent infectious or inflammatory change or possibly urine extravasation. Retroperitoneal fibrosis would be unlikely to progress as much in this short of the time. 3. Focal atelectasis and scarring in the medial right lower lung is unchanged since the prior study. 4. Prominent coronary artery calcifications. 5. Aortic atherosclerosis. Aortic Atherosclerosis (ICD10-I70.0). Electronically Signed   By: Oren Beckmann.D.  On: 07/29/2020 19:41   CT ABDOMEN PELVIS WO CONTRAST  Result Date: 07/26/2020 CLINICAL DATA:  65 year old male with abdominal distension. EXAM: CT ABDOMEN AND PELVIS WITHOUT CONTRAST TECHNIQUE: Multidetector CT imaging of the abdomen and pelvis was performed following the standard protocol without IV contrast. COMPARISON:  None. FINDINGS: Evaluation of this exam is limited in the absence of intravenous contrast. Lower chest: Right lower lobe subpleural subsegmental atelectasis/scarring. The visualized lung bases are otherwise clear. There is coronary vascular calcification. Partially visualized central venous line with tip in the region of the cavoatrial junction. No intra-abdominal free air or free fluid. Hepatobiliary: There is diffuse fatty liver. No intrahepatic biliary dilatation. The gallbladder is unremarkable. Pancreas: Unremarkable. No pancreatic ductal dilatation or surrounding inflammatory changes. Spleen: Normal in size without focal abnormality. Adrenals/Urinary Tract: The adrenal glands unremarkable. There is an 11 mm stone at the left  ureteropelvic junction with mild left hydronephrosis. Additional nonobstructing left renal calculi measure up to 1 cm in the upper pole of the left kidney. There is a punctate nonobstructing stone in the distal left ureter (88/2). Multiple nonobstructing right renal stones. Two adjacent stones in the right renal pelvis with combined length of 12 mm. There is no hydronephrosis on the right. There is a 3 cm right renal inferior pole cyst. The right ureter is unremarkable. The urinary bladder is partially distended. There is air within the bladder which may be related to recent instrumentation. Correlation with urinalysis recommended to exclude superimposed UTI. Stomach/Bowel: There is sigmoid diverticulosis without active inflammatory changes. There is no bowel obstruction or active inflammation. The appendix is normal. Vascular/Lymphatic: Mild aortoiliac atherosclerotic disease. The IVC is unremarkable. The retroaortic left renal vein anatomy. No portal venous gas. There is no adenopathy. Reproductive: The prostate and seminal vesicles are grossly. No pelvic mass. Other: Small fat containing left inguinal hernia. Musculoskeletal: Osteopenia with degenerative changes of the spine. Total left hip arthroplasty. Subcutaneous stranding and edema adjacent to the left hip. No acute osseous pathology. IMPRESSION: 1. A 11 mm left UPJ stone with mild left hydronephrosis. A punctate nonobstructing stone is also noted the distal left ureter. Additional nonobstructing bilateral renal calculi. No hydronephrosis on the right. 2. Fatty liver. 3. Sigmoid diverticulosis. No bowel obstruction. Normal appendix. 4. Aortic Atherosclerosis (ICD10-I70.0). Electronically Signed   By: Anner Crete M.D.   On: 07/26/2020 20:24   DG Knee 1-2 Views Left  Result Date: 07/20/2020 CLINICAL DATA:  Status post left knee replacement today. EXAM: LEFT KNEE - 1-2 VIEW COMPARISON:  None. FINDINGS: Left knee arthroplasty is in place. Gas in the  soft tissues and surgical staples are noted. No acute abnormality. IMPRESSION: Status post left knee replacement.  No acute abnormality. Electronically Signed   By: Inge Rise M.D.   On: 07/20/2020 10:48   DG Abd 1 View  Result Date: 07/29/2020 CLINICAL DATA:  Nasogastric tube placement EXAM: ABDOMEN - 1 VIEW COMPARISON:  07/28/2020 FINDINGS: Nasogastric tube tip seen within the proximal to mid body of the stomach. The visualized abdominal gas pattern is unremarkable. Pelvis excluded from view. Bilateral ureteral stents are partially visualized overlying the expected renal pelves bilaterally. IMPRESSION: Nasogastric tube tip within the proximal to mid body of the stomach. Electronically Signed   By: Fidela Salisbury MD   On: 07/29/2020 01:28   DG Abd 1 View  Result Date: 07/28/2020 CLINICAL DATA:  Abdominal distension.  Constipation. EXAM: ABDOMEN - 1 VIEW COMPARISON:  Abdominal CT 2 days ago. FINDINGS: Bilateral ureteral stents in  place. Renal calculi on prior CT are not well visualized on the current exam. Increased air throughout small and large bowel. Small volume of formed stool in the colon. Left hip arthroplasty. IMPRESSION: Increased air throughout small and large bowel suggesting ileus. Bilateral ureteral stents in place. Electronically Signed   By: Keith Rake M.D.   On: 07/28/2020 20:46   US Venous Img Lower Unilateral Left (DVT)  Result Date: 07/28/2020 CLINICAL DATA:  Left leg edema since knee replacement Pain EXAM: LEFT LOWER EXTREMITY VENOUS DOPPLER ULTRASOUND TECHNIQUE: Gray-scale sonography with compression, as well as color and duplex ultrasound, were performed to evaluate the deep venous system(s) from the level of the common femoral vein through the popliteal and proximal calf veins. COMPARISON:  None. FINDINGS: VENOUS Normal compressibility of the common femoral, superficial femoral, and popliteal veins, as well as the visualized calf veins. Visualized portions of profunda  femoral vein and great saphenous vein unremarkable. No filling defects to suggest DVT on grayscale or color Doppler imaging. Doppler waveforms show normal direction of venous flow, normal respiratory plasticity and response to augmentation. Limited views of the contralateral common femoral vein are unremarkable. OTHER None. Limitations: none IMPRESSION: No left lower extremity DVT Electronically Signed   By: Miachel Roux M.D.   On: 07/28/2020 15:59   DG Chest Port 1 View  Result Date: 07/26/2020 CLINICAL DATA:  65 year old male with concern for sepsis. EXAM: PORTABLE CHEST 1 VIEW COMPARISON:  None. FINDINGS: Evaluation is limited due to positioning of the patient and superimposition of the jaw over the left apex. Right-sided Port-A-Cath with tip in the region of the cavoatrial junction. There are bibasilar linear atelectasis. No focal consolidation, pleural effusion, pneumothorax. Mild cardiomegaly. No acute osseous pathology. IMPRESSION: 1. No acute cardiopulmonary process. 2. Mild cardiomegaly. Electronically Signed   By: Anner Crete M.D.   On: 07/26/2020 18:52   DG Knee Left Port  Result Date: 07/26/2020 CLINICAL DATA:  Knee pain.  Sepsis EXAM: PORTABLE LEFT KNEE - 1-2 VIEW COMPARISON:  None. FINDINGS: Post total knee arthroplasty. Prosthetic components appear well seated. Expected soft tissue changes. 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 Electronically Signed   By: Suzy Bouchard M.D.   On: 07/26/2020 18:52   DG OR UROLOGY CYSTO IMAGE ([facility] ONLY)  Result Date: 07/26/2020 There is no interpretation for this exam.  This order is for images obtained during a surgical procedure.  Please See "Surgeries" Tab for more information regarding the procedure.    Microbiology: Recent Results (from the past 240 hour(s))  Resp Panel by RT-PCR (Flu A&B, Covid) Nasopharyngeal Swab     Status: None   Collection Time: 07/26/20  6:15 PM   Specimen: Nasopharyngeal Swab;  Nasopharyngeal(NP) swabs in vial transport medium  Result Value Ref Range Status   SARS Coronavirus 2 by RT PCR NEGATIVE NEGATIVE Final    Comment: (NOTE) SARS-CoV-2 target nucleic acids are NOT DETECTED.  The SARS-CoV-2 RNA is generally detectable in upper respiratory specimens during the acute phase of infection. The lowest concentration of SARS-CoV-2 viral copies this assay can detect is 138 copies/mL. A negative result does not preclude SARS-Cov-2 infection and should not be used as the sole basis for treatment or other patient management decisions. A negative result may occur with  improper specimen collection/handling, submission of specimen other than nasopharyngeal swab, presence of viral mutation(s) within the areas targeted by this assay, and inadequate number of viral copies(<138 copies/mL). A negative result must be combined with clinical observations, patient history,  and epidemiological information. The expected result is Negative.  Fact Sheet for Patients:  EntrepreneurPulse.com.au  Fact Sheet for Healthcare Providers:  IncredibleEmployment.be  This test is no t yet approved or cleared by the [place] FDA and  has been authorized for detection and/or diagnosis of SARS-CoV-2 by FDA under an Emergency Use Authorization (EUA). This EUA will remain  in effect (meaning this test can be used) for the duration of the COVID-19 declaration under Section 564(b)(1) of the Act, 21 U.S.C.section 360bbb-3(b)(1), unless the authorization is terminated  or revoked sooner.       Influenza A by PCR NEGATIVE NEGATIVE Final   Influenza B by PCR NEGATIVE NEGATIVE Final    Comment: (NOTE) The Xpert Xpress SARS-CoV-2/FLU/RSV plus assay is intended as an aid in the diagnosis of influenza from Nasopharyngeal swab specimens and should not be used as a sole basis for treatment. Nasal washings and aspirates are unacceptable for Xpert Xpress  SARS-CoV-2/FLU/RSV testing.  Fact Sheet for Patients: EntrepreneurPulse.com.au  Fact Sheet for Healthcare Providers: IncredibleEmployment.be  This test is not yet approved or cleared by the [place] FDA and has been authorized for detection and/or diagnosis of SARS-CoV-2 by FDA under an Emergency Use Authorization (EUA). This EUA will remain in effect (meaning this test can be used) for the duration of the COVID-19 declaration under Section 564(b)(1) of the Act, 21 U.S.C. section 360bbb-3(b)(1), unless the authorization is terminated or revoked.  Performed at [facility], [address] [place], [place] [address]   Blood Culture (routine x 2)     Status: Abnormal (Preliminary result)   Collection Time: 07/26/20  6:15 PM   Specimen: BLOOD  Result Value Ref Range Status   Specimen Description   Final    BLOOD RIGHT ANTECUBITAL Performed at [facility], [address], [place], [place] [address]    Special Requests   Final    BOTTLES DRAWN AEROBIC AND ANAEROBIC Blood Culture adequate volume Performed at [facility], [address], [place], [place] [address]    Culture  Setup Time   Final    ANAEROBIC BOTTLE ONLY GRAM POSITIVE COCCI Organism ID to follow CRITICAL RESULT CALLED TO, READ BACK BY AND VERIFIED WITH: SAMANTHA RAUER @2018  07/28/20 MJU Performed at [facility], [address], [place], [place] [address]    Culture (A)  Final    STAPHYLOCOCCUS CAPITIS REPEATING SENSITIVITES Performed at [facility], [place] [address], [place], [place] [address]    Report Status PENDING  Incomplete  Blood Culture (routine x 2)     Status: None   Collection Time: 07/26/20  6:15 PM   Specimen: BLOOD  Result Value Ref Range Status   Specimen Description BLOOD LEFT ANTECUBITAL  Final   Special Requests   Final    BOTTLES DRAWN AEROBIC AND ANAEROBIC Blood Culture results may not be optimal due  to an inadequate volume of blood received in culture bottles   Culture   Final    NO GROWTH 5 DAYS Performed at [facility], [address], [place], [place] [address]    Report Status 07/31/2020 FINAL  Final  Urine culture     Status: Abnormal   Collection Time: 07/26/20  6:15 PM   Specimen: Urine, Random  Result Value Ref Range Status   Specimen Description   Final    URINE, RANDOM Performed at [facility], [address], [place], [place] [address]    Special Requests  Final    NONE Performed at [facility], [place], [place] [address]    Culture >=100,000 COLONIES/mL ESCHERICHIA COLI (A)  Final   Report Status 07/29/2020 FINAL  Final   Organism ID, Bacteria ESCHERICHIA COLI (A)  Final      Susceptibility   Escherichia coli - MIC*    AMPICILLIN >=32 RESISTANT Resistant     CEFAZOLIN <=4 SENSITIVE Sensitive     CEFEPIME <=0.12 SENSITIVE Sensitive     CEFTRIAXONE <=0.25 SENSITIVE Sensitive     CIPROFLOXACIN 0.5 SENSITIVE Sensitive     GENTAMICIN <=1 SENSITIVE Sensitive     IMIPENEM <=0.25 SENSITIVE Sensitive     NITROFURANTOIN <=16 SENSITIVE Sensitive     TRIMETH/SULFA <=20 SENSITIVE Sensitive     AMPICILLIN/SULBACTAM >=32 RESISTANT Resistant     PIP/TAZO <=4 SENSITIVE Sensitive     * >=100,000 COLONIES/mL ESCHERICHIA COLI  Blood Culture ID Panel (Reflexed)     Status: Abnormal   Collection Time: 07/26/20  6:15 PM  Result Value Ref Range Status   Enterococcus faecalis NOT DETECTED NOT DETECTED Final   Enterococcus Faecium NOT DETECTED NOT DETECTED Final   Listeria monocytogenes NOT DETECTED NOT DETECTED Final   Staphylococcus species DETECTED (A) NOT DETECTED Final    Comment: CRITICAL RESULT CALLED TO, READ BACK BY AND VERIFIED WITH: SAMANTHA RAUER @2018  07/28/20 MJU    Staphylococcus aureus (BCID) NOT DETECTED NOT DETECTED Final   Staphylococcus epidermidis NOT DETECTED NOT DETECTED Final   Staphylococcus lugdunensis  NOT DETECTED NOT DETECTED Final   Streptococcus species NOT DETECTED NOT DETECTED Final   Streptococcus agalactiae NOT DETECTED NOT DETECTED Final   Streptococcus pneumoniae NOT DETECTED NOT DETECTED Final   Streptococcus pyogenes NOT DETECTED NOT DETECTED Final   A.calcoaceticus-baumannii NOT DETECTED NOT DETECTED Final   Bacteroides fragilis NOT DETECTED NOT DETECTED Final   Enterobacterales NOT DETECTED NOT DETECTED Final   Enterobacter cloacae complex NOT DETECTED NOT DETECTED Final   Escherichia coli NOT DETECTED NOT DETECTED Final   Klebsiella aerogenes NOT DETECTED NOT DETECTED Final   Klebsiella oxytoca NOT DETECTED NOT DETECTED Final   Klebsiella pneumoniae NOT DETECTED NOT DETECTED Final   Proteus species NOT DETECTED NOT DETECTED Final   Salmonella species NOT DETECTED NOT DETECTED Final   Serratia marcescens NOT DETECTED NOT DETECTED Final   Haemophilus influenzae NOT DETECTED NOT DETECTED Final   Neisseria meningitidis NOT DETECTED NOT DETECTED Final   Pseudomonas aeruginosa NOT DETECTED NOT DETECTED Final   Stenotrophomonas maltophilia NOT DETECTED NOT DETECTED Final   Candida albicans NOT DETECTED NOT DETECTED Final   Candida auris NOT DETECTED NOT DETECTED Final   Candida glabrata NOT DETECTED NOT DETECTED Final   Candida krusei NOT DETECTED NOT DETECTED Final   Candida parapsilosis NOT DETECTED NOT DETECTED Final   Candida tropicalis NOT DETECTED NOT DETECTED Final   Cryptococcus neoformans/gattii NOT DETECTED NOT DETECTED Final    Comment: Performed at [facility], [address] [place], [place] [address]  C Difficile Quick Screen w PCR reflex     Status: None   Collection Time: 07/28/20 10:15 PM   Specimen: STOOL  Result Value Ref Range Status   C Diff antigen NEGATIVE NEGATIVE Final   C Diff toxin NEGATIVE NEGATIVE Final   C Diff interpretation No C. difficile detected.  Final    Comment: Performed at [facility], [address],  [place], [place] [address]  Urine Culture     Status: None  Collection Time: 07/30/20 10:49 AM   Specimen: Urine, Random  Result Value Ref Range Status   Specimen Description   Final    URINE, RANDOM Performed at [facility], [address] [place], [place] [address]    Special Requests   Final    NONE Performed at [facility], [address], [place], [place] [address]    Culture   Final    NO GROWTH Performed at [facility], [place] [address], [place], [place] [address]    Report Status 07/31/2020 FINAL  Final  CULTURE, BLOOD (ROUTINE X 2) w Reflex to ID Panel     Status: None (Preliminary result)   Collection Time: 07/30/20  2:38 PM   Specimen: BLOOD  Result Value Ref Range Status   Specimen Description BLOOD BLOOD RIGHT HAND  Final   Special Requests   Final    BOTTLES DRAWN AEROBIC AND ANAEROBIC Blood Culture adequate volume   Culture   Final    NO GROWTH 1 DAY Performed at [facility], [address] [place],  [address]    Report Status PENDING  Incomplete  CULTURE, BLOOD (ROUTINE X 2) w Reflex to ID Panel     Status: None (Preliminary result)   Collection Time: 07/30/20  2:38 PM   Specimen: BLOOD  Result Value Ref Range Status   Specimen Description BLOOD BLOOD LEFT HAND  Final   Special Requests   Final    BOTTLES DRAWN AEROBIC AND ANAEROBIC Blood Culture adequate volume   Culture   Final    NO GROWTH 1 DAY Performed at [facility], [address] [place],  [address]    Report Status PENDING  Incomplete     Labs: Basic Metabolic Panel: Recent Labs  Lab 07/27/20 0753 07/28/20 0431 07/29/20 0533 07/30/20 0542 07/31/20 0508  NA 135 137 141 140 139  K 3.6 3.1* 3.6 3.6 3.2*  CL 97* 98 102 103 103  CO2 27 28 28 27 27   GLUCOSE 166* 137* 127* 121* 130*  BUN 40* 47* 36* 39* 39*  CREATININE 2.14* 1.81* 1.61* 1.40* 1.33*  CALCIUM 8.0* 8.2* 9.0 8.8* 8.7*  MG  --   --  2.0  --  1.9   Liver Function  Tests: Recent Labs  Lab 07/26/20 1815 07/30/20 0542  AST 26 36  ALT 25 48*  ALKPHOS 63 77  BILITOT 0.9 0.6  PROT 6.5 6.1*  ALBUMIN 2.8* 2.5*   Recent Labs  Lab 07/30/20 0542  LIPASE 36   No results for input(s): AMMONIA in the last 168 hours. CBC: Recent Labs  Lab 07/27/20 0753 07/28/20 0431 07/29/20 0533 07/30/20 0542 07/31/20 0508  WBC 20.7* 17.7* 15.4* 16.6* 14.1*  NEUTROABS 19.0* 16.3* 14.0* 14.8* 12.1*  HGB 9.4* 9.3* 10.1* 10.8* 10.6*  HCT 28.5* 27.6* 30.2* 32.0* 31.4*  MCV 81.2 80.2 80.1 80.6 80.1  PLT 274 325 362 386 412*   Cardiac Enzymes: No results for input(s): CKTOTAL, CKMB, CKMBINDEX, TROPONINI in the last 168 hours. BNP: BNP (last 3 results) No results for input(s): BNP in the last 8760 hours.  ProBNP (last 3 results) No results for input(s): PROBNP in the last 8760 hours.  CBG: Recent Labs  Lab 07/30/20 1209 07/30/20 1535 07/30/20 2110 07/31/20 0755 07/31/20 1121  GLUCAP 109* 194* 142* 122* 220*       Signed:  Florencia Reasons MD, PhD, FACP  [facility] 07/31/2020, 9:47 PM

## 2020-07-31 NOTE — TOC Transition Note (Signed)
Transition of Care [facility]) - CM/SW Discharge Note   Patient Details  Name: Austin Sullivan MRN: [id number] Date of Birth: 09-26-1955  [facility]) CM/SW Contact:  Magnus Ivan, LCSW Phone Number: 07/31/2020, 12:08 PM   Clinical Narrative:   Per RN in rounds, patient to DC home today. CSW notified [place] with [facility].    Final next level of care: [place] Barriers to Discharge: Barriers Resolved   Patient Goals and CMS Choice Patient states their goals for this hospitalization and ongoing recovery are:: to go home      Discharge Placement                       Discharge Plan and Services     Post Acute Care Choice: [place]: PT,OT [place] Agency: [facility] [employer] Date [place]: 07/31/20 Time [place]: [place] Representative spoke with at [place]: [place] Pack  Social Determinants of Health ([place]) Interventions     Readmission Risk Interventions No flowsheet data found.

## 2020-07-31 NOTE — Progress Notes (Signed)
Physical Therapy Treatment Patient Details Name: Austin Sullivan MRN: [id number] DOB: 10/01/1955 Today's Date: 07/31/2020    History of Present Illness Pt is a 64 y.o. male with medical history significant for chronic pain, hyperlipidemia, hypertension, COPD, history of Hodgkin's lymphoma, stage IV melanoma with past chemotherapy for 5 months after 41 of 55 cycles, vocal cord paralysis, OSA, baseline exertional dyspnea, obesity, osteoarthritis status post left knee arthroplasty in 07/20/2020, and DM who presented to the emergency department for chief concerns of low blood pressure. MD assessment includes: severe sepsis secondary to obstructive ureteral stone now s/p stent placement and osteoarthritis of the left knee status post total knee arthroplasty 07/20/20.    PT Comments    Pt was long sitting in bed upon arriving. He agrees to PT session and is cooperative and motivated. Excited to DC home later this date. He was easily able to exit L side of bed, stand, and ambulate with RW without LOB or safety concern. Recommend DC home with HHPT to follow to progress pt to PLOF. He was sitting in recliner post session with call bell in reach and wife at bedside.    Follow Up Recommendations  Home health PT;Supervision for mobility/OOB     Equipment Recommendations  None recommended by PT       Precautions / Restrictions Precautions Precautions: Fall Restrictions Weight Bearing Restrictions: No LLE Weight Bearing: Weight bearing as tolerated    Mobility  Bed Mobility Overal bed mobility: Modified Independent Bed Mobility: Supine to Sit     Supine to sit: Supervision Sit to supine: Supervision   General bed mobility comments: HOB slightly elevated    Transfers Overall transfer level: Needs assistance Equipment used: Rolling walker (2 wheeled) Transfers: Sit to/from Stand Sit to Stand: Supervision            Ambulation/Gait Ambulation/Gait assistance: Supervision Gait Distance  (Feet): 400 Feet Assistive device: Rolling walker (2 wheeled) Gait Pattern/deviations: Step-through pattern     General Gait Details: no LOB or diffiuclty with ambulation   Stairs  General stair comments: Reviewed proper sequencing for stairs. Both pt/pt's spouse feel confident in abilities.     Balance Overall balance assessment: Needs assistance Sitting-balance support: Feet supported Sitting balance-Leahy Scale: Good     Standing balance support: Bilateral upper extremity supported;During functional activity Standing balance-Leahy Scale: Good        Cognition Arousal/Alertness: Awake/alert Behavior During Therapy: WFL for tasks assessed/performed Overall Cognitive Status: Within Functional Limits for tasks assessed        General Comments: Pt is A and O x 4      Exercises Total Joint Exercises Goniometric ROM: 95 degrees        Pertinent Vitals/Pain Pain Assessment: No/denies pain Pain Score: 0-No pain           PT Goals (current goals can now be found in the care plan section) Acute Rehab PT Goals Patient Stated Goal: To get better and return home Progress towards PT goals: Progressing toward goals    Frequency    7X/week      PT Plan Current plan remains appropriate       AM-PAC PT "6 Clicks" Mobility   Outcome Measure  Help needed turning from your back to your side while in a flat bed without using bedrails?: None Help needed moving from lying on your back to sitting on the side of a flat bed without using bedrails?: None Help needed moving to and from a bed to a chair (  including a wheelchair)?: None Help needed standing up from a chair using your arms (e.g., wheelchair or bedside chair)?: A Little Help needed to walk in hospital room?: A Little Help needed climbing 3-5 steps with a railing? : A Little 6 Click Score: 21    End of Session Equipment Utilized During Treatment: Gait belt Activity Tolerance: Patient tolerated treatment  well Patient left: in chair;with call bell/phone within reach;with chair alarm set Nurse Communication: Mobility status PT Visit Diagnosis: Other abnormalities of gait and mobility (R26.89);Muscle weakness (generalized) (M62.81);Pain Pain - Right/Left: Left Pain - part of body: Knee     Time: 0905-0929 PT Time Calculation (min) (ACUTE ONLY): 24 min  Charges:  $Gait Training: 8-22 mins $Therapeutic Exercise: 8-22 mins                     Julaine Fusi PTA 07/31/20, 12:30 PM

## 2020-08-01 LAB — CULTURE, BLOOD (ROUTINE X 2): Special Requests: ADEQUATE

## 2020-08-04 LAB — CULTURE, BLOOD (ROUTINE X 2)
Culture: NO GROWTH
Culture: NO GROWTH
Special Requests: ADEQUATE
Special Requests: ADEQUATE

## 2020-08-09 ENCOUNTER — Other Ambulatory Visit: Payer: Self-pay | Admitting: Urology

## 2020-08-09 DIAGNOSIS — N201 Calculus of ureter: Secondary | ICD-10-CM

## 2020-08-09 DIAGNOSIS — N200 Calculus of kidney: Secondary | ICD-10-CM

## 2020-08-11 ENCOUNTER — Other Ambulatory Visit: Payer: Self-pay | Admitting: Urology

## 2020-08-13 ENCOUNTER — Other Ambulatory Visit: Payer: Self-pay

## 2020-08-13 DIAGNOSIS — N201 Calculus of ureter: Secondary | ICD-10-CM

## 2020-08-13 DIAGNOSIS — N200 Calculus of kidney: Secondary | ICD-10-CM

## 2020-08-16 ENCOUNTER — Other Ambulatory Visit: Payer: Medicare Other

## 2020-08-16 ENCOUNTER — Other Ambulatory Visit: Payer: Self-pay

## 2020-08-16 DIAGNOSIS — N200 Calculus of kidney: Secondary | ICD-10-CM

## 2020-08-16 DIAGNOSIS — N201 Calculus of ureter: Secondary | ICD-10-CM

## 2020-08-17 ENCOUNTER — Inpatient Hospital Stay: Admission: RE | Admit: 2020-08-17 | Payer: Medicare Other | Source: Ambulatory Visit

## 2020-08-19 ENCOUNTER — Encounter
Admission: RE | Admit: 2020-08-19 | Discharge: 2020-08-19 | Disposition: A | Discharge status: Home / Self Care | Payer: Medicare Other | Source: Ambulatory Visit | Attending: Urology | Admitting: Urology

## 2020-08-19 ENCOUNTER — Other Ambulatory Visit: Payer: Self-pay

## 2020-08-19 HISTORY — DX: Hodgkin lymphoma, unspecified, unspecified site: C81.90

## 2020-08-19 HISTORY — DX: Irritable bowel syndrome with constipation: K58.1

## 2020-08-19 HISTORY — DX: Polyneuropathy, unspecified: G62.9

## 2020-08-19 HISTORY — DX: Encounter for antineoplastic chemotherapy: Z51.11

## 2020-08-19 HISTORY — DX: Benign prostatic hyperplasia without lower urinary tract symptoms: N40.0

## 2020-08-19 HISTORY — DX: Paralysis of vocal cords and larynx, unspecified: J38.00

## 2020-08-19 HISTORY — DX: Personal history of urinary calculi: Z87.442

## 2020-08-19 LAB — URINALYSIS, COMPLETE
Bilirubin, UA: NEGATIVE
Glucose, UA: NEGATIVE
Nitrite, UA: NEGATIVE
Specific Gravity, UA: >1.03 — ABNORMAL HIGH (ref 1.005–1.030)
Urobilinogen, Ur: 0.2 mg/dL (ref 0.2–1.0)
pH, UA: 5.5 (ref 5.0–7.5)

## 2020-08-19 LAB — MICROSCOPIC EXAMINATION: RBC, Urine: >30 /hpf — ABNORMAL HIGH (ref 0–2)

## 2020-08-19 NOTE — Patient Instructions (Addendum)
INSTRUCTIONS FOR SURGERY     Your surgery is scheduled for:   Friday, MAY 27TH     To find out your arrival time for the day of surgery,          please call [phone] between 1 pm and 3 pm on :  Thursday, MAY 26TH     When you arrive for surgery, report to the [facility]. ONCE THEY HAVE FINISHED THEIR PROCESS, PROCEED TO THE SECOND  FLOOR AND SIGN IN AT THE SURGERY DESK.  REMEMBER: Instructions that are not followed completely may result in serious medical risk,  up to and including death, or upon the discretion of your surgeon and anesthesiologist,            your surgery may need to be rescheduled.  __X__ 1. Do not eat food after midnight the night before your procedure.                    No gum, candy, lozenger, tic tacs, tums or hard candies.                  ABSOLUTELY NOTHING SOLID IN YOUR MOUTH AFTER MIDNIGHT                    You may drink unlimited clear liquids up to 2 hours before you are scheduled to arrive for surgery.                   Do not drink anything within those 2 hours unless you need to take medicine, then take the                   smallest amount you need.  Clear liquids include:  water, Black coffee, black tea.                  Sugar may be added but no dairy/ honey /lemon.                        Broth and jello is not considered a clear liquid.  __x__  2. On the morning of surgery, please brush your teeth with toothpaste and water. You may rinse with                  mouthwash if you wish but DO NOT SWALLOW TOOTHPASTE OR MOUTHWASH  __X___3. NO alcohol for 24 hours before or after surgery.  __x___ 4.  Do NOT smoke or use e-cigarettes for 24 HOURS PRIOR TO SURGERY.                      DO NOT Use any chewable tobacco products for at least 6 hours prior to surgery.  __x___ 5. If you start any new medication after this appointment and prior to surgery,  please                   Bring it with you on the day of surgery.  ___x__ 6. Notify your doctor if there is any change in your medical condition,  such as fever,                  infection, vomitting, diarrhea or any open sores.  __x___ 7.  USE ANTIBACTERIAL SOAP as instructed, the night before surgery and the day of surgery.                   Once you have washed with this soap, do NOT use any of the following: Powders, perfumes                    or lotions. Please do not wear make up, hairpins, clips or nail polish. You MAY  wear deodorant.                                                       Men may shave their face and neck.                              DO NOT wear ANY jewelry on the day of surgery. If there are rings that are too tight to                    remove easily, please address this prior to the surgery day. Piercings need to be removed.                                                                     NO METAL ON YOUR BODY.                    Do NOT bring any valuables.  If you came to Pre-Admit testing then you will not need license,                     insurance card or credit card.  If you will be staying overnight, please either leave your things in                     the car or have your family be responsible for these items.                     [facility] IS NOT RESPONSIBLE FOR BELONGINGS OR VALUABLES.  ___X__ 8. DO NOT wear contact lenses on surgery day.  You may not have dentures,                     Hearing aides, contacts or glasses in the operating room. These items can be                    Placed in the Recovery Room to receive immediately after surgery.  __x___ 9. IF YOU ARE SCHEDULED TO GO HOME ON THE SAME DAY, YOU MUST                   Have someone to drive you home and to stay with you  for the first 24 hours.  Have an arrangement prior to arriving on surgery day.  ___x__ 10. Take the following medications on the morning of surgery  with a sip of water:                              1.BREO ELLIPTA                     2.AVODART/DUTASTERIDE                     3.METOPROLOL/ LOPRESSOR                     4.LYRICA                     5.FLONASE, AZELASTINE AND ATROVENT NASAL SPRAYS                     6.RESTASIS EYE DROPS                     7.UROXATROL/ALFUZOSIN   __X__  12. STOP ALL ASPIRIN AND ASPIRIN PRODUCTS AS OF TODAY, MAY 19TH                       THIS INCLUDES BC POWDERS / GOODIES POWDER  __x___ 13. STOP Anti-inflammatories as of TODAY, MAY 19TH                      This includes IBUPROFEN / MOTRIN / ADVIL / ALEVE/ NAPROXYN                    YOU MAY TAKE TYLENOL ANY TIME PRIOR TO SURGERY.  _____ 14.  Stop supplements until after surgery.                  _X____ 15. Bring your CPAP machine into preop with you on the morning of surgery.   __X__16.  Stop Metformin 2 full days prior to surgery. LAST DOSE ON Tuesday, 08/24/20                                         Do NOT take any diabetes medications on surgery day.  ___X___17.  Continue to take the following medications but do not take on the morning of surgery:                        OVER THE COUNTER MEDICINES, SUPPLEMENTAL MEDICINES  __X____18.  Wear clean and comfortable clothing to the hospital.  BRING PHONE [place].  BRING YOUR CPAP INTO THE PREOP AREA.  CONTINUE TO TAKE YOUR EVENING MEDICATIONS AS USUAL.

## 2020-08-20 ENCOUNTER — Other Ambulatory Visit
Admission: RE | Admit: 2020-08-20 | Discharge: 2020-08-20 | Disposition: A | Discharge status: Home / Self Care | Payer: Medicare Other | Source: Ambulatory Visit | Attending: Urology | Admitting: Urology

## 2020-08-20 DIAGNOSIS — Z01812 Encounter for preprocedural laboratory examination: Secondary | ICD-10-CM | POA: Insufficient documentation

## 2020-08-20 LAB — CBC
HCT: 32 % — ABNORMAL LOW (ref 39.0–52.0)
Hemoglobin: 10.1 g/dL — ABNORMAL LOW (ref 13.0–17.0)
MCH: 27.1 pg (ref 26.0–34.0)
MCHC: 31.6 g/dL (ref 30.0–36.0)
MCV: 85.8 fL (ref 80.0–100.0)
Platelets: 342 10*3/uL (ref 150–400)
RBC: 3.73 MIL/uL — ABNORMAL LOW (ref 4.22–5.81)
RDW: 20.1 % — ABNORMAL HIGH (ref 11.5–15.5)
WBC: 6.6 10*3/uL (ref 4.0–10.5)
nRBC: 0 % (ref 0.0–0.2)

## 2020-08-20 LAB — BASIC METABOLIC PANEL
Anion gap: 9 (ref 5–15)
BUN: 15 mg/dL (ref 8–23)
CO2: 25 mmol/L (ref 22–32)
Calcium: 8.9 mg/dL (ref 8.9–10.3)
Chloride: 107 mmol/L (ref 98–111)
Creatinine, Ser: 0.78 mg/dL (ref 0.61–1.24)
GFR, Estimated: >60 mL/min (ref >60)
Glucose, Bld: 148 mg/dL — ABNORMAL HIGH (ref 70–99)
Potassium: 3.9 mmol/L (ref 3.5–5.1)
Sodium: 141 mmol/L (ref 135–145)

## 2020-08-25 LAB — CULTURE, URINE COMPREHENSIVE

## 2020-08-26 ENCOUNTER — Telehealth: Payer: Self-pay | Admitting: Urology

## 2020-08-26 MED ORDER — SODIUM CHLORIDE 0.9 % IV SOLN: INTRAVENOUS | Status: DC

## 2020-08-26 MED ORDER — CIPROFLOXACIN IN D5W 400 MG/200ML IV SOLN: 400.0000 mg | INTRAVENOUS | Status: AC

## 2020-08-26 MED ORDER — FAMOTIDINE 20 MG PO TABS: 20.0000 mg | ORAL_TABLET | Freq: Once | ORAL | Status: AC

## 2020-08-26 MED ORDER — ORAL CARE MOUTH RINSE: 15.0000 mL | Freq: Once | OROMUCOSAL | Status: AC

## 2020-08-26 MED ORDER — CHLORHEXIDINE GLUCONATE 0.12 % MT SOLN
15.0000 mL | Freq: Once | OROMUCOSAL | Status: AC
Start: 2020-08-26 — End: 2020-08-27

## 2020-08-26 NOTE — Telephone Encounter (Signed)
Called Pharmacy to confirm RX sent, RX was picked up by patient 08/25/20 at 7:15pm.

## 2020-08-26 NOTE — Telephone Encounter (Signed)
Contacted by nursing line last night that patient experiencing dysuria, urgency, frequency. Culture with 2k colonies staph. In setting of symptoms and upcoming URS/LL/Stent this Friday, I recommended Nitrofurantoin 100mg  BID X 7 days  Nickolas Madrid, MD 08/26/2020

## 2020-08-27 ENCOUNTER — Encounter
Admission: RE | Disposition: A | Discharge status: Home / Self Care | Payer: Self-pay | Source: Home / Self Care | Attending: Urology

## 2020-08-27 ENCOUNTER — Ambulatory Visit: Payer: Medicare Other | Admitting: Urgent Care

## 2020-08-27 ENCOUNTER — Ambulatory Visit: Payer: Medicare Other | Admitting: Anesthesiology

## 2020-08-27 ENCOUNTER — Other Ambulatory Visit: Payer: Self-pay

## 2020-08-27 ENCOUNTER — Encounter: Payer: Self-pay | Admitting: Urology

## 2020-08-27 ENCOUNTER — Ambulatory Visit: Payer: Medicare Other

## 2020-08-27 ENCOUNTER — Ambulatory Visit
Admission: RE | Admit: 2020-08-27 | Discharge: 2020-08-27 | Disposition: A | Discharge status: Home / Self Care | Payer: Medicare Other | Attending: Urology | Admitting: Urology

## 2020-08-27 DIAGNOSIS — Z9221 Personal history of antineoplastic chemotherapy: Secondary | ICD-10-CM | POA: Insufficient documentation

## 2020-08-27 DIAGNOSIS — J449 Chronic obstructive pulmonary disease, unspecified: Secondary | ICD-10-CM | POA: Insufficient documentation

## 2020-08-27 DIAGNOSIS — E119 Type 2 diabetes mellitus without complications: Secondary | ICD-10-CM | POA: Insufficient documentation

## 2020-08-27 DIAGNOSIS — Z87891 Personal history of nicotine dependence: Secondary | ICD-10-CM | POA: Insufficient documentation

## 2020-08-27 DIAGNOSIS — N200 Calculus of kidney: Secondary | ICD-10-CM | POA: Insufficient documentation

## 2020-08-27 DIAGNOSIS — Z87442 Personal history of urinary calculi: Secondary | ICD-10-CM | POA: Diagnosis not present

## 2020-08-27 DIAGNOSIS — I10 Essential (primary) hypertension: Secondary | ICD-10-CM | POA: Diagnosis not present

## 2020-08-27 DIAGNOSIS — N201 Calculus of ureter: Secondary | ICD-10-CM

## 2020-08-27 DIAGNOSIS — Z8571 Personal history of Hodgkin lymphoma: Secondary | ICD-10-CM | POA: Insufficient documentation

## 2020-08-27 HISTORY — PX: CYSTOSCOPY/URETEROSCOPY/HOLMIUM LASER/STENT PLACEMENT: SHX6546

## 2020-08-27 LAB — GLUCOSE, CAPILLARY
Glucose-Capillary: 127 mg/dL — ABNORMAL HIGH (ref 70–99)
Glucose-Capillary: 115 mg/dL — ABNORMAL HIGH (ref 70–99)

## 2020-08-27 SURGERY — CYSTOSCOPY/URETEROSCOPY/HOLMIUM LASER/STENT PLACEMENT
Anesthesia: General | Laterality: Bilateral

## 2020-08-27 MED ORDER — ONDANSETRON HCL 4 MG/2ML IJ SOLN
INTRAMUSCULAR | Status: DC | PRN
  Administered 2020-08-27: 4 mg via INTRAVENOUS

## 2020-08-27 MED ORDER — FENTANYL CITRATE (PF) 100 MCG/2ML IJ SOLN
INTRAMUSCULAR | Status: DC | PRN
  Administered 2020-08-27: 50 ug via INTRAVENOUS

## 2020-08-27 MED ORDER — KETOROLAC TROMETHAMINE 30 MG/ML IJ SOLN
INTRAMUSCULAR | Status: AC
  Filled 2020-08-27: qty 1

## 2020-08-27 MED ORDER — SUGAMMADEX SODIUM 500 MG/5ML IV SOLN
INTRAVENOUS | Status: AC
  Filled 2020-08-27: qty 5

## 2020-08-27 MED ORDER — ACETAMINOPHEN 10 MG/ML IV SOLN
INTRAVENOUS | Status: DC | PRN
  Administered 2020-08-27: 1000 mg via INTRAVENOUS

## 2020-08-27 MED ORDER — SUGAMMADEX SODIUM 500 MG/5ML IV SOLN
INTRAVENOUS | Status: DC | PRN
  Administered 2020-08-27: 400 mg via INTRAVENOUS

## 2020-08-27 MED ORDER — GLYCOPYRROLATE 0.2 MG/ML IJ SOLN
INTRAMUSCULAR | Status: AC
  Filled 2020-08-27: qty 1

## 2020-08-27 MED ORDER — CHLORHEXIDINE GLUCONATE 0.12 % MT SOLN
OROMUCOSAL | Status: AC
  Administered 2020-08-27: 15 mL via OROMUCOSAL
  Filled 2020-08-27: qty 15

## 2020-08-27 MED ORDER — BELLADONNA ALKALOIDS-OPIUM 16.2-60 MG RE SUPP
RECTAL | Status: DC | PRN
  Administered 2020-08-27: 1 via RECTAL

## 2020-08-27 MED ORDER — CIPROFLOXACIN IN D5W 400 MG/200ML IV SOLN
INTRAVENOUS | Status: AC
  Administered 2020-08-27: 400 mg via INTRAVENOUS
  Filled 2020-08-27: qty 200

## 2020-08-27 MED ORDER — ROCURONIUM BROMIDE 10 MG/ML (PF) SYRINGE
PREFILLED_SYRINGE | INTRAVENOUS | Status: AC
  Filled 2020-08-27: qty 10

## 2020-08-27 MED ORDER — LIDOCAINE HCL (CARDIAC) PF 100 MG/5ML IV SOSY
PREFILLED_SYRINGE | INTRAVENOUS | Status: DC | PRN
  Administered 2020-08-27: 100 mg via INTRAVENOUS

## 2020-08-27 MED ORDER — ACETAMINOPHEN 10 MG/ML IV SOLN
INTRAVENOUS | Status: AC
  Filled 2020-08-27: qty 100

## 2020-08-27 MED ORDER — ONDANSETRON HCL 4 MG/2ML IJ SOLN: 4.0000 mg | Freq: Once | INTRAMUSCULAR | Status: DC | PRN

## 2020-08-27 MED ORDER — FENTANYL CITRATE (PF) 100 MCG/2ML IJ SOLN: 25.0000 ug | INTRAMUSCULAR | Status: DC | PRN

## 2020-08-27 MED ORDER — ONDANSETRON HCL 4 MG/2ML IJ SOLN
INTRAMUSCULAR | Status: AC
  Filled 2020-08-27: qty 2

## 2020-08-27 MED ORDER — LIDOCAINE HCL (PF) 2 % IJ SOLN
INTRAMUSCULAR | Status: AC
  Filled 2020-08-27: qty 4

## 2020-08-27 MED ORDER — FAMOTIDINE 20 MG PO TABS
ORAL_TABLET | ORAL | Status: AC
  Administered 2020-08-27: 20 mg via ORAL
  Filled 2020-08-27: qty 1

## 2020-08-27 MED ORDER — KETOROLAC TROMETHAMINE 15 MG/ML IJ SOLN
INTRAMUSCULAR | Status: DC | PRN
  Administered 2020-08-27: 15 mg via INTRAVENOUS

## 2020-08-27 MED ORDER — BELLADONNA ALKALOIDS-OPIUM 16.2-60 MG RE SUPP
RECTAL | Status: AC
  Filled 2020-08-27: qty 1

## 2020-08-27 MED ORDER — IOHEXOL 180 MG/ML  SOLN
INTRAMUSCULAR | Status: DC | PRN
  Administered 2020-08-27: 5 mL

## 2020-08-27 MED ORDER — ROCURONIUM BROMIDE 100 MG/10ML IV SOLN
INTRAVENOUS | Status: DC | PRN
  Administered 2020-08-27: 10 mg via INTRAVENOUS
  Administered 2020-08-27: 50 mg via INTRAVENOUS

## 2020-08-27 MED ORDER — MIDAZOLAM HCL 2 MG/2ML IJ SOLN
INTRAMUSCULAR | Status: AC
  Filled 2020-08-27: qty 2

## 2020-08-27 MED ORDER — PROPOFOL 10 MG/ML IV BOLUS
INTRAVENOUS | Status: AC
  Filled 2020-08-27: qty 80

## 2020-08-27 MED ORDER — FENTANYL CITRATE (PF) 100 MCG/2ML IJ SOLN
INTRAMUSCULAR | Status: AC
  Filled 2020-08-27: qty 2

## 2020-08-27 MED ORDER — GENTAMICIN SULFATE 40 MG/ML IJ SOLN
5.0000 mg/kg | Freq: Once | INTRAVENOUS | Status: AC
  Administered 2020-08-27: 570 mg via INTRAVENOUS
  Filled 2020-08-27: qty 14.25

## 2020-08-27 MED ORDER — OXYCODONE HCL 5 MG PO TABS: 5.0000 mg | ORAL_TABLET | Freq: Four times a day (QID) | ORAL | 0 refills | Status: DC | PRN

## 2020-08-27 MED ORDER — PROPOFOL 10 MG/ML IV BOLUS
INTRAVENOUS | Status: DC | PRN
  Administered 2020-08-27: 150 mg via INTRAVENOUS

## 2020-08-27 MED ORDER — GENTAMICIN SULFATE 40 MG/ML IJ SOLN: 2.5000 mg/kg | Freq: Once | INTRAVENOUS | Status: DC

## 2020-08-27 SURGICAL SUPPLY — 30 items
BAG DRAIN CYSTO-URO LG1000N (MISCELLANEOUS) ×2 IMPLANT
BRUSH SCRUB EZ 1% IODOPHOR (MISCELLANEOUS) ×2 IMPLANT
CATH URET FLEX-TIP 2 LUMEN 10F (CATHETERS) IMPLANT
CATH URETL 5X70 OPEN END (CATHETERS) IMPLANT
CNTNR SPEC 2.5X3XGRAD LEK (MISCELLANEOUS)
CONT SPEC 4OZ STER OR WHT (MISCELLANEOUS)
CONT SPEC 4OZ STRL OR WHT (MISCELLANEOUS)
CONTAINER SPEC 2.5X3XGRAD LEK (MISCELLANEOUS) IMPLANT
DRAPE UTILITY 15X26 TOWEL STRL (DRAPES) ×2 IMPLANT
DRSG TEGADERM 2-3/8X2-3/4 SM (GAUZE/BANDAGES/DRESSINGS) ×2 IMPLANT
GLOVE SURG UNDER POLY LF SZ7.5 (GLOVE) ×2 IMPLANT
GOWN STRL REUS W/ TWL LRG LVL3 (GOWN DISPOSABLE) ×1 IMPLANT
GOWN STRL REUS W/ TWL XL LVL3 (GOWN DISPOSABLE) ×1 IMPLANT
GOWN STRL REUS W/TWL LRG LVL3 (GOWN DISPOSABLE) ×2
GOWN STRL REUS W/TWL XL LVL3 (GOWN DISPOSABLE) ×2
GUIDEWIRE STR DUAL SENSOR (WIRE) ×2 IMPLANT
INFUSOR MANOMETER BAG 3000ML (MISCELLANEOUS) ×2 IMPLANT
IV NS IRRIG 3000ML ARTHROMATIC (IV SOLUTION) ×2 IMPLANT
KIT TURNOVER CYSTO (KITS) ×2 IMPLANT
PACK CYSTO AR (MISCELLANEOUS) ×2 IMPLANT
SET CYSTO W/LG BORE CLAMP LF (SET/KITS/TRAYS/PACK) ×2 IMPLANT
SHEATH URETERAL 12FRX35CM (MISCELLANEOUS) IMPLANT
STENT URET 6FRX24 CONTOUR (STENTS) IMPLANT
STENT URET 6FRX26 CONTOUR (STENTS) ×2 IMPLANT
STENT URET 6FRX28 CONTOUR (STENTS) ×2 IMPLANT
SURGILUBE 2OZ TUBE FLIPTOP (MISCELLANEOUS) ×2 IMPLANT
SYR 10ML LL (SYRINGE) ×2 IMPLANT
TRACTIP FLEXIVA PULSE ID 200 (Laser) ×2 IMPLANT
VALVE UROSEAL ADJ ENDO (VALVE) IMPLANT
WATER STERILE IRR 1000ML POUR (IV SOLUTION) ×2 IMPLANT

## 2020-08-27 NOTE — H&P (Signed)
08/27/20 9:06 AM   Austin Sullivan Apr 29, 1955 [id number]  CC: Bilateral nephrolithiasis, history of sepsis from urinary source  HPI: 65 year old male with history of recent knee transplant who presented 1 month ago with sepsis from urinary source and a left proximal ureteral stone, as well as right renal stones and underwent urgent bilateral ureteral stent placement.  He presents today for definitive management with ureteroscopy.  Was recently started on nitrofurantoin for 2k colonies Staph epidermidis in the urine.  Original urine culture grew E. coli   PMH: Past Medical History:  Diagnosis Date  . Arthritis   . Basal cell carcinoma 01/30/2019   Left ear, superior helix. Nodular and infiltrative patterns. Simple excision/EDC.  . Basal cell carcinoma 11/03/2019   left lat brow  . BPH (benign prostatic hyperplasia)   . Chemotherapy management, encounter for    FOR hodgkins lymphoma. in remission now  . COPD (chronic obstructive pulmonary disease) ([place])   . Diabetes mellitus without complication ([place])   . Dyspnea    WITH EXCERCISE  . History of kidney stones   . Hodgkin's lymphoma ([place])    chemotherapy. now in remission  . Hypertension   . Irritable bowel syndrome with constipation   . Melanoma ([place]) 2001   Right forehead. Metastatic melanoma 2016  . Peripheral neuropathy   . Sepsis ([place]) 07/2020   developed stones after TKR and was readmit to hospital for this  . Sleep apnea    USES CPAP  . Vocal cord paralysis    wedge placed on cords so he can speak    Surgical History: Past Surgical History:  Procedure Laterality Date  . bph    . CYSTOSCOPY WITH STENT PLACEMENT Bilateral 07/26/2020   Procedure: CYSTOSCOPY WITH STENT PLACEMENT;  Surgeon: Billey Co, MD;  Location: [facility] ORS;  Service: Urology;  Laterality: Bilateral;  . EYE SURGERY     carterat surgery x 2  . FRACTURE SURGERY     both wrist ahs metal plates  . JOINT REPLACEMENT     left hip, LEFT  KNEE  .  ORCHIECTOMY     only 1 testicle removed d/t lump on that side  . TOTAL HIP ARTHROPLASTY Left   . TOTAL KNEE ARTHROPLASTY Left 07/20/2020   Procedure: TOTAL KNEE ARTHROPLASTY - [name] to Assist;  Surgeon: Hessie Knows, MD;  Location: [facility] ORS;  Service: Orthopedics;  Laterality: Left;  . trachostomy    . WRIST SURGERY Bilateral    PLATES AND SCREWS    Family History: History reviewed. No pertinent family history.  Social History:  reports that he quit smoking about 44 years ago. His smoking use included cigarettes. He has quit using smokeless tobacco. He reports current alcohol use of about 7.0 standard drinks of alcohol per week. He reports previous drug use.  Physical Exam: BP (!) 148/82   Pulse 88   Temp (!) 97.2 F (36.2 C) (Temporal)   Resp 20   Ht 6\' 2"  (1.88 m)   Wt 114.8 kg   SpO2 97%   BMI 32.48 kg/m    Constitutional:  Alert and oriented, No acute distress. Cardiovascular: RRR Respiratory: CTA bilaterally GI: Abdomen is soft, nontender, nondistended, no abdominal masses  Laboratory Data: Culture data reviewed, original culture E. coli on 07/26/2020, most recent culture 2k Staph epidermidis, was started on nitrofurantoin to sterilize the urine  Assessment & Plan:   65 year old male who previously presented with sepsis from urinary source with a left proximal ureteral stone and  right renal stones and previously underwent emergent bilateral ureteral stent placement.  Presents today for definitive ureteroscopy, laser lithotripsy, and stent placement.  We specifically discussed the risks ureteroscopy including bleeding, infection/sepsis, stent related symptoms including flank pain/urgency/frequency/incontinence/dysuria, ureteral injury, inability to access stone, or need for staged or additional procedures.  Bilateral ureteroscopy, laser lithotripsy, stent placement today  Nickolas Madrid, MD 08/27/2020  [place] [address],  [place], [place] [address] [phone]

## 2020-08-27 NOTE — Op Note (Signed)
Date of procedure: 08/27/20  Preoperative diagnosis:  1. Left renal stones 2. Right renal stones  Postoperative diagnosis:  1. Left renal stones 2. Right renal stones  Procedure: 1. Cystoscopy 2. Left ureteroscopy, laser lithotripsy, left retrograde pyelogram with intraoperative interpretation, left ureteral stent placement 3. Right ureteroscopy, laser lithotripsy, right retrograde pyelogram with intraoperative interpretation, right ureteral stent placement  Surgeon: Nickolas Madrid, MD  Anesthesia: General  Complications: None  Intraoperative findings:  1.  Uncomplicated dusting of all renal stones bilaterally.  Left proximal ureteral stone had been pushed back into the left lower pole after original stent placement 2.  Small prostate, mild bladder trabeculations, no suspicious lesions, ureteral orifices orthotopic bilaterally  EBL: Minimal  Specimens: None  Drains: Left 6 French by 28 cm ureteral stent, right 6 [place] by 26 cm ureteral stent  Indication: Austin Sullivan is a 65 y.o. patient who previously presented with sepsis from urinary source and a left proximal ureteral stone and right renal stones, and underwent emergent bilateral ureteral stent placement.  After reviewing the management options for treatment, they elected to proceed with the above surgical procedure(s). We have discussed the potential benefits and risks of the procedure, side effects of the proposed treatment, the likelihood of the patient achieving the goals of the procedure, and any potential problems that might occur during the procedure or recuperation. Informed consent has been obtained.  Description of procedure:  The patient was taken to the operating room and general anesthesia was induced. SCDs were placed for DVT prophylaxis. The patient was placed in the dorsal lithotomy position, prepped and draped in the usual sterile fashion, and preoperative antibiotics(Cipro and gentamicin) were administered. A  preoperative time-out was performed.   A 21 French rigid cystoscope was used to intubate the urethra and a normal-appearing urethra was followed proximally into the bladder.  The prostate was small.  Thorough cystoscopy was performed and there were mild to moderate trabeculations, but no suspicious lesions.  A sensor wire was passed alongside the left ureteral stent up to the kidney under fluoroscopic vision.  The left ureteral stent was grasped and pulled to the meatus, and a second safety wire was added.  The single channel digital flexible ureteroscope advanced easily over the wire up into the kidney.  Thorough pyeloscopy revealed a 6 mm stone in the upper pole, and a 1 cm stone in the lower pole.  These were methodically fragmented to dust using the 242 m laser fiber on settings of 0.5 J and 40 Hz.  Thorough pyeloscopy revealed no residual stones at the conclusion of fragmentation.  A retrograde pyelogram was performed from the proximal ureter and showed no extravasation or filling defects.  Careful pullback ureteroscopy showed no abnormalities.  The rigid cystoscope was backloaded over the wire and a 6 [place] by 28 cm ureteral stent was uneventfully placed on the left.  I turned my attention to the right side and a sensor wire was advanced alongside the indwelling stent up to the kidney.  The stent was then grasped and pulled to the meatus, and a second safety sensor wire was advanced through the stent into the kidney and the old stent removed.  The single channel digital flexible ureteroscope advanced easily over the wire up to the kidney and I identified 2 large 8 mm stones in the renal pelvis.  These were fragmented to dust on previously mentioned settings.  Thorough pyeloscopy revealed no residual stones.  A retrograde pyelogram was performed from the proximal ureter and  showed no extravasation or filling defects.  Careful pullback ureteroscopy showed no abnormalities.  The rigid cystoscope was  backloaded over the wire, and a 6 [place] by 26 cm ureteral stent was uneventfully placed with a shepherd's hook in the midpole, and an excellent curl in the bladder.  The bladder was drained, a belladonna suppository was placed, and this concluded our procedure.  Disposition: Stable to PACU  Plan: Remove stents in clinic in 1 to 2 weeks  Nickolas Madrid, MD

## 2020-08-27 NOTE — Anesthesia Postprocedure Evaluation (Signed)
Anesthesia Post Note  Patient: Austin Sullivan  Procedure(s) Performed: CYSTOSCOPY/URETEROSCOPY/HOLMIUM LASER/STENT PLACEMENT (Bilateral )  Patient location during evaluation: PACU Anesthesia Type: General Level of consciousness: awake and alert Pain management: pain level controlled Vital Signs Assessment: post-procedure vital signs reviewed and stable Respiratory status: spontaneous breathing, nonlabored ventilation, respiratory function stable and patient connected to nasal cannula oxygen Cardiovascular status: blood pressure returned to baseline and stable Postop Assessment: no apparent nausea or vomiting Anesthetic complications: no   No complications documented.   Last Vitals:  Vitals:   08/27/20 1121 08/27/20 1133  BP:  139/85  Pulse: 84 91  Resp: 11 17  Temp: (!) 36.2 C (!) 36.1 C  SpO2: 95% 95%    Last Pain:  Vitals:   08/27/20 1133  TempSrc: Temporal  PainSc: 0-No pain                 Precious Haws Alizzon Dioguardi

## 2020-08-27 NOTE — Anesthesia Preprocedure Evaluation (Signed)
Anesthesia Evaluation  Patient identified by MRN, date of birth, ID band Patient awake    Reviewed: Allergy & Precautions, NPO status , Patient's Chart, lab work & pertinent test results  History of Anesthesia Complications Negative for: history of anesthetic complications  Airway Mallampati: III       Dental   Pulmonary sleep apnea and Continuous Positive Airway Pressure Ventilation , COPD,  COPD inhaler, Not current smoker, former smoker,           Cardiovascular hypertension, Pt. on medications (-) Past MI and (-) CHF (-) dysrhythmias (-) Valvular Problems/Murmurs     Neuro/Psych neg Seizures    GI/Hepatic Neg liver ROS, neg GERD  ,  Endo/Other  diabetes, Type 2, Oral Hypoglycemic Agents  Renal/GU Renal disease (stones)     Musculoskeletal   Abdominal   Peds  Hematology   Anesthesia Other Findings   Reproductive/Obstetrics                             Anesthesia Physical Anesthesia Plan  ASA: III  Anesthesia Plan: General   Post-op Pain Management:    Induction: Intravenous  PONV Risk Score and Plan: 2 and Ondansetron and Dexamethasone  Airway Management Planned: Oral ETT  Additional Equipment:   Intra-op Plan:   Post-operative Plan:   Informed Consent: I have reviewed the patients History and Physical, chart, labs and discussed the procedure including the risks, benefits and alternatives for the proposed anesthesia with the patient or authorized representative who has indicated his/her understanding and acceptance.       Plan Discussed with:   Anesthesia Plan Comments:         Anesthesia Quick Evaluation

## 2020-08-27 NOTE — Transfer of Care (Signed)
Immediate Anesthesia Transfer of Care Note  Patient: Riki Sheer  Procedure(s) Performed: CYSTOSCOPY/URETEROSCOPY/HOLMIUM LASER/STENT PLACEMENT (Bilateral )  Patient Location: PACU  Anesthesia Type:General  Level of Consciousness: awake and alert   Airway & Oxygen Therapy: Patient Spontanous Breathing and Patient connected to face mask oxygen  Post-op Assessment: Report given to RN and Post -op Vital signs reviewed and stable  Post vital signs: Reviewed  Last Vitals:  Vitals Value Taken Time  BP    Temp    Pulse 84 08/27/20 1050  Resp 11 08/27/20 1050  SpO2 100 % 08/27/20 1050  Vitals shown include unvalidated device data.  Last Pain:  Vitals:   08/27/20 0713  TempSrc: Temporal  PainSc: 0-No pain      Patients Stated Pain Goal: 0 (41/93/79 0240)  Complications: No complications documented.

## 2020-08-27 NOTE — Anesthesia Procedure Notes (Signed)
Procedure Name: Intubation Date/Time: 08/27/2020 9:37 AM Performed by: Lowry Bowl, CRNA Pre-anesthesia Checklist: Patient identified, Emergency Drugs available, Suction available and Patient being monitored Patient Re-evaluated:Patient Re-evaluated prior to induction Oxygen Delivery Method: Circle system utilized Preoxygenation: Pre-oxygenation with 100% oxygen Induction Type: IV induction and Cricoid Pressure applied Ventilation: Mask ventilation without difficulty Laryngoscope Size: McGraph and 4 Grade View: Grade I Tube type: Oral Tube size: 7.5 mm Number of attempts: 1 Airway Equipment and Method: Stylet and Video-laryngoscopy Placement Confirmation: ETT inserted through vocal cords under direct vision,  positive ETCO2 and breath sounds checked- equal and bilateral Secured at: 23 cm Tube secured with: Tape Dental Injury: Teeth and Oropharynx as per pre-operative assessment

## 2020-08-27 NOTE — Discharge Instructions (Signed)

## 2020-08-31 ENCOUNTER — Telehealth: Payer: Self-pay

## 2020-08-31 MED ORDER — OXYBUTYNIN CHLORIDE ER 10 MG PO TB24: 10.0000 mg | ORAL_TABLET | Freq: Every day | ORAL | 0 refills | Status: DC

## 2020-08-31 NOTE — Telephone Encounter (Signed)
Patient called post URS w/stent placement on 08/27/20. He states he is having a lot of frequency and urgency and would like to know if this is normal. He also describes bladder spasms. It was explained that this can happen post op and with a stent in place. He denies fever, chills, some nausea noted. Ok to send in a  script for Tamsulosin and Oxybutinin be sent in to help with stent discomfort? Ok to utilize OTC NSAIDs as well? Please advise

## 2020-08-31 NOTE — Telephone Encounter (Signed)
Patient notified. He states he saw oncology this morning after having a CTscan done and was told that he had air in his bladder. He states that they seemed concerned by this and said that they would contact our office to notify us. I do not see a message sent as of yet and wanted to make sure you were aware

## 2020-08-31 NOTE — Telephone Encounter (Signed)
He is already on alfuzosin so does not need the Flomax, but he can take NSAIDs and okay to send in oxybutynin 10 mg XL daily for 7 days while stents are still in place  Nickolas Madrid, MD 08/31/2020

## 2020-08-31 NOTE — Telephone Encounter (Signed)
Called patient and left a message to return call. Script sent into pharmacy

## 2020-09-08 ENCOUNTER — Encounter: Payer: Self-pay | Admitting: Urology

## 2020-09-08 ENCOUNTER — Other Ambulatory Visit: Payer: Self-pay

## 2020-09-08 ENCOUNTER — Ambulatory Visit (INDEPENDENT_AMBULATORY_CARE_PROVIDER_SITE_OTHER): Payer: Medicare Other | Admitting: Urology

## 2020-09-08 VITALS — BP 160/74 | HR 103 | Ht 74.0 in | Wt 252.0 lb

## 2020-09-08 DIAGNOSIS — N201 Calculus of ureter: Secondary | ICD-10-CM | POA: Diagnosis not present

## 2020-09-08 DIAGNOSIS — Z125 Encounter for screening for malignant neoplasm of prostate: Secondary | ICD-10-CM

## 2020-09-08 DIAGNOSIS — N400 Enlarged prostate without lower urinary tract symptoms: Secondary | ICD-10-CM

## 2020-09-08 LAB — URINALYSIS, COMPLETE
Bilirubin, UA: NEGATIVE
Glucose, UA: NEGATIVE
Ketones, UA: NEGATIVE
Nitrite, UA: NEGATIVE
Specific Gravity, UA: 1.015 (ref 1.005–1.030)
Urobilinogen, Ur: 0.2 mg/dL (ref 0.2–1.0)
pH, UA: 6 (ref 5.0–7.5)

## 2020-09-08 LAB — MICROSCOPIC EXAMINATION
Bacteria, UA: NONE SEEN
RBC, Urine: >30 /hpf — ABNORMAL HIGH (ref 0–2)

## 2020-09-08 MED ORDER — NITROFURANTOIN MONOHYD MACRO 100 MG PO CAPS
100.0000 mg | ORAL_CAPSULE | Freq: Once | ORAL | Status: AC
  Administered 2020-09-08: 100 mg via ORAL

## 2020-09-08 NOTE — Progress Notes (Signed)
Cystoscopy Procedure Note:  Indication: Stent removal s/p bilateral ureteroscopy, laser lithotripsy, stent placement on 08/27/2020  After informed consent and discussion of the procedure and its risks, Austin Sullivan was positioned and prepped in the standard fashion. Cystoscopy was performed with a flexible cystoscope. The left stent was grasped with flexible graspers and removed in its entirety.   The cystoscope was reinserted into the bladder and the right ureteral stent was grasped and removed in its entirety.  The patient tolerated the procedure well.  Findings: Uncomplicated stent removal  Assessment and Plan: We discussed general stone prevention strategies including adequate hydration with goal of producing 2.5 L of urine daily, increasing citric acid intake, increasing calcium intake during high oxalate meals, minimizing animal protein, and decreasing salt intake. Information about dietary recommendations given today.   RTC 6 months with KUB and PSA prior   Billey Co, MD 09/08/2020

## 2020-09-08 NOTE — Patient Instructions (Signed)

## 2020-11-17 ENCOUNTER — Other Ambulatory Visit: Payer: Self-pay

## 2020-11-17 ENCOUNTER — Ambulatory Visit (INDEPENDENT_AMBULATORY_CARE_PROVIDER_SITE_OTHER): Payer: Medicare Other | Admitting: Dermatology

## 2020-11-17 DIAGNOSIS — D229 Melanocytic nevi, unspecified: Secondary | ICD-10-CM

## 2020-11-17 DIAGNOSIS — Z1283 Encounter for screening for malignant neoplasm of skin: Secondary | ICD-10-CM | POA: Diagnosis not present

## 2020-11-17 DIAGNOSIS — L814 Other melanin hyperpigmentation: Secondary | ICD-10-CM

## 2020-11-17 DIAGNOSIS — D1800 Hemangioma unspecified site: Secondary | ICD-10-CM

## 2020-11-17 DIAGNOSIS — Z85820 Personal history of malignant melanoma of skin: Secondary | ICD-10-CM | POA: Diagnosis not present

## 2020-11-17 DIAGNOSIS — L578 Other skin changes due to chronic exposure to nonionizing radiation: Secondary | ICD-10-CM

## 2020-11-17 DIAGNOSIS — Z85828 Personal history of other malignant neoplasm of skin: Secondary | ICD-10-CM | POA: Diagnosis not present

## 2020-11-17 DIAGNOSIS — L570 Actinic keratosis: Secondary | ICD-10-CM | POA: Diagnosis not present

## 2020-11-17 DIAGNOSIS — L821 Other seborrheic keratosis: Secondary | ICD-10-CM

## 2020-11-17 DIAGNOSIS — I781 Nevus, non-neoplastic: Secondary | ICD-10-CM

## 2020-11-17 NOTE — Progress Notes (Signed)
Follow-Up Visit   Subjective  Austin Sullivan is a 65 y.o. male who presents for the following: Annual Exam (HX MM, BCC ). The patient presents for Total-Body Skin Exam (TBSE) for skin cancer screening and mole check.  The following portions of the chart were reviewed this encounter and updated as appropriate:   Tobacco  Allergies  Meds  Problems  Med Hx  Surg Hx  Fam Hx     Review of Systems:  No other skin or systemic complaints except as noted in HPI or Assessment and Plan.  Objective  Well appearing patient in no apparent distress; mood and affect are within normal limits.  A full examination was performed including scalp, head, eyes, ears, nose, lips, neck, chest, axillae, abdomen, back, buttocks, bilateral upper extremities, bilateral lower extremities, hands, feet, fingers, toes, fingernails, and toenails. All findings within normal limits unless otherwise noted below.  Forehead x 2 (2) Erythematous thin papules/macules with gritty scale.   Nose Dilated blood vessel.   Assessment & Plan  AK (actinic keratosis) (2) Forehead x 2  Destruction of lesion - Forehead x 2 Complexity: simple   Destruction method: cryotherapy   Informed consent: discussed and consent obtained   Timeout:  patient name, date of birth, surgical site, and procedure verified Lesion destroyed using liquid nitrogen: Yes   Region frozen until ice ball extended beyond lesion: Yes   Outcome: patient tolerated procedure well with no complications   Post-procedure details: wound care instructions given    Telangiectasia Nose Consider BBL laser Benign-appearing.  Observation.  Call clinic for new or changing lesions.  Recommend daily use of broad spectrum spf 30+ sunscreen to sun-exposed areas.   Skin cancer screening  Lentigines - Scattered tan macules - Due to sun exposure - Benign-appering, observe - Recommend daily broad spectrum sunscreen SPF 30+ to sun-exposed areas, reapply every 2  hours as needed. - Call for any changes  Seborrheic Keratoses - Stuck-on, waxy, tan-brown papules and/or plaques  - Benign-appearing - Discussed benign etiology and prognosis. - Observe - Call for any changes  Melanocytic Nevi - Tan-brown and/or pink-flesh-colored symmetric macules and papules - Benign appearing on exam today - Observation - Call clinic for new or changing moles - Recommend daily use of broad spectrum spf 30+ sunscreen to sun-exposed areas.   Hemangiomas - Red papules - Discussed benign nature - Observe - Call for any changes  Actinic Damage - Chronic condition, secondary to cumulative UV/sun exposure - diffuse scaly erythematous macules with underlying dyspigmentation - Recommend daily broad spectrum sunscreen SPF 30+ to sun-exposed areas, reapply every 2 hours as needed.  - Staying in the shade or wearing long sleeves, sun glasses (UVA+UVB protection) and wide brim hats (4-inch brim around the entire circumference of the hat) are also recommended for sun protection.  - Call for new or changing lesions.  History of Melanoma - No evidence of recurrence today - No lymphadenopathy - Recommend regular full body skin exams - Recommend daily broad spectrum sunscreen SPF 30+ to sun-exposed areas, reapply every 2 hours as needed.  - Call if any new or changing lesions are noted between office visits  History of Basal Cell Carcinoma of the Skin - No evidence of recurrence today - Recommend regular full body skin exams - Recommend daily broad spectrum sunscreen SPF 30+ to sun-exposed areas, reapply every 2 hours as needed.  - Call if any new or changing lesions are noted between office visits  Skin cancer screening performed today.  Return in about 6 months (around 05/20/2021) for TBSE - hx MM (metastatic), BCC .  Luther Redo, CMA, am acting as scribe for [name], MD . Documentation: I have reviewed the above documentation for accuracy and  completeness, and I agree with the above.  [name], MD

## 2020-11-17 NOTE — Patient Instructions (Signed)

## 2020-11-21 ENCOUNTER — Encounter: Payer: Self-pay | Admitting: Dermatology

## 2021-03-10 ENCOUNTER — Other Ambulatory Visit: Payer: Medicare Other

## 2021-03-10 ENCOUNTER — Other Ambulatory Visit: Payer: Self-pay

## 2021-03-10 DIAGNOSIS — N400 Enlarged prostate without lower urinary tract symptoms: Secondary | ICD-10-CM

## 2021-03-11 LAB — PSA: Prostate Specific Ag, Serum: 0.2 ng/mL (ref 0.0–4.0)

## 2021-03-16 ENCOUNTER — Other Ambulatory Visit: Payer: Self-pay

## 2021-03-16 ENCOUNTER — Ambulatory Visit: Payer: Medicare Other | Admitting: Urology

## 2021-03-16 ENCOUNTER — Ambulatory Visit
Admission: RE | Admit: 2021-03-16 | Discharge: 2021-03-16 | Disposition: A | Discharge status: Home / Self Care | Payer: Medicare Other | Source: Ambulatory Visit | Attending: Urology | Admitting: Urology

## 2021-03-16 ENCOUNTER — Ambulatory Visit (INDEPENDENT_AMBULATORY_CARE_PROVIDER_SITE_OTHER): Payer: Medicare Other | Admitting: Urology

## 2021-03-16 ENCOUNTER — Encounter: Payer: Self-pay | Admitting: Urology

## 2021-03-16 VITALS — BP 159/68 | HR 93 | Ht 73.0 in | Wt 252.0 lb

## 2021-03-16 DIAGNOSIS — N400 Enlarged prostate without lower urinary tract symptoms: Secondary | ICD-10-CM | POA: Diagnosis not present

## 2021-03-16 DIAGNOSIS — N201 Calculus of ureter: Secondary | ICD-10-CM

## 2021-03-16 DIAGNOSIS — N529 Male erectile dysfunction, unspecified: Secondary | ICD-10-CM

## 2021-03-16 DIAGNOSIS — Z125 Encounter for screening for malignant neoplasm of prostate: Secondary | ICD-10-CM | POA: Diagnosis not present

## 2021-03-16 DIAGNOSIS — N200 Calculus of kidney: Secondary | ICD-10-CM

## 2021-03-16 MED ORDER — TADALAFIL 10 MG PO TABS: 10.0000 mg | ORAL_TABLET | Freq: Every day | ORAL | 11 refills | Status: DC | PRN

## 2021-03-16 NOTE — Progress Notes (Signed)
03/16/2021 °11:39 AM  ° °Austin Sullivan °01/14/1956 °[id number] ° °Reason for visit: Follow up nephrolithiasis, BPH, PSA screening, new ED ° °HPI: °65-year-old male who I originally met in April 2022 when he presented with sepsis from urinary source and underwent bilateral ureteral stent placement for a left proximal ureteral stone and right UPJ stone.  He ultimately underwent definitive management with bilateral ureteroscopy in May 2022, and stents were removed 1 week later. ° °He has BPH that has been managed by an outside urologist long-term with maximal medical therapy on alfuzosin and dutasteride.  Urinary symptoms are stable, and prostate was short and open appearing on recent cystoscopy. ° °Recent PSA on 03/10/2021 was 0.2(0.4 corrected for dutasteride) and is well within the normal range. ° °He denies any problems with urination, gross hematuria, or flank pain since our last visit. ° °I personally viewed and interpreted a recent CT abdomen pelvis from the [facility] system on 03/08/2021 performed for history of melanoma which shows no hydronephrosis or evidence of stone disease.  I also reviewed the KUB today that shows no evidence of kidney stones. ° °We discussed general stone prevention strategies including adequate hydration with goal of producing 2.5 L of urine daily, increasing citric acid intake, increasing calcium intake during high oxalate meals, minimizing animal protein, and decreasing salt intake. Information about dietary recommendations given today.  ° °Finally, he had questions about erectile dysfunction today.  It sounds like he was previously on a PDE 5 inhibitor from an outside urologist, and he has had some benefit from that previously.  He was unsure if that was Viagra or Cialis.  We discussed the risks and benefits and pros/cons of Viagra or Cialis, and he is interested in a trial of Cialis 10 to 20 mg on demand. ° °Trial of Cialis 10 to 20 mg on demand for ED, we discussed this could also be  taken daily °Continue alfuzosin and finasteride for BPH °RTC 1 year PSA prior, PVR, KUB ° ° °Brian C Sninsky, MD ° °[facility] °[address], [address] °[place], [place] [address] °[phone] ° ° °

## 2021-03-16 NOTE — Patient Instructions (Signed)
Dietary Guidelines to Help Prevent Kidney Stones Kidney stones are deposits of minerals and salts that form inside your kidneys. Your risk of developing kidney stones may be greater depending on your diet, your lifestyle, the medicines you take, and whether you have certain medical conditions. Most people can lower their chances of developing kidney stones by following the instructions below. Your dietitian may give you more specific instructions depending on your overall health and the type of kidney stones you tend to develop. What are tips for following this plan? Reading food labels  Choose foods with "no salt added" or "low-salt" labels. Limit your salt (sodium) intake to less than 1,500 mg a day. Choose foods with calcium for each meal and snack. Try to eat about 300 mg of calcium at each meal. Foods that contain 200-500 mg of calcium a serving include: 8 oz (237 mL) of milk, calcium-fortifiednon-dairy milk, and calcium-fortifiedfruit juice. Calcium-fortified means that calcium has been added to these drinks. 8 oz (237 mL) of kefir, yogurt, and soy yogurt. 4 oz (114 g) of tofu. 1 oz (28 g) of cheese. 1 cup (150 g) of dried figs. 1 cup (91 g) of cooked broccoli. One 3 oz (85 g) can of sardines or mackerel. Most people need 1,000-1,500 mg of calcium a day. Talk to your dietitian about how much calcium is recommended for you. Shopping Buy plenty of fresh fruits and vegetables. Most people do not need to avoid fruits and vegetables, even if these foods contain nutrients that may contribute to kidney stones. When shopping for convenience foods, choose: Whole pieces of fruit. Pre-made salads with dressing on the side. Low-fat fruit and yogurt smoothies. Avoid buying frozen meals or prepared deli foods. These can be high in sodium. Look for foods with live cultures, such as yogurt and kefir. Choose high-fiber grains, such as whole-wheat breads, oat bran, and wheat cereals. Cooking Do not add  salt to food when cooking. Place a salt shaker on the table and allow each person to add his or her own salt to taste. Use vegetable protein, such as beans, textured vegetable protein (TVP), or tofu, instead of meat in pasta, casseroles, and soups. Meal planning Eat less salt, if told by your dietitian. To do this: Avoid eating processed or pre-made food. Avoid eating fast food. Eat less animal protein, including cheese, meat, poultry, or fish, if told by your dietitian. To do this: Limit the number of times you have meat, poultry, fish, or cheese each week. Eat a diet free of meat at least 2 days a week. Eat only one serving each day of meat, poultry, fish, or seafood. When you prepare animal protein, cut pieces into small portion sizes. For most meat and fish, one serving is about the size of the palm of your hand. Eat at least five servings of fresh fruits and vegetables each day. To do this: Keep fruits and vegetables on hand for snacks. Eat one piece of fruit or a handful of berries with breakfast. Have a salad and fruit at lunch. Have two kinds of vegetables at dinner. Limit foods that are high in a substance called oxalate. These include: Spinach (cooked), rhubarb, beets, sweet potatoes, and Swiss chard. Peanuts. Potato chips, french fries, and baked potatoes with skin on. Nuts and nut products. Chocolate. If you regularly take a diuretic medicine, make sure to eat at least 1 or 2 servings of fruits or vegetables that are high in potassium each day. These include: Avocado. Banana. Orange, prune,  carrot, or tomato juice. Baked potato. Cabbage. Beans and split peas. Lifestyle  Drink enough fluid to keep your urine pale yellow. This is the most important thing you can do. Spread your fluid intake throughout the day. If you drink alcohol: Limit how much you use to: 0-1 drink a day for women who are not pregnant. 0-2 drinks a day for men. Be aware of how much alcohol is in your  drink. In the [place] one drink equals one 12 oz bottle of beer (355 mL), one 5 oz glass of wine (148 mL), or one 1 oz glass of hard liquor (44 mL). Lose weight if told by your health care provider. Work with your dietitian to find an eating plan and weight loss strategies that work best for you. General information Talk to your health care provider and dietitian about taking daily supplements. You may be told the following depending on your health and the cause of your kidney stones: Not to take supplements with vitamin C. To take a calcium supplement. To take a daily probiotic supplement. To take other supplements such as magnesium, fish oil, or vitamin B6. Take over-the-counter and prescription medicines only as told by your health care provider. These include supplements. What foods should I limit? Limit your intake of the following foods, or eat them as told by your dietitian. Vegetables Spinach. Rhubarb. Beets. Canned vegetables. Angie Fava. Olives. Baked potatoes with skin. Grains Wheat bran. Baked goods. Salted crackers. Cereals high in sugar. Meats and other proteins Nuts. Nut butters. Large portions of meat, poultry, or fish. Salted, precooked, or cured meats, such as sausages, meat loaves, and hot dogs. Dairy Cheese. Beverages Regular soft drinks. Regular vegetable juice. Seasonings and condiments Seasoning blends with salt. Salad dressings. Soy sauce. Ketchup. Barbecue sauce. Other foods Canned soups. Canned pasta sauce. Casseroles. Pizza. Lasagna. Frozen meals. Potato chips. [place] fries. The items listed above may not be a complete list of foods and beverages you should limit. Contact a dietitian for more information. What foods should I avoid? Talk to your dietitian about specific foods you should avoid based on the type of kidney stones you have and your overall health. Fruits Grapefruit. The item listed above may not be a complete list of foods and beverages you should  avoid. Contact a dietitian for more information. Summary Kidney stones are deposits of minerals and salts that form inside your kidneys. You can lower your risk of kidney stones by making changes to your diet. The most important thing you can do is drink enough fluid. Drink enough fluid to keep your urine pale yellow. Talk to your dietitian about how much calcium you should have each day, and eat less salt and animal protein as told by your dietitian. This information is not intended to replace advice given to you by your health care provider. Make sure you discuss any questions you have with your health care provider. Document Revised: 03/13/2019 Document Reviewed: 03/13/2019 Elsevier Patient Education  2022 [employer].  Prostate Cancer Screening Prostate cancer screening is testing that is done to check for the presence of prostate cancer in men. The prostate gland is a walnut-sized gland that is located below the bladder and in front of the rectum in males. The function of the prostate is to add fluid to semen during ejaculation. Prostate cancer is one of the most common types of cancer in men. Who should have prostate cancer screening? Screening recommendations vary based on age and other risk factors, as well as  between the professional organizations who make the recommendations. In general, screening is recommended if: You are age 13 to 56 and have an average risk for prostate cancer. You should talk with your health care provider about your need for screening and how often screening should be done. Because most prostate cancers are slow growing and will not cause death, screening in this age group is generally reserved for men who have a 30- to 15-year life expectancy. You are younger than age 25, and you have these risk factors: Having a father, brother, or uncle who has been diagnosed with prostate cancer. The risk is higher if your family member's cancer occurred at an early age or if  you have multiple family members with prostate cancer at an early age. Being a male who is [place] or is of [place] or sub-Saharan African descent. In general, screening is not recommended if: You are younger than age 73. You are between the ages of 56 and 10 and you have no risk factors. You are 102 years of age or older. At this age, the risks that screening can cause are greater than the benefits that it may provide. If you are at high risk for prostate cancer, your health care provider may recommend that you have screenings more often or that you start screening at a younger age. How is screening for prostate cancer done? The recommended prostate cancer screening test is a blood test called the prostate-specific antigen (PSA) test. PSA is a protein that is made in the prostate. As you age, your prostate naturally produces more PSA. Abnormally high PSA levels may be caused by: Prostate cancer. An enlarged prostate that is not caused by cancer (benign prostatic hyperplasia, or BPH). This condition is very common in older men. A prostate gland infection (prostatitis) or urinary tract infection. Certain medicines such as male hormones (like testosterone) or other medicines that raise testosterone levels. A rectal exam may be done as part of prostate cancer screening to help provide information about the size of your prostate gland. When a rectal exam is performed, it should be done after the PSA level is drawn to avoid any effect on the results. Depending on the PSA results, you may need more tests, such as: A physical exam to check the size of your prostate gland, if not done as part of screening. Blood and imaging tests. A procedure to remove tissue samples from your prostate gland for testing (biopsy). This is the only way to know for certain if you have prostate cancer. What are the benefits of prostate cancer screening? Screening can help to identify cancer at an early stage, before symptoms  start and when the cancer can be treated more easily. There is a small chance that screening may lower your risk of dying from prostate cancer. The chance is small because prostate cancer is a slow-growing cancer, and most men with prostate cancer die from a different cause. What are the risks of prostate cancer screening? The main risk of prostate cancer screening is diagnosing and treating prostate cancer that would never have caused any symptoms or problems. This is called overdiagnosisand overtreatment. PSA screening cannot tell you if your PSA is high due to cancer or a different cause. A prostate biopsy is the only procedure to diagnose prostate cancer. Even the results of a biopsy may not tell you if your cancer needs to be treated. Slow-growing prostate cancer may not need any treatment other than monitoring, so diagnosing and treating it  may cause unnecessary stress or other side effects. Questions to ask your health care provider When should I start prostate cancer screening? What is my risk for prostate cancer? How often do I need screening? What type of screening tests do I need? How do I get my test results? What do my results mean? Do I need treatment? Where to find more information The American Cancer Society: www.cancer.org American Urological Association: www.auanet.org Contact a health care provider if: You have difficulty urinating. You have pain when you urinate or ejaculate. You have blood in your urine or semen. You have pain in your back or in the area of your prostate. Summary Prostate cancer is a common type of cancer in men. The prostate gland is located below the bladder and in front of the rectum. This gland adds fluid to semen during ejaculation. Prostate cancer screening may identify cancer at an early stage, when the cancer can be treated more easily and is less likely to have spread to other areas of the body. The prostate-specific antigen (PSA) test is the  recommended screening test for prostate cancer, but it has associated risks. Discuss the risks and benefits of prostate cancer screening with your health care provider. If you are age 61 or older, the risks that screening can cause are greater than the benefits that it may provide. This information is not intended to replace advice given to you by your health care provider. Make sure you discuss any questions you have with your health care provider. Document Revised: 09/13/2020 Document Reviewed: 09/13/2020 Elsevier Patient Education  [place].  Tadalafil Tablets (Erectile Dysfunction, BPH) What is this medication? TADALAFIL (tah DA la fil) treats erectile dysfunction (ED). It works by increasing blood flow to the penis, which helps to maintain an erection. It may also be used to treat symptoms of an enlarged prostate (benign prostatic hyperplasia). This medicine may be used for other purposes; ask your health care provider or pharmacist if you have questions. COMMON BRAND NAME(S): Kathaleen Bury, Cialis What should I tell my care team before I take this medication? They need to know if you have any of these conditions: Anatomical deformation of the penis, Peyronie's disease, or history of priapism (painful and prolonged erection) Bleeding disorders Eye or vision problems, including a rare inherited eye disease called retinitis pigmentosa Heart disease, angina, a history of heart attack, irregular heart beats, or other heart problems High or low blood pressure History of blood diseases, like sickle cell anemia or leukemia History of stomach bleeding Kidney disease Liver disease Stroke An unusual or allergic reaction to tadalafil, other medications, foods, dyes, or preservatives Pregnant or trying to get pregnant Breast-feeding How should I use this medication? Take this medication by mouth with a glass of water. Follow the directions on the prescription label. You may take this  medication with or without meals. When this medication is used for erection problems, your care team may prescribe it to be taken once daily or as needed. If you are taking the medication as needed, you may be able to have sexual activity 30 minutes after taking it and for up to 36 hours after taking it. Whether you are taking the medication as needed or once daily, you should not take more than one dose per day. If you are taking this medication for symptoms of benign prostatic hyperplasia (BPH) or to treat both BPH and an erection problem, take the dose once daily at about the same time each day. Do  not take your medication more often than directed. Talk to your care team about the use of this medication in children. Special care may be needed. Overdosage: If you think you have taken too much of this medicine contact a poison control center or emergency room at once. NOTE: This medicine is only for you. Do not share this medicine with others. What if I miss a dose? If you are taking this medication as needed for erection problems, this does not apply. If you miss a dose while taking this medication once daily for an erection problem, benign prostatic hyperplasia, or both, take it as soon as you remember, but do not take more than one dose per day. What may interact with this medication? Do not take this medication with any of the following: Nitrates like amyl nitrite, isosorbide dinitrate, isosorbide mononitrate, nitroglycerin Other medications for erectile dysfunction like avanafil, sildenafil, vardenafil Other tadalafil products (Adcirca) Riociguat This medication may also interact with the following: Certain medications for high blood pressure Certain medications for the treatment of HIV infection or AIDS Certain medications used for fungal or yeast infections, like fluconazole, itraconazole, ketoconazole, and voriconazole Certain medications used for seizures like carbamazepine, phenytoin, and  phenobarbital Grapefruit juice Macrolide antibiotics like clarithromycin, erythromycin, troleandomycin Medications for prostate problems Rifabutin, rifampin or rifapentine This list may not describe all possible interactions. Give your health care provider a list of all the medicines, herbs, non-prescription drugs, or dietary supplements you use. Also tell them if you smoke, drink alcohol, or use illegal drugs. Some items may interact with your medicine. What should I watch for while using this medication? If you notice any changes in your vision while taking this medication, call your care team as soon as possible. Stop using this medication and call your care team right away if you have a loss of sight in one or both eyes. Contact your care team right away if the erection lasts longer than 4 hours or if it becomes painful. This may be a sign of serious problem and must be treated right away to prevent permanent damage. If you experience symptoms of nausea, dizziness, chest pain or arm pain upon initiation of sexual activity after taking this medication, you should refrain from further activity and call your care team as soon as possible. Do not drink alcohol to excess (examples, 5 glasses of wine or 5 shots of whiskey) when taking this medication. When taken in excess, alcohol can increase your chances of getting a headache or getting dizzy, increasing your heart rate or lowering your blood pressure. Using this medication does not protect you or your partner against HIV infection (the virus that causes AIDS) or other sexually transmitted diseases. What side effects may I notice from receiving this medication? Side effects that you should report to your care team as soon as possible: Allergic reactions--skin rash, itching, hives, swelling of the face, lips, tongue, or throat Hearing loss or ringing in ears Heart attack--pain or tightness in the chest, shoulders, arms, or jaw, nausea, shortness of  breath, cold or clammy skin, feeling faint or lightheaded Low blood pressure--dizziness, feeling faint or lightheaded, blurry vision Prolonged or painful erection Redness, blistering, peeling, or loosening of the skin, including inside the mouth Stroke--sudden numbness or weakness of the face, arm, or leg, trouble speaking, confusion, trouble walking, loss of balance or coordination, dizziness, severe headache, change in vision Sudden vision loss in one or both eyes Side effects that usually do not require medical attention (report to  your care team if they continue or are bothersome): Back pain Facial flushing or redness Headache Muscle pain Runny or stuffy nose Upset stomach This list may not describe all possible side effects. Call your doctor for medical advice about side effects. You may report side effects to FDA at 1-800-FDA-1088. Where should I keep my medication? Keep out of the reach of children. Store at room temperature between 15 and 30 degrees C (59 and 86 degrees F). Throw away any unused medication after the expiration date. NOTE: This sheet is a summary. It may not cover all possible information. If you have questions about this medicine, talk to your doctor, pharmacist, or health care provider.  2022 Elsevier/Gold Standard (2020-06-03 00:00:00)

## 2021-05-23 ENCOUNTER — Ambulatory Visit: Payer: Medicare Other | Admitting: Dermatology

## 2021-06-30 ENCOUNTER — Ambulatory Visit (INDEPENDENT_AMBULATORY_CARE_PROVIDER_SITE_OTHER): Payer: Medicare Other | Admitting: Dermatology

## 2021-06-30 DIAGNOSIS — C44319 Basal cell carcinoma of skin of other parts of face: Secondary | ICD-10-CM

## 2021-06-30 DIAGNOSIS — D229 Melanocytic nevi, unspecified: Secondary | ICD-10-CM

## 2021-06-30 DIAGNOSIS — D1800 Hemangioma unspecified site: Secondary | ICD-10-CM

## 2021-06-30 DIAGNOSIS — Z85820 Personal history of malignant melanoma of skin: Secondary | ICD-10-CM

## 2021-06-30 DIAGNOSIS — L570 Actinic keratosis: Secondary | ICD-10-CM

## 2021-06-30 DIAGNOSIS — Z85828 Personal history of other malignant neoplasm of skin: Secondary | ICD-10-CM | POA: Diagnosis not present

## 2021-06-30 DIAGNOSIS — Z1283 Encounter for screening for malignant neoplasm of skin: Secondary | ICD-10-CM | POA: Diagnosis not present

## 2021-06-30 DIAGNOSIS — L820 Inflamed seborrheic keratosis: Secondary | ICD-10-CM

## 2021-06-30 DIAGNOSIS — L578 Other skin changes due to chronic exposure to nonionizing radiation: Secondary | ICD-10-CM | POA: Diagnosis not present

## 2021-06-30 DIAGNOSIS — D485 Neoplasm of uncertain behavior of skin: Secondary | ICD-10-CM

## 2021-06-30 DIAGNOSIS — L814 Other melanin hyperpigmentation: Secondary | ICD-10-CM

## 2021-06-30 DIAGNOSIS — L821 Other seborrheic keratosis: Secondary | ICD-10-CM

## 2021-06-30 NOTE — Patient Instructions (Signed)
Cryotherapy Aftercare  Wash gently with soap and water everyday.   Apply Vaseline and Band-Aid daily until healed.    Wound Care Instructions  Cleanse wound gently with soap and water once a day then pat dry with clean gauze. Apply a thing coat of Petrolatum (petroleum jelly, "Vaseline") over the wound (unless you have an allergy to this). We recommend that you use a new, sterile tube of Vaseline. Do not pick or remove scabs. Do not remove the yellow or white "healing tissue" from the base of the wound.  Cover the wound with fresh, clean, nonstick gauze and secure with paper tape. You may use Band-Aids in place of gauze and tape if the would is small enough, but would recommend trimming much of the tape off as there is often too much. Sometimes Band-Aids can irritate the skin.  You should call the office for your biopsy report after 1 week if you have not already been contacted.  If you experience any problems, such as abnormal amounts of bleeding, swelling, significant bruising, significant pain, or evidence of infection, please call the office immediately.  FOR ADULT SURGERY PATIENTS: If you need something for pain relief you may take 1 extra strength Tylenol (acetaminophen) AND 2 Ibuprofen (200mg each) together every 4 hours as needed for pain. (do not take these if you are allergic to them or if you have a reason you should not take them.) Typically, you may only need pain medication for 1 to 3 days.        If You Need Anything After Your Visit  If you have any questions or concerns for your doctor, please call our main line at [phone] and press option 4 to reach your doctor's medical assistant. If no one answers, please leave a voicemail as directed and we will return your call as soon as possible. Messages left after 4 pm will be answered the following business day.   You may also send us a message via MyChart. We typically respond to MyChart messages within 1-2 business  days.  For prescription refills, please ask your pharmacy to contact our office. Our fax number is [phone].  If you have an urgent issue when the clinic is closed that cannot wait until the next business day, you can page your doctor at the number below.    Please note that while we do our best to be available for urgent issues outside of office hours, we are not available 24/7.   If you have an urgent issue and are unable to reach us, you may choose to seek medical care at your doctor's office, retail clinic, urgent care center, or emergency room.  If you have a medical emergency, please immediately call 911 or go to the emergency department.  Pager Numbers  - Dr. Kowalski: [phone]  - Dr. Moye: [phone]  - Dr. Stewart: [phone]  In the event of inclement weather, please call our main line at [phone] for an update on the status of any delays or closures.  Dermatology Medication Tips: Please keep the boxes that topical medications come in in order to help keep track of the instructions about where and how to use these. Pharmacies typically print the medication instructions only on the boxes and not directly on the medication tubes.   If your medication is too expensive, please contact our office at [phone] option 4 or send us a message through MyChart.   We are unable to tell what your co-pay for medications will be   in advance as this is different depending on your insurance coverage. However, we may be able to find a substitute medication at lower cost or fill out paperwork to get insurance to cover a needed medication.   If a prior authorization is required to get your medication covered by your insurance company, please allow us 1-2 business days to complete this process.  Drug prices often vary depending on where the prescription is filled and some pharmacies may offer cheaper prices.  The website www.goodrx.com contains coupons for medications through  different pharmacies. The prices here do not account for what the cost may be with help from insurance (it may be cheaper with your insurance), but the website can give you the price if you did not use any insurance.  - You can print the associated coupon and take it with your prescription to the pharmacy.  - You may also stop by our office during regular business hours and pick up a GoodRx coupon card.  - If you need your prescription sent electronically to a different pharmacy, notify our office through [facility] MyChart or by phone at [phone] option 4.     Si Usted Necesita Algo Despus de Su Visita  Tambin puede enviarnos un mensaje a travs de MyChart. Por lo general respondemos a los mensajes de MyChart en el transcurso de 1 a 2 das hbiles.  Para renovar recetas, por favor pida a su farmacia que se ponga en contacto con nuestra oficina. Nuestro nmero de fax es el [phone].  Si tiene un asunto urgente cuando la clnica est cerrada y que no puede esperar hasta el siguiente da hbil, puede llamar/localizar a su doctor(a) al nmero que aparece a continuacin.   Por favor, tenga en cuenta que aunque hacemos todo lo posible para estar disponibles para asuntos urgentes fuera del horario de oficina, no estamos disponibles las 24 horas del da, los 7 das de la semana.   Si tiene un problema urgente y no puede comunicarse con nosotros, puede optar por buscar atencin mdica  en el consultorio de su doctor(a), en una clnica privada, en un centro de atencin urgente o en una sala de emergencias.  Si tiene una emergencia mdica, por favor llame inmediatamente al 911 o vaya a la sala de emergencias.  Nmeros de bper  - Dr. Kowalski: [phone]  - Dra. Moye: [phone]  - Dra. Stewart: [phone]  En caso de inclemencias del tiempo, por favor llame a nuestra lnea principal al [phone] para una actualizacin sobre el estado de cualquier retraso o cierre.  Consejos  para la medicacin en dermatologa: Por favor, guarde las cajas en las que vienen los medicamentos de uso tpico para ayudarle a seguir las instrucciones sobre dnde y cmo usarlos. Las farmacias generalmente imprimen las instrucciones del medicamento slo en las cajas y no directamente en los tubos del medicamento.   Si su medicamento es muy caro, por favor, pngase en contacto con nuestra oficina llamando al [phone] y presione la opcin 4 o envenos un mensaje a travs de MyChart.   No podemos decirle cul ser su copago por los medicamentos por adelantado ya que esto es diferente dependiendo de la cobertura de su seguro. Sin embargo, es posible que podamos encontrar un medicamento sustituto a menor costo o llenar un formulario para que el seguro cubra el medicamento que se considera necesario.   Si se requiere una autorizacin previa para que su compaa de seguros cubra su medicamento, por favor permtanos de 1 a   2 das hbiles para completar este proceso.  Los precios de los medicamentos varan con frecuencia dependiendo del lugar de dnde se surte la receta y alguna farmacias pueden ofrecer precios ms baratos.  El sitio web www.goodrx.com tiene cupones para medicamentos de diferentes farmacias. Los precios aqu no tienen en cuenta lo que podra costar con la ayuda del seguro (puede ser ms barato con su seguro), pero el sitio web puede darle el precio si no utiliz ningn seguro.  - Puede imprimir el cupn correspondiente y llevarlo con su receta a la farmacia.  - Tambin puede pasar por nuestra oficina durante el horario de atencin regular y recoger una tarjeta de cupones de GoodRx.  - Si necesita que su receta se enve electrnicamente a una farmacia diferente, informe a nuestra oficina a travs de MyChart de [facility] o por telfono llamando al [phone] y presione la opcin 4.  

## 2021-06-30 NOTE — Progress Notes (Signed)
? ?Follow-Up Visit ?  ?Subjective  ?Austin Sullivan is a 66 y.o. male who presents for the following: Other (History of Melanoma, BCC - TBSE today). ?The patient presents for Total-Body Skin Exam (TBSE) for skin cancer screening and mole check.  The patient has spots, moles and lesions to be evaluated, some may be new or changing and the patient has concerns that these could be cancer. ?The patient complains of a bleeding spot above the left lateral brow. ? ?The following portions of the chart were reviewed this encounter and updated as appropriate:  ? Tobacco  Allergies  Meds  Problems  Med Hx  Surg Hx  Fam Hx   ?  ?Review of Systems:  No other skin or systemic complaints except as noted in HPI or Assessment and Plan. ? ?Objective  ?Well appearing patient in no apparent distress; mood and affect are within normal limits. ? ?A full examination was performed including scalp, head, eyes, ears, nose, lips, neck, chest, axillae, abdomen, back, buttocks, bilateral upper extremities, bilateral lower extremities, hands, feet, fingers, toes, fingernails, and toenails. All findings within normal limits unless otherwise noted below. ? ?Left forehead above lat brow ?Oval pink patch ? ? ? ? ? ? ?Left neck ?Erythematous stuck-on, waxy papule or plaque ? ?Right neck ?Erythematous thin papules/macules with gritty scale.  ? ? ?Assessment & Plan  ? ?History of Melanoma -metastatic -status post surgery; chemotherapy; and Keytruda treatments ?- No evidence of recurrence today ?- No lymphadenopathy ?- Recommend regular full body skin exams ?- Recommend daily broad spectrum sunscreen SPF 30+ to sun-exposed areas, reapply every 2 hours as needed.  ?- Call if any new or changing lesions are noted between office visits ? ?History of Basal Cell Carcinoma of the Skin ?- No evidence of recurrence today ?- Recommend regular full body skin exams ?- Recommend daily broad spectrum sunscreen SPF 30+ to sun-exposed areas, reapply every 2 hours  as needed.  ?- Call if any new or changing lesions are noted between office visits ? ?Lentigines ?- Scattered tan macules ?- Due to sun exposure ?- Benign-appearing, observe ?- Recommend daily broad spectrum sunscreen SPF 30+ to sun-exposed areas, reapply every 2 hours as needed. ?- Call for any changes ? ?Seborrheic Keratoses ?- Stuck-on, waxy, tan-brown papules and/or plaques  ?- Benign-appearing ?- Discussed benign etiology and prognosis. ?- Observe ?- Call for any changes ? ?Melanocytic Nevi ?- Tan-brown and/or pink-flesh-colored symmetric macules and papules ?- Benign appearing on exam today ?- Observation ?- Call clinic for new or changing moles ?- Recommend daily use of broad spectrum spf 30+ sunscreen to sun-exposed areas.  ? ?Hemangiomas ?- Red papules ?- Discussed benign nature ?- Observe ?- Call for any changes ? ?Actinic Damage ?- Chronic condition, secondary to cumulative UV/sun exposure ?- diffuse scaly erythematous macules with underlying dyspigmentation ?- Recommend daily broad spectrum sunscreen SPF 30+ to sun-exposed areas, reapply every 2 hours as needed.  ?- Staying in the shade or wearing long sleeves, sun glasses (UVA+UVB protection) and wide brim hats (4-inch brim around the entire circumference of the hat) are also recommended for sun protection.  ?- Call for new or changing lesions. ? ?Skin cancer screening performed today. ? ?Neoplasm of uncertain behavior of skin ?Left forehead above lat brow ? ?Skin / nail biopsy ?Type of biopsy: tangential   ?Informed consent: discussed and consent obtained   ?Patient was prepped and draped in usual sterile fashion: Area prepped with alcohol. ?Anesthesia: the lesion was anesthetized in a  standard fashion   ?Anesthetic:  1% lidocaine w/ epinephrine 1-100,000 buffered w/ 8.4% NaHCO3 ?Instrument used: flexible razor blade   ?Hemostasis achieved with: pressure, aluminum chloride and electrodesiccation   ?Outcome: patient tolerated procedure well    ?Post-procedure details: wound care instructions given   ?Post-procedure details comment:  Ointment and small bandage applied ? ?Specimen 1 - Surgical pathology ?Differential Diagnosis: R/O BCC ?Check Margins: No ? ?Inflamed seborrheic keratosis ?Left neck ? ?Destruction of lesion - Left neck ?Complexity: simple   ?Destruction method: cryotherapy   ?Informed consent: discussed and consent obtained   ?Timeout:  patient name, date of birth, surgical site, and procedure verified ?Lesion destroyed using liquid nitrogen: Yes   ?Region frozen until ice ball extended beyond lesion: Yes   ?Outcome: patient tolerated procedure well with no complications   ?Post-procedure details: wound care instructions given   ? ?AK (actinic keratosis) ?Right neck ? ?Destruction of lesion - Right neck ?Complexity: simple   ?Destruction method: cryotherapy   ?Informed consent: discussed and consent obtained   ?Timeout:  patient name, date of birth, surgical site, and procedure verified ?Lesion destroyed using liquid nitrogen: Yes   ?Region frozen until ice ball extended beyond lesion: Yes   ?Outcome: patient tolerated procedure well with no complications   ?Post-procedure details: wound care instructions given   ? ?Skin cancer screening ? ?Return in about 6 months (around 12/31/2021) for TBSE. ? ?I, Ashok Cordia, CMA, am acting as scribe for [name], MD . ?Documentation: I have reviewed the above documentation for accuracy and completeness, and I agree with the above. ? ?[name], MD ? ?

## 2021-07-01 ENCOUNTER — Encounter: Payer: Self-pay | Admitting: Dermatology

## 2021-07-12 ENCOUNTER — Telehealth: Payer: Self-pay

## 2021-07-12 ENCOUNTER — Other Ambulatory Visit: Payer: Self-pay

## 2021-07-12 DIAGNOSIS — C44319 Basal cell carcinoma of skin of other parts of face: Secondary | ICD-10-CM

## 2021-07-12 NOTE — Telephone Encounter (Signed)
Called pt discussed biopsy results, pt would like to go to Dr Lacinda Axon at [facility] for Mohs treatment  ?

## 2021-10-18 ENCOUNTER — Other Ambulatory Visit: Payer: Self-pay | Admitting: Orthopedic Surgery

## 2021-10-20 ENCOUNTER — Other Ambulatory Visit: Payer: Self-pay

## 2021-10-20 ENCOUNTER — Encounter
Admission: RE | Admit: 2021-10-20 | Discharge: 2021-10-20 | Disposition: A | Discharge status: Home / Self Care | Payer: Medicare Other | Source: Ambulatory Visit | Attending: Orthopedic Surgery | Admitting: Orthopedic Surgery

## 2021-10-20 VITALS — BP 167/62 | HR 85 | Resp 16 | Ht 73.0 in | Wt 252.2 lb

## 2021-10-20 DIAGNOSIS — Z01818 Encounter for other preprocedural examination: Secondary | ICD-10-CM | POA: Insufficient documentation

## 2021-10-20 DIAGNOSIS — E119 Type 2 diabetes mellitus without complications: Secondary | ICD-10-CM

## 2021-10-20 DIAGNOSIS — Z01812 Encounter for preprocedural laboratory examination: Secondary | ICD-10-CM

## 2021-10-20 LAB — CBC WITH DIFFERENTIAL/PLATELET
Abs Immature Granulocytes: 0.03 10*3/uL (ref 0.00–0.07)
Basophils Absolute: 0.1 10*3/uL (ref 0.0–0.1)
Basophils Relative: 1 %
Eosinophils Absolute: 0.2 10*3/uL (ref 0.0–0.5)
Eosinophils Relative: 2 %
HCT: 42.1 % (ref 39.0–52.0)
Hemoglobin: 13.2 g/dL (ref 13.0–17.0)
Immature Granulocytes: 0 %
Lymphocytes Relative: 11 %
Lymphs Abs: 0.7 10*3/uL (ref 0.7–4.0)
MCH: 26.5 pg (ref 26.0–34.0)
MCHC: 31.4 g/dL (ref 30.0–36.0)
MCV: 84.4 fL (ref 80.0–100.0)
Monocytes Absolute: 0.5 10*3/uL (ref 0.1–1.0)
Monocytes Relative: 7 %
Neutro Abs: 5.4 10*3/uL (ref 1.7–7.7)
Neutrophils Relative %: 79 %
Platelets: 239 10*3/uL (ref 150–400)
RBC: 4.99 MIL/uL (ref 4.22–5.81)
RDW: 17.5 % — ABNORMAL HIGH (ref 11.5–15.5)
WBC: 6.8 10*3/uL (ref 4.0–10.5)
nRBC: 0 % (ref 0.0–0.2)

## 2021-10-20 LAB — URINALYSIS, ROUTINE W REFLEX MICROSCOPIC
Bacteria, UA: NONE SEEN
Bilirubin Urine: NEGATIVE
Glucose, UA: NEGATIVE mg/dL
Hgb urine dipstick: NEGATIVE
Ketones, ur: 5 mg/dL — AB
Nitrite: NEGATIVE
Protein, ur: 100 mg/dL — AB
Specific Gravity, Urine: 1.025 (ref 1.005–1.030)
pH: 5 (ref 5.0–8.0)

## 2021-10-20 LAB — COMPREHENSIVE METABOLIC PANEL
ALT: 25 U/L (ref 0–44)
AST: 21 U/L (ref 15–41)
Albumin: 4.2 g/dL (ref 3.5–5.0)
Alkaline Phosphatase: 88 U/L (ref 38–126)
Anion gap: 12 (ref 5–15)
BUN: 21 mg/dL (ref 8–23)
CO2: 25 mmol/L (ref 22–32)
Calcium: 9 mg/dL (ref 8.9–10.3)
Chloride: 106 mmol/L (ref 98–111)
Creatinine, Ser: 0.83 mg/dL (ref 0.61–1.24)
GFR, Estimated: >60 mL/min (ref >60)
Glucose, Bld: 108 mg/dL — ABNORMAL HIGH (ref 70–99)
Potassium: 4.1 mmol/L (ref 3.5–5.1)
Sodium: 143 mmol/L (ref 135–145)
Total Bilirubin: 0.7 mg/dL (ref 0.3–1.2)
Total Protein: 7.2 g/dL (ref 6.5–8.1)

## 2021-10-20 LAB — TYPE AND SCREEN
ABO/RH(D): O NEG
Antibody Screen: NEGATIVE

## 2021-10-20 LAB — SURGICAL PCR SCREEN
MRSA, PCR: NEGATIVE
Staphylococcus aureus: NEGATIVE

## 2021-10-20 NOTE — Patient Instructions (Addendum)
Your procedure is scheduled on: 11/01/21 - Tuesday Report to the Registration Desk on the 1st floor of the [facility]. To find out your arrival time, please call [phone] between 1PM - 3PM on: 10/31/21 - Monday If your arrival time is 6:00 am, do not arrive prior to that time as the [facility] entrance doors do not open until 6:00 am.  REMEMBER: Instructions that are not followed completely may result in serious medical risk, up to and including death; or upon the discretion of your surgeon and anesthesiologist your surgery may need to be rescheduled.  Do not eat food after midnight the night before surgery.  No gum chewing, lozengers or hard candies.  You may however, drink CLEAR liquids up to 2 hours before you are scheduled to arrive for your surgery. Do not drink anything within 2 hours of your scheduled arrival time.  Clear liquids include: - water  Type 1 and Type 2 diabetics should only drink water.  In addition, your doctor has ordered for you to drink the provided  Gatorade G2 Drinking this carbohydrate drink up to two hours before surgery helps to reduce insulin resistance and improve patient outcomes. Please complete drinking 2 hours prior to scheduled arrival time.  TAKE THESE MEDICATIONS THE MORNING OF SURGERY WITH A SIP OF WATER:  - alfuzosin (UROXATRAL) - Azelastine SPRAY - dutasteride (AVODART) - fluticasone (FLONASE) - BREO ELLIPTA INH - ipratropium (ATROVENT) - ipratropium-albuterol (DUONEB)  - metoprolol tartrate  - pregabalin (LYRICA)  - gabapentin (NEURONTIN)   STOP taking metFORMIN (GLUCOPHAGE) beginning 10/30/21, may resume the day after surgery.  One week prior to surgery:meloxicam [facility])  Stop Anti-inflammatories (NSAIDS) such as Advil, Aleve, Ibuprofen, Motrin, Naproxen, Naprosyn and Aspirin based products such as Excedrin, Goodys Powder, BC Powder.  Stop ANY OVER THE COUNTER supplements until after surgery.  You may take Tylenol if  needed for pain up until the day of surgery.  No Alcohol for 24 hours before or after surgery.  No Smoking including e-cigarettes for 24 hours prior to surgery.  No chewable tobacco products for at least 6 hours prior to surgery.  No nicotine patches on the day of surgery.  Do not use any "recreational" drugs for at least a week prior to your surgery.  Please be advised that the combination of cocaine and anesthesia may have negative outcomes, up to and including death. If you test positive for cocaine, your surgery will be cancelled.  On the morning of surgery brush your teeth with toothpaste and water, you may rinse your mouth with mouthwash if you wish. Do not swallow any toothpaste or mouthwash.  Use CHG Soap or wipes as directed on instruction sheet.  Do not wear jewelry, make-up, hairpins, clips or nail polish.  Do not wear lotions, powders, or perfumes.   Do not shave body from the neck down 48 hours prior to surgery just in case you cut yourself which could leave a site for infection.  Also, freshly shaved skin may become irritated if using the CHG soap.  Contact lenses, hearing aids and dentures may not be worn into surgery.  Do not bring valuables to the hospital. [facility] is not responsible for any missing/lost belongings or valuables.   Bring your C-PAP to the hospital with you in case you may have to spend the night.   Notify your doctor if there is any change in your medical condition (cold, fever, infection).  Wear comfortable clothing (specific to your surgery type) to  the hospital.  After surgery, you can help prevent lung complications by doing breathing exercises.  Take deep breaths and cough every 1-2 hours. Your doctor may order a device called an Incentive Spirometer to help you take deep breaths. When coughing or sneezing, hold a pillow firmly against your incision with both hands. This is called "splinting." Doing this helps protect your incision. It  also decreases belly discomfort.  If you are being admitted to the hospital overnight, leave your suitcase in the car. After surgery it may be brought to your room.  If you are being discharged the day of surgery, you will not be allowed to drive home. You will need a responsible adult (18 years or older) to drive you home and stay with you that night.   If you are taking public transportation, you will need to have a responsible adult (18 years or older) with you. Please confirm with your physician that it is acceptable to use public transportation.   Please call the [facility] Dept. at [phone] if you have any questions about these instructions.  Surgery Visitation Policy:  Patients undergoing a surgery or procedure may have two family members or support persons with them as long as the person is not COVID-19 positive or experiencing its symptoms.   Inpatient Visitation:    Visiting hours are 7 a.m. to 8 p.m. Up to four visitors are allowed at one time in a patient room, including children. The visitors may rotate out with other people during the day. One designated support person (adult) may remain overnight.

## 2021-10-21 NOTE — TOC Progression Note (Signed)
Transition of Care [facility]) - Progression Note    Patient Details  Name: Austin Sullivan MRN: [id number] Date of Birth: 31-Aug-1955  Transition of Care [facility]) CM/SW [place], RN Phone Number: 10/21/2021, 1:58 PM  Clinical Narrative:    Patient attended Joint Class, He stated that he has a rolling walker but not a 3 in1, [employer] will deliver the 3 in 1 to his home prior to surgery, His wife Olegario Shearer will provide transportation, he has a raised toilet and a shower seat as well as a hand held shower, He can afford his medication        Expected Discharge Plan and Services                                                 Social Determinants of Health (SDOH) Interventions    Readmission Risk Interventions     No data to display

## 2021-10-23 IMAGING — DX DG KNEE 1-2V PORT*L*
4 series · 4 of 4 positions shown · non-contrast
Comparison: None.

CLINICAL DATA: Knee pain.  Sepsis

EXAM:
PORTABLE LEFT KNEE - 1-2 VIEW

[knee ap (1 of 2)]
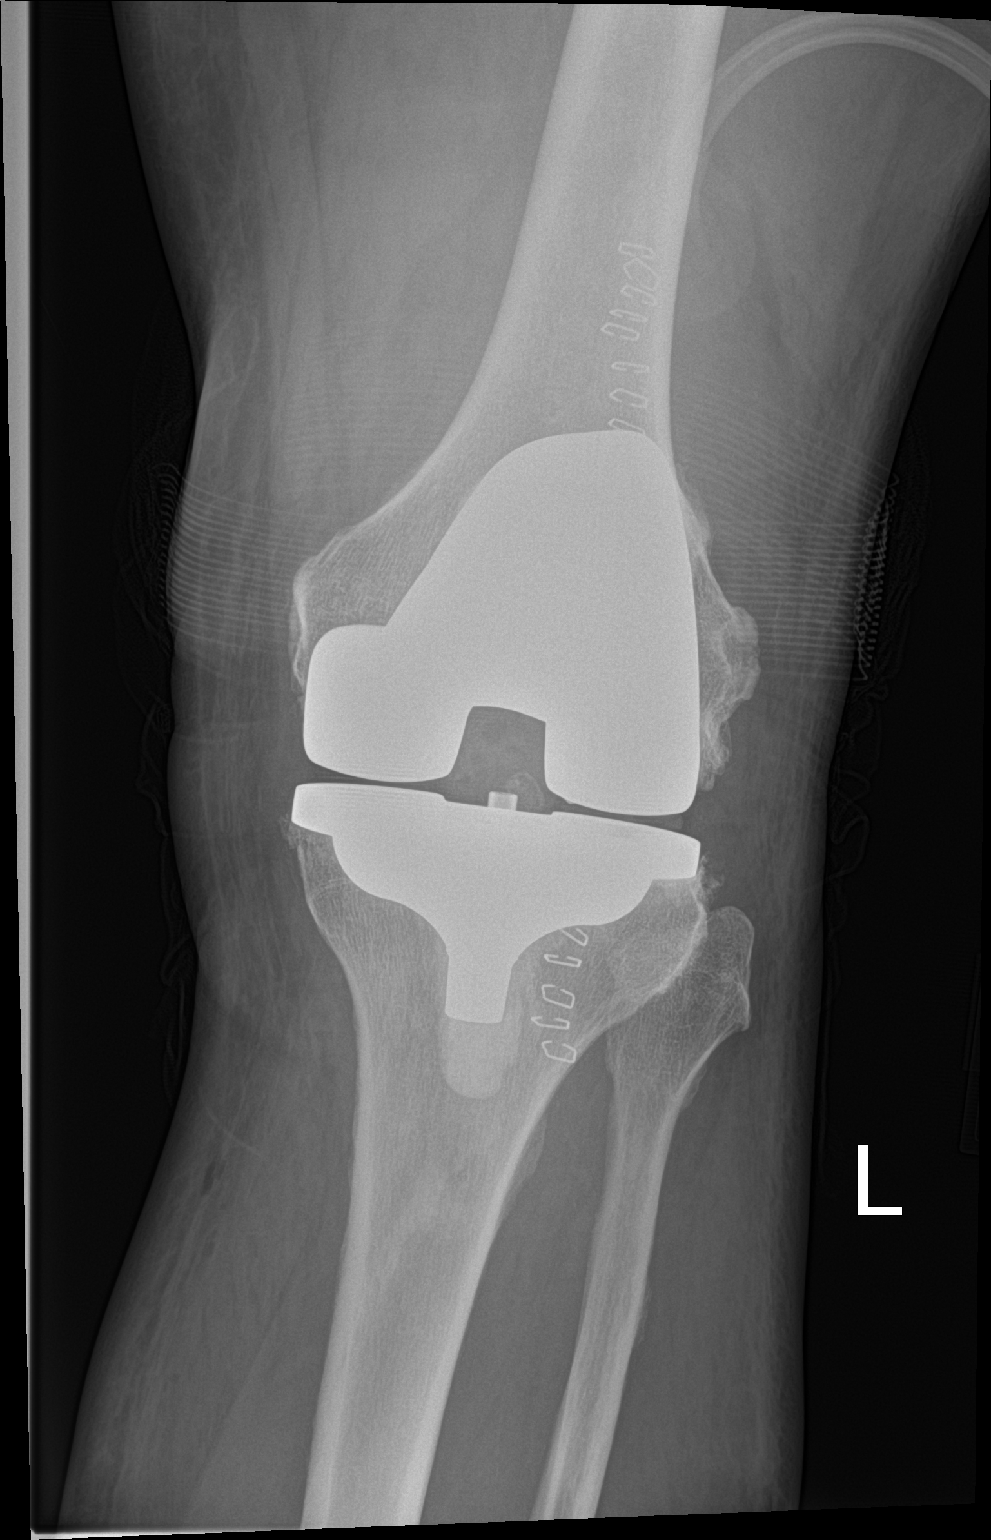

[knee ap (2 of 2)]
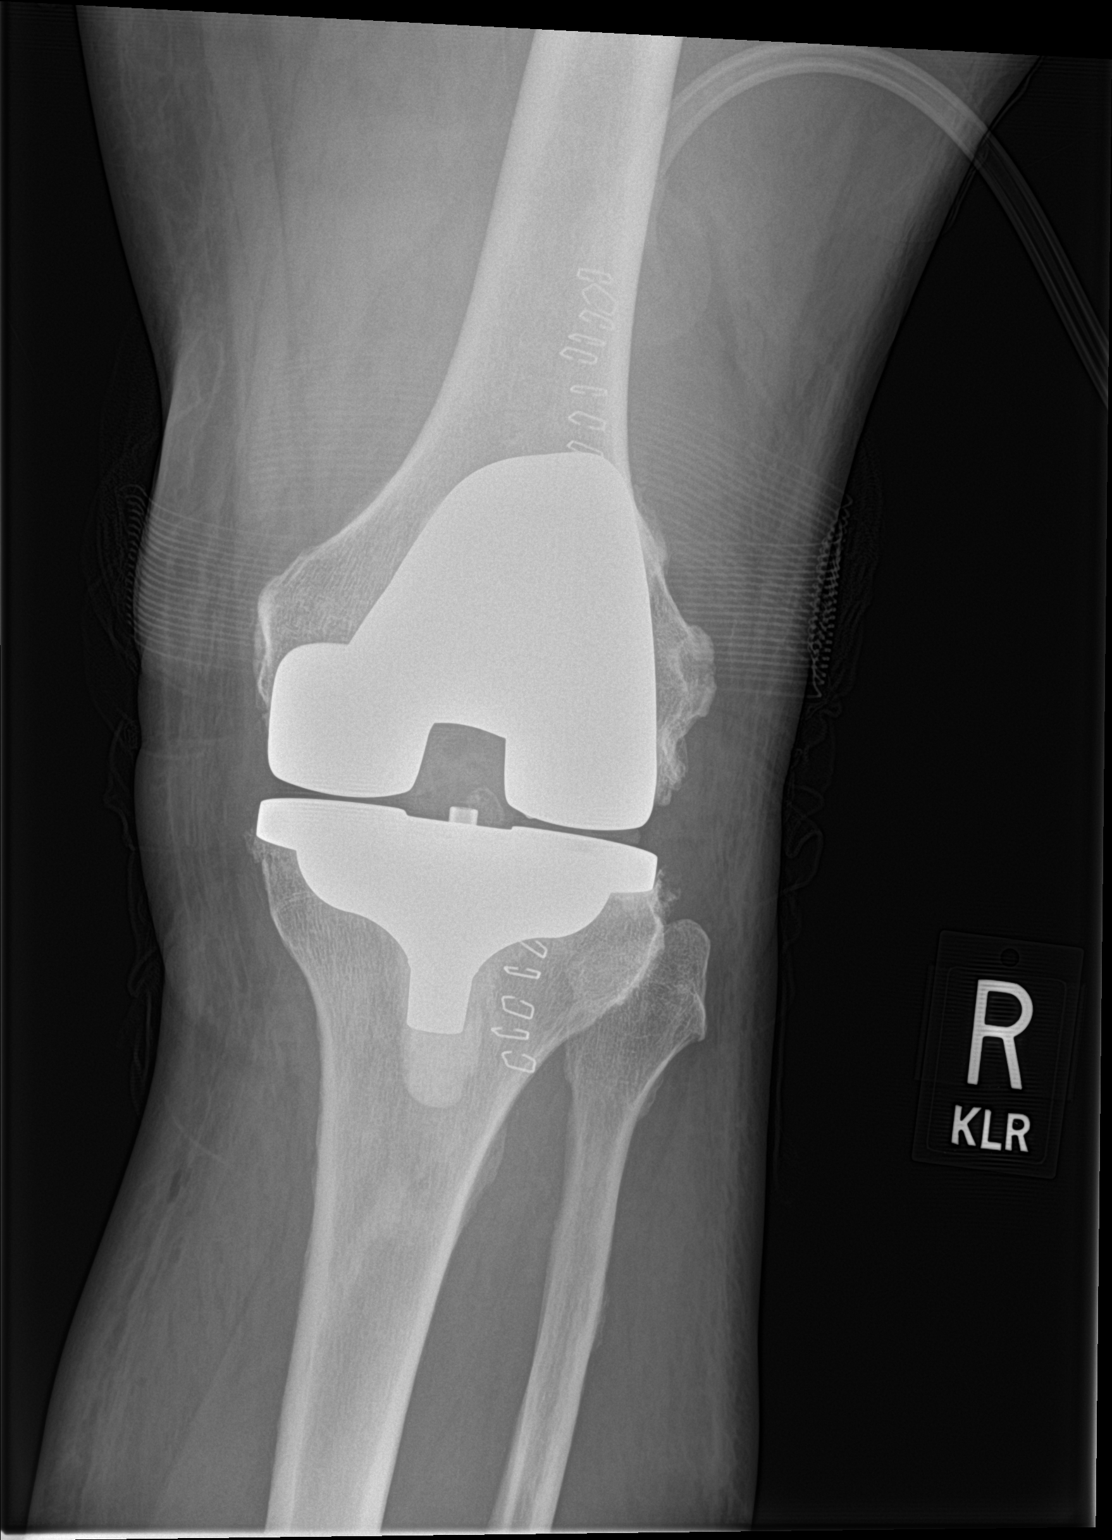

[knee lat (1 of 2)]
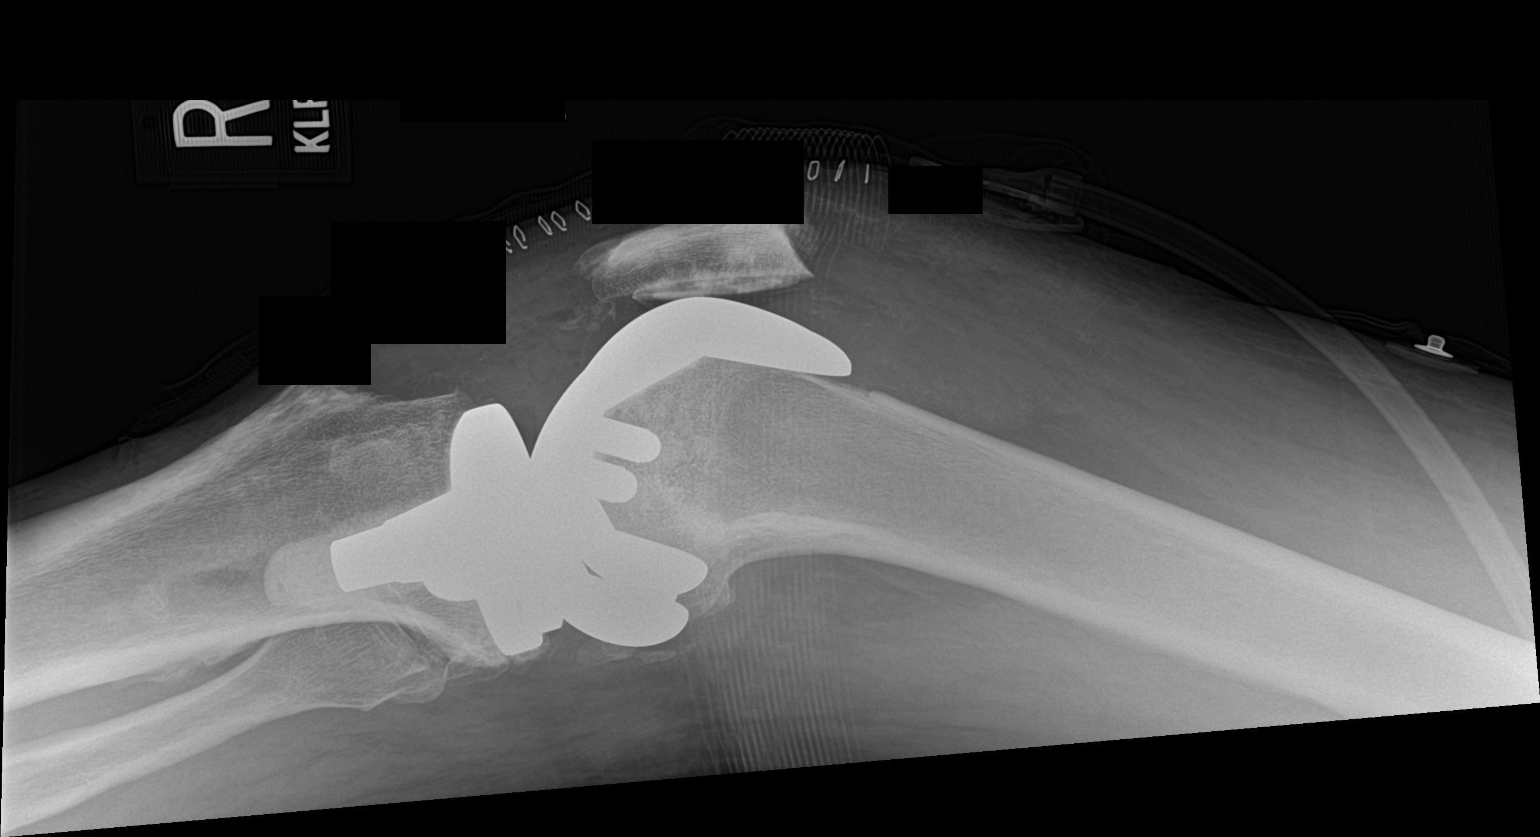

[knee lat (2 of 2)]
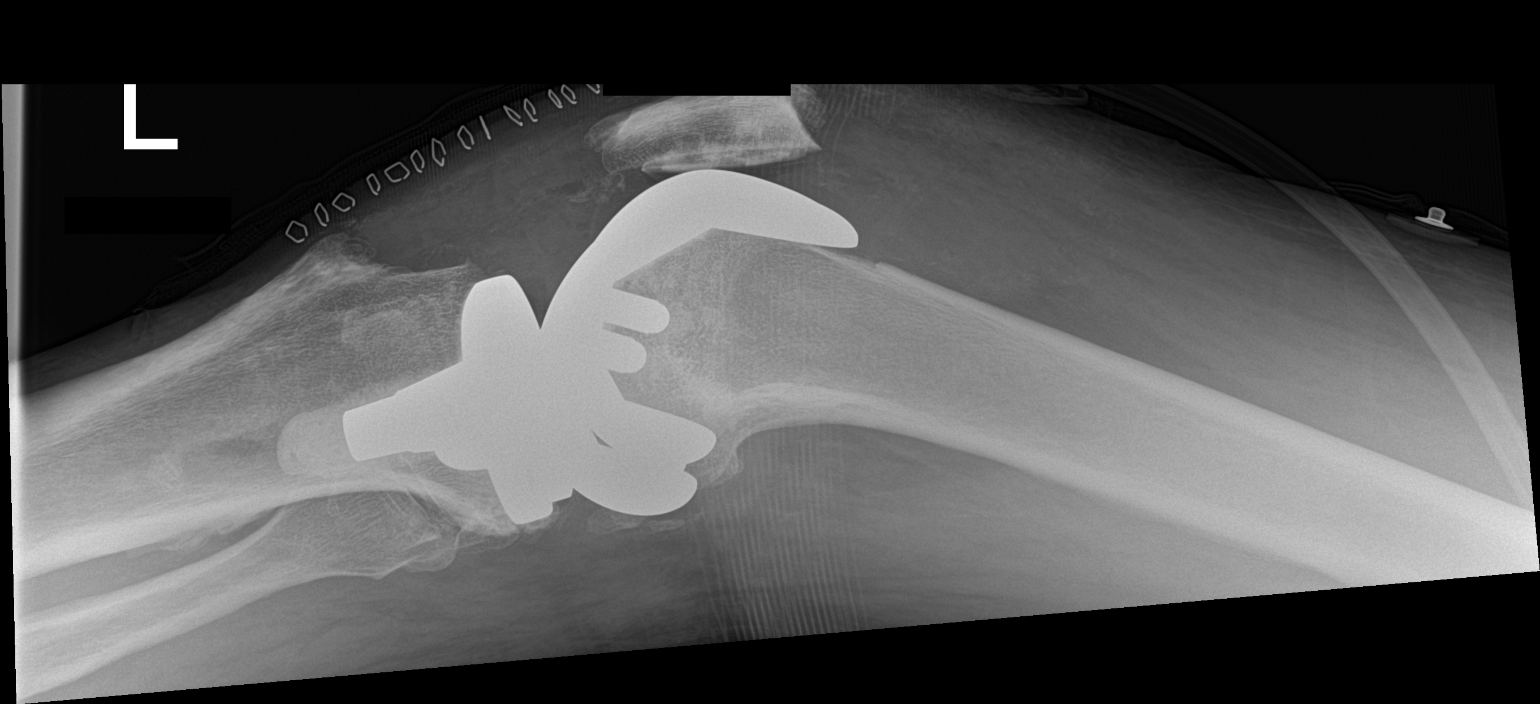

[4 of 4 positions shown; findings below may reference images not displayed]

FINDINGS: Post total knee arthroplasty. Prosthetic components appear well
seated. Expected soft tissue changes.
IMPRESSION: Post total arthroplasty without complication.

## 2021-10-25 IMAGING — CR DG ABDOMEN 1V
2 series · 2 of 2 positions shown · non-contrast
Comparison: Abdominal CT 2 days ago.

CLINICAL DATA: Abdominal distension.  Constipation.

EXAM:
ABDOMEN - 1 VIEW

[abdomen kub (1 of 2)]
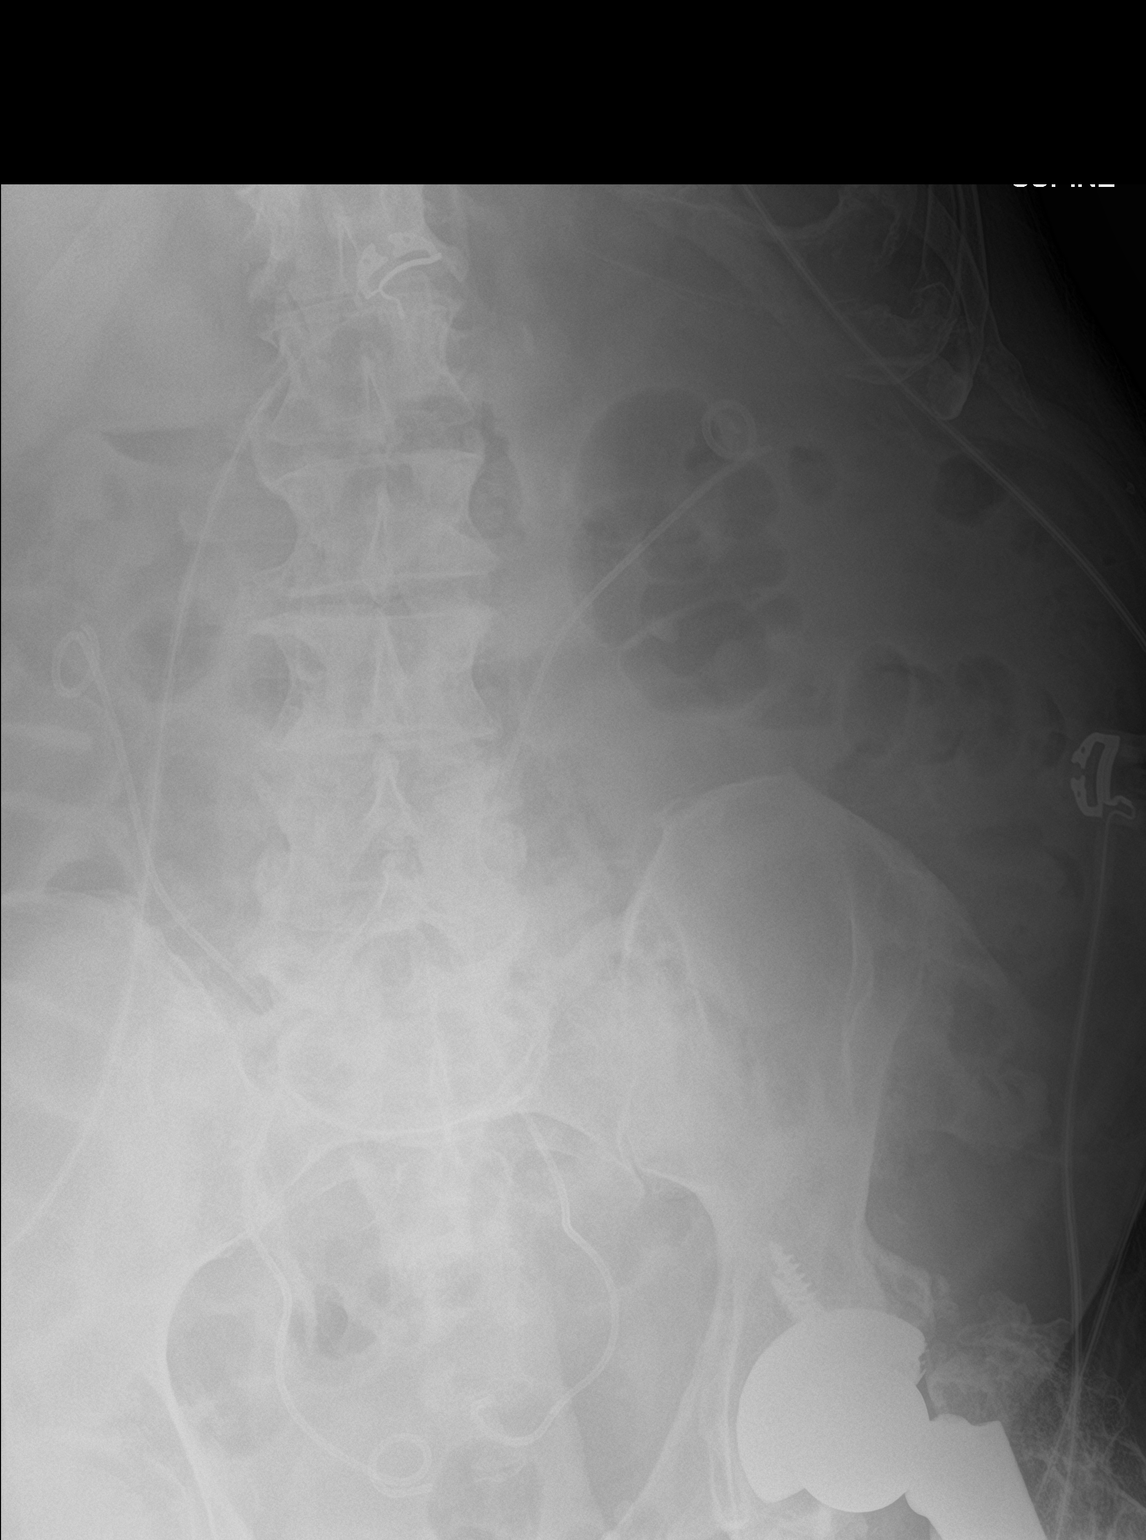

[abdomen kub (2 of 2)]
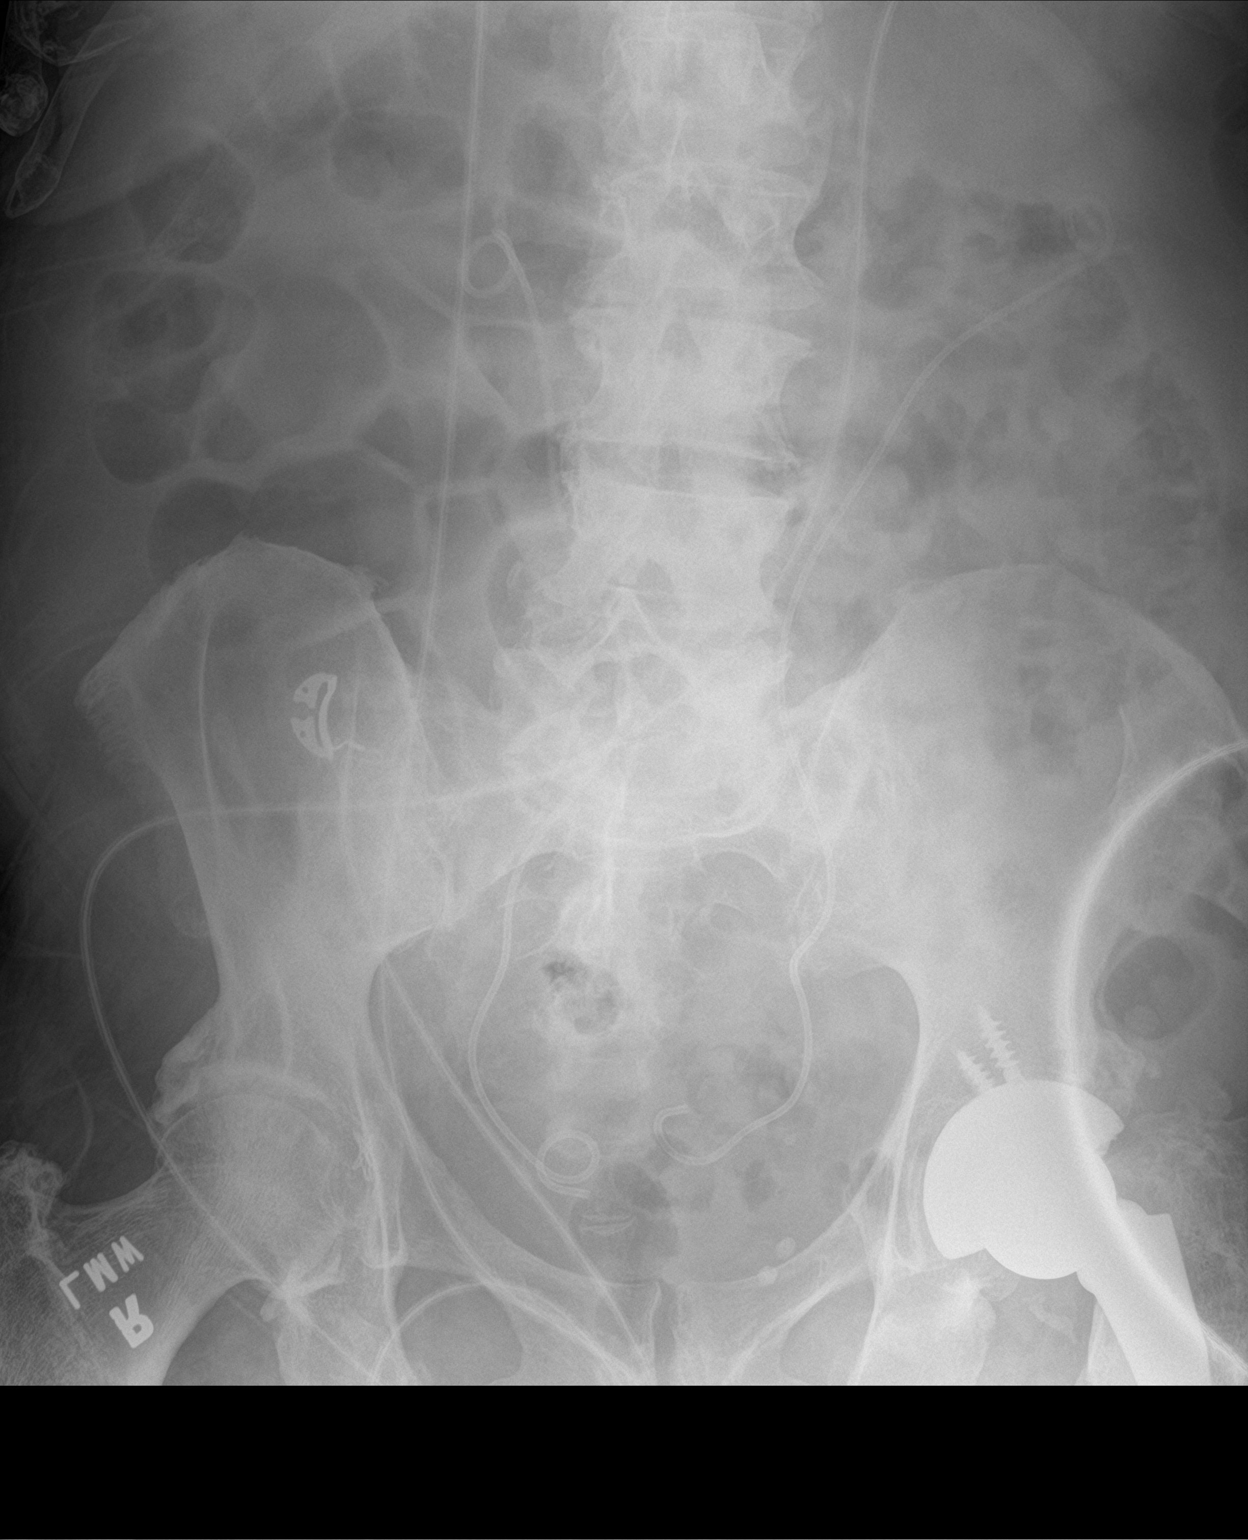

[2 of 2 positions shown; findings below may reference images not displayed]

FINDINGS: Bilateral ureteral stents in place. Renal calculi on prior CT are
not well visualized on the current exam. Increased air throughout
small and large bowel. Small volume of formed stool in the colon.
Left hip arthroplasty.
IMPRESSION: Increased air throughout small and large bowel suggesting ileus.
Bilateral ureteral stents in place.

## 2021-10-29 ENCOUNTER — Ambulatory Visit
Admission: EM | Admit: 2021-10-29 | Discharge: 2021-10-29 | Disposition: A | Discharge status: Home / Self Care | Payer: Medicare Other

## 2021-10-29 ENCOUNTER — Encounter: Payer: Self-pay | Admitting: Emergency Medicine

## 2021-10-29 DIAGNOSIS — W57XXXA Bitten or stung by nonvenomous insect and other nonvenomous arthropods, initial encounter: Secondary | ICD-10-CM | POA: Diagnosis not present

## 2021-10-29 DIAGNOSIS — R21 Rash and other nonspecific skin eruption: Secondary | ICD-10-CM | POA: Diagnosis not present

## 2021-10-29 NOTE — Discharge Instructions (Addendum)
Use a topical anti-itch such as calamine lotion.  Take Zyrtec as needed for itching. Follow up with your primary care provider if your symptoms are not improving.

## 2021-10-29 NOTE — ED Triage Notes (Signed)
Pt said x 1 day has had places on his right arm, right thigh area and left back of knee that is very itchy

## 2021-10-29 NOTE — ED Provider Notes (Signed)
[facility]    CSN: [id number] Arrival date & time: 10/29/21  1050      History   Chief Complaint Chief Complaint  Patient presents with   Rash    HPI Austin Sullivan is a 66 y.o. male.  Accompanied by his wife, patient presents with pruritic insect bites on his right forearm.  He also has 1 bite on his right thigh and 1 bite on his left leg behind his knee.  No fever, sore throat, cough, shortness of breath, or other symptoms.  No treatment at home.  His medical history includes COPD, diabetes, IBS, peripheral neuropathy, hodgkin's lymphoma, skin cancer. He is scheduled for knee replacement next week and wants to make sure the insect bites won't interfere.    The history is provided by the patient, the spouse and medical records.    Past Medical History:  Diagnosis Date   Arthritis    Basal cell carcinoma 01/30/2019   Left ear, superior helix. Nodular and infiltrative patterns. Simple excision/EDC.   Basal cell carcinoma 11/03/2019   left lat brow   Basal cell carcinoma 06/30/2021   left forehead above lat brow, schedule Mohs   BPH (benign prostatic hyperplasia)    Chemotherapy management, encounter for    FOR hodgkins lymphoma. in remission now   COPD (chronic obstructive pulmonary disease) (HCC)    Diabetes mellitus without complication ([place])    Dyspnea    WITH EXCERCISE   History of kidney stones    Hodgkin's lymphoma ([place])    chemotherapy. now in remission   Hypertension    Irritable bowel syndrome with constipation    Melanoma ([place]) 2001   Right forehead. Metastatic melanoma 2016   Peripheral neuropathy    Sepsis ([place]) 07/2020   developed stones after TKR and was readmit to hospital for this   Sleep apnea    USES CPAP   Vocal cord paralysis    wedge placed on cords so he can speak    Patient Active Problem List   Diagnosis Date Noted   Ileus ([place])    Sepsis associated hypotension (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    Past Surgical History:  Procedure Laterality Date   bph     COLONOSCOPY     CYSTOSCOPY WITH STENT PLACEMENT Bilateral 07/26/2020   Procedure: CYSTOSCOPY WITH STENT PLACEMENT;  Surgeon: Billey Co, MD;  Location: [facility];  Service: Urology;  Laterality: Bilateral;   CYSTOSCOPY/URETEROSCOPY/HOLMIUM LASER/STENT PLACEMENT Bilateral 08/27/2020   Procedure: CYSTOSCOPY/URETEROSCOPY/HOLMIUM LASER/STENT PLACEMENT;  Surgeon: Billey Co, MD;  Location: [facility];  Service: Urology;  Laterality: Bilateral;   EYE SURGERY     carterat surgery x 2   FRACTURE SURGERY     both wrist ahs metal plates   JOINT REPLACEMENT     left hip, LEFT  KNEE   nasal cavity surgery     septoplasty   ORCHIECTOMY     only 1 testicle removed d/t lump on that side   port a cath place  2017   removed port a cath  2022   TOTAL HIP ARTHROPLASTY Left    TOTAL KNEE ARTHROPLASTY Left 07/20/2020   Procedure: TOTAL KNEE ARTHROPLASTY - [name] to Assist;  Surgeon: Hessie Knows, MD;  Location: [facility] ORS;  Service: Orthopedics;  Laterality: Left;   trachostomy     vocal  cord spacer  2017   WRIST SURGERY Bilateral    PLATES AND SCREWS       Home Medications    Prior to Admission medications   Medication Sig Start Date End Date Taking? Authorizing Provider  alfuzosin (UROXATRAL) 10 MG 24 hr tablet Take 10 mg by mouth daily. 03/15/20   [provider]  Azelastine HCl 137 MCG/SPRAY SOLN Place 1 spray into the nose in the morning and at bedtime. 03/16/20   [provider]  cycloSPORINE (RESTASIS) 0.05 % ophthalmic emulsion Place 1 drop into both eyes 2 (two) times daily. Patient not taking: Reported on 10/20/2021    [provider]  dutasteride (AVODART) 0.5 MG capsule Take 0.5 mg by mouth daily. 03/11/20   [provider]  fexofenadine (ALLEGRA)  180 MG tablet Take 180 mg by mouth daily.    [provider]  fluticasone (FLONASE) 50 MCG/ACT nasal spray Place 2 sprays into both nostrils daily. 03/12/20   [provider]  fluticasone furoate-vilanterol (BREO ELLIPTA) 100-25 MCG/INH AEPB Inhale 1 puff into the lungs daily.    [provider]  gabapentin (NEURONTIN) 300 MG capsule Take 300 mg by mouth 2 (two) times daily.    [provider]  ipratropium (ATROVENT) 0.06 % nasal spray Place 2 sprays into both nostrils 2 (two) times daily. 04/21/20   [provider]  ipratropium-albuterol (DUONEB) 0.5-2.5 (3) MG/3ML SOLN Inhale 3 mLs into the lungs 2 (two) times daily as needed for shortness of breath. 07/09/20   [provider]  lidocaine-prilocaine (EMLA) cream Apply 1 application topically daily as needed (port access). Patient not taking: Reported on 10/20/2021 08/08/18   [provider]  LINZESS 145 MCG CAPS capsule Take 145 mcg by mouth daily. Patient not taking: Reported on 10/20/2021 03/22/20   [provider]  meloxicam (MOBIC) 15 MG tablet Take 15 mg by mouth daily.    [provider]  metFORMIN (GLUCOPHAGE) 1000 MG tablet Take 1,000 mg by mouth 2 (two) times daily. 04/15/20   [provider]  metoprolol tartrate (LOPRESSOR) 25 MG tablet Take 25 mg by mouth 2 (two) times daily. 02/22/21   [provider]  montelukast (SINGULAIR) 10 MG tablet Take 10 mg by mouth at bedtime. 03/21/20   [provider]  pregabalin (LYRICA) 50 MG capsule Take 50 mg by mouth See admin instructions. 50 mg with each meal, 100 mg at bedtime 03/22/20   [provider]  ramipril (ALTACE) 10 MG capsule Take 2 capsules (20 mg total) by mouth daily. Hold for now, resume when ok with your pcp. Patient taking differently: Take 20 mg by mouth daily. 07/31/20   Florencia Reasons, MD  rosuvastatin (CRESTOR) 40 MG tablet Take 40 mg by mouth at bedtime. 03/15/20   [provider]  tadalafil (CIALIS) 10 MG tablet Take 1-2 tablets (10-20 mg total) by mouth daily as needed for erectile dysfunction. 03/16/21   Billey Co, MD  traMADol (ULTRAM) 50 MG tablet Take 1 tablet (50 mg total) by mouth every 6 (six) hours as needed. Patient not taking: Reported on 10/20/2021 07/20/20   Renata Caprice    Family History History reviewed. No pertinent family history.  Social History Social History   Tobacco Use   Smoking status: Former    Types: Cigarettes    Quit date: 1978    Years since quitting: 45.6   Smokeless tobacco: Former  Scientific laboratory technician Use: Never used  Substance Use Topics   Alcohol use: Yes    Alcohol/week: 7.0 standard drinks of alcohol    Types: 7 Glasses of wine per week    Comment: OCCASIONAL   Drug use: Not Currently     Allergies   Bee venom and Cefazolin   Review of Systems Review of Systems  Constitutional:  Negative for chills and fever.  HENT:  Negative for ear pain and sore throat.   Respiratory:  Negative for cough and shortness of breath.   Cardiovascular:  Negative for chest pain and palpitations.  Gastrointestinal:  Negative for diarrhea and vomiting.  Skin:  Positive for rash. Negative for color change.  All other systems reviewed and are negative.    Physical Exam Triage Vital Signs ED Triage Vitals  Enc Vitals Group     BP      Pulse      Resp      Temp      Temp src      SpO2      Weight      Height      Head Circumference      Peak Flow      Pain Score      Pain Loc      Pain Edu?      Excl. in [place]?    No data found.  Updated Vital Signs BP (!) 154/88 (BP Location: Right Arm)   Pulse 91   Temp 98.6 F (37 C) (Oral)   Resp 16   SpO2 96%   Visual Acuity Right Eye Distance:   Left Eye Distance:   Bilateral Distance:    Right Eye Near:   Left Eye Near:    Bilateral Near:     Physical Exam Vitals and nursing note reviewed.  Constitutional:      General: He is not in  acute distress.    Appearance: Normal appearance. He is well-developed. He is not ill-appearing.  HENT:     Mouth/Throat:     Mouth: Mucous membranes are moist.  Cardiovascular:     Rate and Rhythm: Normal rate and regular rhythm.     Heart sounds: Normal heart sounds.  Pulmonary:     Effort: Pulmonary effort is normal. No respiratory distress.     Breath sounds: Normal breath sounds.  Musculoskeletal:     Cervical back: Neck supple.  Skin:    General: Skin is warm and dry.     Findings: Rash present.     Comments: Right arm: Few scattered dime to quarter-sized areas of erythema with central 1-2 mm open wound. No drainage.  One similar appearing lesion on right inner thigh and one similar appearing lesion behind left knee.   Neurological:     Mental Status: He is alert.  Psychiatric:        Mood and Affect: Mood normal.        Behavior: Behavior normal.      UC Treatments / Results  Labs (all labs ordered are listed, but only abnormal results are displayed) Labs Reviewed - No data to display  EKG   Radiology No results found.  Procedures Procedures (including critical care time)  Medications Ordered in UC Medications - No data to display  Initial Impression / Assessment and Plan / UC Course  I have reviewed the triage vital signs and the nursing notes.  Pertinent labs & imaging results that were available during my care of the patient were reviewed by me and considered  in my medical decision making (see chart for details).   Insect bites, Rash.  The rash appears to be mosquito or other insect bites.  Discussed topical anti-itch cream or calamine lotion.  Discussed Zyrtec if needed for itching.  Education provided on insect bites.  Instructed patient to follow-up with his PCP if his symptoms are not improving.  He agrees to plan of care   Final Clinical Impressions(s) / UC Diagnoses   Final diagnoses:  Insect bite, unspecified site, initial encounter  Rash      Discharge Instructions      Use a topical anti-itch such as calamine lotion.  Take Zyrtec as needed for itching. Follow up with your primary care provider if your symptoms are not improving.        ED Prescriptions   None    PDMP not reviewed this encounter.   Sharion Balloon, NP 10/29/21 1130

## 2021-11-01 ENCOUNTER — Inpatient Hospital Stay: Payer: Medicare Other | Admitting: Anesthesiology

## 2021-11-01 ENCOUNTER — Other Ambulatory Visit: Payer: Self-pay

## 2021-11-01 ENCOUNTER — Inpatient Hospital Stay: Payer: Medicare Other

## 2021-11-01 ENCOUNTER — Encounter: Payer: Self-pay | Admitting: Orthopedic Surgery

## 2021-11-01 ENCOUNTER — Inpatient Hospital Stay: Payer: Medicare Other | Admitting: Urgent Care

## 2021-11-01 ENCOUNTER — Inpatient Hospital Stay
Admission: RE | Admit: 2021-11-01 | Discharge: 2021-11-02 | DRG: 489 | Disposition: A | Discharge status: Home Health | Payer: Medicare Other | Attending: Orthopedic Surgery | Admitting: Orthopedic Surgery

## 2021-11-01 ENCOUNTER — Encounter
Admission: RE | Disposition: A | Discharge status: Home Health | Payer: Self-pay | Source: Home / Self Care | Attending: Orthopedic Surgery

## 2021-11-01 DIAGNOSIS — Z87891 Personal history of nicotine dependence: Secondary | ICD-10-CM

## 2021-11-01 DIAGNOSIS — N400 Enlarged prostate without lower urinary tract symptoms: Secondary | ICD-10-CM | POA: Diagnosis present

## 2021-11-01 DIAGNOSIS — Z807 Family history of other malignant neoplasms of lymphoid, hematopoietic and related tissues: Secondary | ICD-10-CM

## 2021-11-01 DIAGNOSIS — E785 Hyperlipidemia, unspecified: Secondary | ICD-10-CM | POA: Diagnosis present

## 2021-11-01 DIAGNOSIS — E669 Obesity, unspecified: Secondary | ICD-10-CM | POA: Diagnosis present

## 2021-11-01 DIAGNOSIS — Y792 Prosthetic and other implants, materials and accessory orthopedic devices associated with adverse incidents: Secondary | ICD-10-CM | POA: Diagnosis present

## 2021-11-01 DIAGNOSIS — E1142 Type 2 diabetes mellitus with diabetic polyneuropathy: Secondary | ICD-10-CM | POA: Diagnosis present

## 2021-11-01 DIAGNOSIS — J309 Allergic rhinitis, unspecified: Secondary | ICD-10-CM | POA: Diagnosis present

## 2021-11-01 DIAGNOSIS — Z85828 Personal history of other malignant neoplasm of skin: Secondary | ICD-10-CM

## 2021-11-01 DIAGNOSIS — Z825 Family history of asthma and other chronic lower respiratory diseases: Secondary | ICD-10-CM

## 2021-11-01 DIAGNOSIS — Z87442 Personal history of urinary calculi: Secondary | ICD-10-CM | POA: Diagnosis not present

## 2021-11-01 DIAGNOSIS — M659 Synovitis and tenosynovitis, unspecified: Secondary | ICD-10-CM | POA: Diagnosis present

## 2021-11-01 DIAGNOSIS — T8484XA Pain due to internal orthopedic prosthetic devices, implants and grafts, initial encounter: Secondary | ICD-10-CM | POA: Diagnosis present

## 2021-11-01 DIAGNOSIS — Z8571 Personal history of Hodgkin lymphoma: Secondary | ICD-10-CM | POA: Diagnosis not present

## 2021-11-01 DIAGNOSIS — Z96652 Presence of left artificial knee joint: Principal | ICD-10-CM

## 2021-11-01 DIAGNOSIS — T8489XA Other specified complication of internal orthopedic prosthetic devices, implants and grafts, initial encounter: Secondary | ICD-10-CM | POA: Diagnosis present

## 2021-11-01 DIAGNOSIS — M1712 Unilateral primary osteoarthritis, left knee: Secondary | ICD-10-CM | POA: Diagnosis present

## 2021-11-01 DIAGNOSIS — Z833 Family history of diabetes mellitus: Secondary | ICD-10-CM | POA: Diagnosis not present

## 2021-11-01 DIAGNOSIS — G4730 Sleep apnea, unspecified: Secondary | ICD-10-CM | POA: Diagnosis present

## 2021-11-01 DIAGNOSIS — Z79899 Other long term (current) drug therapy: Secondary | ICD-10-CM

## 2021-11-01 DIAGNOSIS — Z9221 Personal history of antineoplastic chemotherapy: Secondary | ICD-10-CM

## 2021-11-01 DIAGNOSIS — Z96642 Presence of left artificial hip joint: Secondary | ICD-10-CM | POA: Diagnosis present

## 2021-11-01 DIAGNOSIS — M2342 Loose body in knee, left knee: Secondary | ICD-10-CM | POA: Diagnosis present

## 2021-11-01 DIAGNOSIS — J449 Chronic obstructive pulmonary disease, unspecified: Secondary | ICD-10-CM | POA: Diagnosis present

## 2021-11-01 DIAGNOSIS — Z823 Family history of stroke: Secondary | ICD-10-CM

## 2021-11-01 DIAGNOSIS — Z6833 Body mass index (BMI) 33.0-33.9, adult: Secondary | ICD-10-CM

## 2021-11-01 DIAGNOSIS — E119 Type 2 diabetes mellitus without complications: Secondary | ICD-10-CM

## 2021-11-01 DIAGNOSIS — I10 Essential (primary) hypertension: Secondary | ICD-10-CM | POA: Diagnosis present

## 2021-11-01 DIAGNOSIS — Z7984 Long term (current) use of oral hypoglycemic drugs: Secondary | ICD-10-CM

## 2021-11-01 DIAGNOSIS — Z01812 Encounter for preprocedural laboratory examination: Secondary | ICD-10-CM

## 2021-11-01 DIAGNOSIS — Z8249 Family history of ischemic heart disease and other diseases of the circulatory system: Secondary | ICD-10-CM | POA: Diagnosis not present

## 2021-11-01 DIAGNOSIS — Z791 Long term (current) use of non-steroidal anti-inflammatories (NSAID): Secondary | ICD-10-CM

## 2021-11-01 DIAGNOSIS — Z85820 Personal history of malignant melanoma of skin: Secondary | ICD-10-CM | POA: Diagnosis not present

## 2021-11-01 HISTORY — PX: TOTAL KNEE REVISION: SHX996

## 2021-11-01 LAB — GLUCOSE, CAPILLARY
Glucose-Capillary: 140 mg/dL — ABNORMAL HIGH (ref 70–99)
Glucose-Capillary: 140 mg/dL — ABNORMAL HIGH (ref 70–99)
Glucose-Capillary: 97 mg/dL (ref 70–99)
Glucose-Capillary: 121 mg/dL — ABNORMAL HIGH (ref 70–99)
Glucose-Capillary: 155 mg/dL — ABNORMAL HIGH (ref 70–99)

## 2021-11-01 LAB — HEMOGLOBIN A1C
Hgb A1c MFr Bld: 6.4 % — ABNORMAL HIGH (ref 4.8–5.6)
Mean Plasma Glucose: 136.98 mg/dL

## 2021-11-01 LAB — CBC
HCT: 40.4 % (ref 39.0–52.0)
Hemoglobin: 12.8 g/dL — ABNORMAL LOW (ref 13.0–17.0)
MCH: 26.8 pg (ref 26.0–34.0)
MCHC: 31.7 g/dL (ref 30.0–36.0)
MCV: 84.7 fL (ref 80.0–100.0)
Platelets: 214 10*3/uL (ref 150–400)
RBC: 4.77 MIL/uL (ref 4.22–5.81)
RDW: 17.2 % — ABNORMAL HIGH (ref 11.5–15.5)
WBC: 6.6 10*3/uL (ref 4.0–10.5)
nRBC: 0 % (ref 0.0–0.2)

## 2021-11-01 LAB — CREATININE, SERUM
Creatinine, Ser: 0.82 mg/dL (ref 0.61–1.24)
GFR, Estimated: >60 mL/min (ref >60)

## 2021-11-01 SURGERY — TOTAL KNEE REVISION
Anesthesia: Spinal | Site: Knee | Laterality: Left

## 2021-11-01 MED ORDER — FENTANYL CITRATE (PF) 100 MCG/2ML IJ SOLN
INTRAMUSCULAR | Status: AC
  Filled 2021-11-01: qty 2

## 2021-11-01 MED ORDER — GABAPENTIN 300 MG PO CAPS
300.0000 mg | ORAL_CAPSULE | Freq: Two times a day (BID) | ORAL | Status: DC
  Administered 2021-11-01: 300 mg via ORAL
  Filled 2021-11-01 (×2): qty 1

## 2021-11-01 MED ORDER — BUPIVACAINE HCL (PF) 0.5 % IJ SOLN
INTRAMUSCULAR | Status: DC | PRN
  Administered 2021-11-01: 3 mL via INTRATHECAL

## 2021-11-01 MED ORDER — OXYCODONE HCL 5 MG PO TABS
10.0000 mg | ORAL_TABLET | ORAL | Status: DC | PRN
  Administered 2021-11-01: 10 mg via ORAL
  Administered 2021-11-01: 15 mg via ORAL
  Filled 2021-11-01: qty 3

## 2021-11-01 MED ORDER — ROSUVASTATIN CALCIUM 10 MG PO TABS
40.0000 mg | ORAL_TABLET | Freq: Every day | ORAL | Status: DC
  Administered 2021-11-01: 40 mg via ORAL
  Filled 2021-11-01: qty 4

## 2021-11-01 MED ORDER — ACETAMINOPHEN 10 MG/ML IV SOLN
INTRAVENOUS | Status: AC
  Filled 2021-11-01: qty 100

## 2021-11-01 MED ORDER — SODIUM CHLORIDE 0.9 % IR SOLN
Status: DC | PRN
  Administered 2021-11-01: 504 mL

## 2021-11-01 MED ORDER — PROPOFOL 500 MG/50ML IV EMUL
INTRAVENOUS | Status: DC | PRN
  Administered 2021-11-01: 120 ug/kg/min via INTRAVENOUS

## 2021-11-01 MED ORDER — NEOMYCIN-POLYMYXIN B GU 40-200000 IR SOLN
Status: AC
  Filled 2021-11-01: qty 20

## 2021-11-01 MED ORDER — MAGNESIUM HYDROXIDE 400 MG/5ML PO SUSP
30.0000 mL | Freq: Every day | ORAL | Status: DC
  Administered 2021-11-01: 30 mL via ORAL
  Filled 2021-11-01: qty 30

## 2021-11-01 MED ORDER — OXYCODONE HCL 5 MG/5ML PO SOLN: 5.0000 mg | Freq: Once | ORAL | Status: DC | PRN

## 2021-11-01 MED ORDER — ALFUZOSIN HCL ER 10 MG PO TB24
10.0000 mg | ORAL_TABLET | Freq: Every day | ORAL | Status: DC
  Filled 2021-11-01: qty 1

## 2021-11-01 MED ORDER — IPRATROPIUM-ALBUTEROL 0.5-2.5 (3) MG/3ML IN SOLN: 3.0000 mL | Freq: Two times a day (BID) | RESPIRATORY_TRACT | Status: DC | PRN

## 2021-11-01 MED ORDER — BUPIVACAINE LIPOSOME 1.3 % IJ SUSP
INTRAMUSCULAR | Status: AC
  Filled 2021-11-01: qty 20

## 2021-11-01 MED ORDER — MORPHINE SULFATE (PF) 10 MG/ML IV SOLN
INTRAVENOUS | Status: AC
  Filled 2021-11-01: qty 1

## 2021-11-01 MED ORDER — METHOCARBAMOL 500 MG PO TABS: 500.0000 mg | ORAL_TABLET | Freq: Four times a day (QID) | ORAL | Status: DC | PRN

## 2021-11-01 MED ORDER — PREGABALIN 50 MG PO CAPS: 50.0000 mg | ORAL_CAPSULE | ORAL | Status: DC

## 2021-11-01 MED ORDER — METHOCARBAMOL 1000 MG/10ML IJ SOLN: 500.0000 mg | Freq: Four times a day (QID) | INTRAVENOUS | Status: DC | PRN

## 2021-11-01 MED ORDER — CLINDAMYCIN PHOSPHATE 900 MG/50ML IV SOLN
INTRAVENOUS | Status: DC | PRN
  Administered 2021-11-01: 900 mg via INTRAVENOUS

## 2021-11-01 MED ORDER — CHLORHEXIDINE GLUCONATE 0.12 % MT SOLN
OROMUCOSAL | Status: AC
  Administered 2021-11-01: 15 mL via OROMUCOSAL
  Filled 2021-11-01: qty 15

## 2021-11-01 MED ORDER — ACETAMINOPHEN 325 MG PO TABS: 325.0000 mg | ORAL_TABLET | Freq: Four times a day (QID) | ORAL | Status: DC | PRN

## 2021-11-01 MED ORDER — ZOLPIDEM TARTRATE 5 MG PO TABS: 5.0000 mg | ORAL_TABLET | Freq: Every evening | ORAL | Status: DC | PRN

## 2021-11-01 MED ORDER — ENOXAPARIN SODIUM 30 MG/0.3ML IJ SOSY
30.0000 mg | PREFILLED_SYRINGE | Freq: Two times a day (BID) | INTRAMUSCULAR | Status: DC
  Administered 2021-11-02: 30 mg via SUBCUTANEOUS
  Filled 2021-11-01: qty 0.3

## 2021-11-01 MED ORDER — BISACODYL 5 MG PO TBEC: 5.0000 mg | DELAYED_RELEASE_TABLET | Freq: Every day | ORAL | Status: DC | PRN

## 2021-11-01 MED ORDER — AZELASTINE HCL 0.1 % NA SOLN
1.0000 | Freq: Two times a day (BID) | NASAL | Status: DC
  Administered 2021-11-01 – 2021-11-02 (×2): 1 via NASAL
  Filled 2021-11-01: qty 30

## 2021-11-01 MED ORDER — VANCOMYCIN HCL IN DEXTROSE 1-5 GM/200ML-% IV SOLN
1000.0000 mg | Freq: Once | INTRAVENOUS | Status: AC
  Administered 2021-11-01: 1000 mg via INTRAVENOUS
  Filled 2021-11-01: qty 200

## 2021-11-01 MED ORDER — HYDROMORPHONE HCL 1 MG/ML IJ SOLN: 0.5000 mg | INTRAMUSCULAR | Status: DC | PRN

## 2021-11-01 MED ORDER — PROPOFOL 1000 MG/100ML IV EMUL
INTRAVENOUS | Status: AC
  Filled 2021-11-01: qty 100

## 2021-11-01 MED ORDER — IPRATROPIUM BROMIDE 0.06 % NA SOLN
2.0000 | Freq: Two times a day (BID) | NASAL | Status: DC
Start: 2021-11-01 — End: 2021-11-02
  Administered 2021-11-01 (×2): 2 via NASAL
  Filled 2021-11-01: qty 15

## 2021-11-01 MED ORDER — FLUTICASONE PROPIONATE 50 MCG/ACT NA SUSP
2.0000 | Freq: Every day | NASAL | Status: DC
  Administered 2021-11-01 – 2021-11-02 (×2): 2 via NASAL
  Filled 2021-11-01: qty 16

## 2021-11-01 MED ORDER — FAMOTIDINE 20 MG PO TABS: 20.0000 mg | ORAL_TABLET | Freq: Once | ORAL | Status: AC

## 2021-11-01 MED ORDER — METFORMIN HCL 500 MG PO TABS
1000.0000 mg | ORAL_TABLET | Freq: Two times a day (BID) | ORAL | Status: DC
  Administered 2021-11-01 – 2021-11-02 (×2): 1000 mg via ORAL
  Filled 2021-11-01 (×2): qty 2

## 2021-11-01 MED ORDER — INSULIN ASPART 100 UNIT/ML IJ SOLN
0.0000 [IU] | Freq: Three times a day (TID) | INTRAMUSCULAR | Status: DC
  Administered 2021-11-01: 2 [IU] via SUBCUTANEOUS
  Administered 2021-11-02: 3 [IU] via SUBCUTANEOUS
  Administered 2021-11-02: 2 [IU] via SUBCUTANEOUS
  Filled 2021-11-01 (×3): qty 1

## 2021-11-01 MED ORDER — ORAL CARE MOUTH RINSE: 15.0000 mL | Freq: Once | OROMUCOSAL | Status: AC

## 2021-11-01 MED ORDER — PHENYLEPHRINE HCL-NACL 20-0.9 MG/250ML-% IV SOLN
INTRAVENOUS | Status: DC | PRN
  Administered 2021-11-01: 10 ug/min via INTRAVENOUS

## 2021-11-01 MED ORDER — TRAMADOL HCL 50 MG PO TABS
50.0000 mg | ORAL_TABLET | Freq: Four times a day (QID) | ORAL | Status: DC
  Administered 2021-11-01 – 2021-11-02 (×5): 50 mg via ORAL
  Filled 2021-11-01 (×5): qty 1

## 2021-11-01 MED ORDER — ONDANSETRON HCL 4 MG/2ML IJ SOLN
4.0000 mg | Freq: Four times a day (QID) | INTRAMUSCULAR | Status: DC | PRN
  Administered 2021-11-02: 4 mg via INTRAVENOUS
  Filled 2021-11-01: qty 2

## 2021-11-01 MED ORDER — OXYCODONE HCL 5 MG PO TABS: 5.0000 mg | ORAL_TABLET | Freq: Once | ORAL | Status: DC | PRN

## 2021-11-01 MED ORDER — RAMIPRIL 10 MG PO CAPS
20.0000 mg | ORAL_CAPSULE | Freq: Every day | ORAL | Status: DC
  Administered 2021-11-02: 20 mg via ORAL
  Filled 2021-11-01: qty 2

## 2021-11-01 MED ORDER — CEFAZOLIN SODIUM-DEXTROSE 2-4 GM/100ML-% IV SOLN: 2.0000 g | INTRAVENOUS | Status: DC

## 2021-11-01 MED ORDER — PROPOFOL 10 MG/ML IV BOLUS
INTRAVENOUS | Status: DC | PRN
  Administered 2021-11-01: 30 mg via INTRAVENOUS

## 2021-11-01 MED ORDER — DUTASTERIDE 0.5 MG PO CAPS
0.5000 mg | ORAL_CAPSULE | Freq: Every day | ORAL | Status: DC
  Filled 2021-11-01: qty 1

## 2021-11-01 MED ORDER — OXYCODONE HCL 5 MG PO TABS
5.0000 mg | ORAL_TABLET | ORAL | Status: DC | PRN
  Filled 2021-11-01: qty 2

## 2021-11-01 MED ORDER — CEFAZOLIN SODIUM-DEXTROSE 2-4 GM/100ML-% IV SOLN
INTRAVENOUS | Status: AC
  Filled 2021-11-01: qty 100

## 2021-11-01 MED ORDER — SODIUM CHLORIDE 0.9 % IV SOLN
INTRAVENOUS | Status: DC
Start: 2021-11-01 — End: 2021-11-02

## 2021-11-01 MED ORDER — SODIUM CHLORIDE 0.9 % IV SOLN: INTRAVENOUS | Status: DC

## 2021-11-01 MED ORDER — ALUM & MAG HYDROXIDE-SIMETH 200-200-20 MG/5ML PO SUSP
30.0000 mL | ORAL | Status: DC | PRN
Start: 2021-11-01 — End: 2021-11-02

## 2021-11-01 MED ORDER — ONDANSETRON HCL 4 MG/2ML IJ SOLN
INTRAMUSCULAR | Status: AC
  Filled 2021-11-01: qty 2

## 2021-11-01 MED ORDER — ONDANSETRON HCL 4 MG PO TABS: 4.0000 mg | ORAL_TABLET | Freq: Four times a day (QID) | ORAL | Status: DC | PRN

## 2021-11-01 MED ORDER — DOCUSATE SODIUM 100 MG PO CAPS
100.0000 mg | ORAL_CAPSULE | Freq: Two times a day (BID) | ORAL | Status: DC
  Administered 2021-11-01 (×2): 100 mg via ORAL
  Filled 2021-11-01 (×3): qty 1

## 2021-11-01 MED ORDER — VANCOMYCIN HCL 1000 MG IV SOLR
INTRAVENOUS | Status: AC
  Filled 2021-11-01: qty 20

## 2021-11-01 MED ORDER — METOPROLOL TARTRATE 25 MG PO TABS
25.0000 mg | ORAL_TABLET | Freq: Two times a day (BID) | ORAL | Status: DC
  Administered 2021-11-01 – 2021-11-02 (×2): 25 mg via ORAL
  Filled 2021-11-01 (×2): qty 1

## 2021-11-01 MED ORDER — DIPHENHYDRAMINE HCL 12.5 MG/5ML PO ELIX: 12.5000 mg | ORAL_SOLUTION | ORAL | Status: DC | PRN

## 2021-11-01 MED ORDER — ACETAMINOPHEN 10 MG/ML IV SOLN
INTRAVENOUS | Status: DC | PRN
  Administered 2021-11-01: 1000 mg via INTRAVENOUS

## 2021-11-01 MED ORDER — FLUTICASONE FUROATE-VILANTEROL 100-25 MCG/ACT IN AEPB
1.0000 | INHALATION_SPRAY | Freq: Every day | RESPIRATORY_TRACT | Status: DC
  Administered 2021-11-01 – 2021-11-02 (×2): 1 via RESPIRATORY_TRACT
  Filled 2021-11-01: qty 28

## 2021-11-01 MED ORDER — PHENOL 1.4 % MT LIQD: 1.0000 | OROMUCOSAL | Status: DC | PRN

## 2021-11-01 MED ORDER — SODIUM CHLORIDE FLUSH 0.9 % IV SOLN
INTRAVENOUS | Status: AC
  Filled 2021-11-01: qty 40

## 2021-11-01 MED ORDER — CHLORHEXIDINE GLUCONATE 0.12 % MT SOLN: 15.0000 mL | Freq: Once | OROMUCOSAL | Status: AC

## 2021-11-01 MED ORDER — BUPIVACAINE-EPINEPHRINE (PF) 0.25% -1:200000 IJ SOLN
INTRAMUSCULAR | Status: AC
  Filled 2021-11-01: qty 30

## 2021-11-01 MED ORDER — PROPOFOL 10 MG/ML IV BOLUS
INTRAVENOUS | Status: AC
  Filled 2021-11-01: qty 20

## 2021-11-01 MED ORDER — FENTANYL CITRATE (PF) 100 MCG/2ML IJ SOLN: 25.0000 ug | INTRAMUSCULAR | Status: DC | PRN

## 2021-11-01 MED ORDER — SENNOSIDES-DOCUSATE SODIUM 8.6-50 MG PO TABS
1.0000 | ORAL_TABLET | Freq: Every evening | ORAL | Status: DC | PRN
Start: 2021-11-01 — End: 2021-11-02

## 2021-11-01 MED ORDER — MIDAZOLAM HCL 5 MG/5ML IJ SOLN
INTRAMUSCULAR | Status: DC | PRN
  Administered 2021-11-01 (×2): 1 mg via INTRAVENOUS

## 2021-11-01 MED ORDER — PHENYLEPHRINE HCL-NACL 20-0.9 MG/250ML-% IV SOLN
INTRAVENOUS | Status: AC
  Filled 2021-11-01: qty 250

## 2021-11-01 MED ORDER — MIDAZOLAM HCL 2 MG/2ML IJ SOLN
INTRAMUSCULAR | Status: AC
  Filled 2021-11-01: qty 2

## 2021-11-01 MED ORDER — LORATADINE 10 MG PO TABS
10.0000 mg | ORAL_TABLET | Freq: Every day | ORAL | Status: DC
  Administered 2021-11-01: 10 mg via ORAL
  Filled 2021-11-01 (×2): qty 1

## 2021-11-01 MED ORDER — CLINDAMYCIN PHOSPHATE 900 MG/50ML IV SOLN
INTRAVENOUS | Status: AC
  Filled 2021-11-01: qty 50

## 2021-11-01 MED ORDER — SODIUM CHLORIDE 0.9 % IR SOLN
Status: DC | PRN
  Administered 2021-11-01: 3012 mL

## 2021-11-01 MED ORDER — BUPIVACAINE-EPINEPHRINE 0.25% -1:200000 IJ SOLN
INTRAMUSCULAR | Status: DC | PRN
  Administered 2021-11-01: 30 mL

## 2021-11-01 MED ORDER — MONTELUKAST SODIUM 10 MG PO TABS
10.0000 mg | ORAL_TABLET | Freq: Every day | ORAL | Status: DC
  Administered 2021-11-01: 10 mg via ORAL
  Filled 2021-11-01: qty 1

## 2021-11-01 MED ORDER — FAMOTIDINE 20 MG PO TABS
ORAL_TABLET | ORAL | Status: AC
  Administered 2021-11-01: 20 mg via ORAL
  Filled 2021-11-01: qty 1

## 2021-11-01 MED ORDER — MENTHOL 3 MG MT LOZG
1.0000 | LOZENGE | OROMUCOSAL | Status: DC | PRN
Start: 2021-11-01 — End: 2021-11-02

## 2021-11-01 SURGICAL SUPPLY — 62 items
BLADE SAW 90X13X1.19 OSCILLAT (BLADE) ×1 IMPLANT
BLADE SAW 90X25X1.19 OSCILLAT (BLADE) ×1 IMPLANT
BNDG ELASTIC 6X5.8 VLCR STR LF (GAUZE/BANDAGES/DRESSINGS) ×2 IMPLANT
CANISTER WOUND CARE 500ML ATS (WOUND CARE) ×2 IMPLANT
CHLORAPREP W/TINT 26 (MISCELLANEOUS) ×4 IMPLANT
COOLER POLAR GLACIER W/PUMP (MISCELLANEOUS) ×2 IMPLANT
COVER BACK TABLE REUSABLE LG (DRAPES) ×2 IMPLANT
CUFF TOURN SGL QUICK 24 (TOURNIQUET CUFF)
CUFF TOURN SGL QUICK 34 (TOURNIQUET CUFF)
CUFF TRNQT CYL 24X4X16.5-23 (TOURNIQUET CUFF) IMPLANT
CUFF TRNQT CYL 34X4.125X (TOURNIQUET CUFF) IMPLANT
DRAPE 3/4 80X56 (DRAPES) ×8 IMPLANT
ELECT CAUTERY BLADE 6.4 (BLADE) ×2 IMPLANT
ELECT REM PT RETURN 9FT ADLT (ELECTROSURGICAL) ×2
ELECTRODE REM PT RTRN 9FT ADLT (ELECTROSURGICAL) ×1 IMPLANT
GAUZE SPONGE 4X4 12PLY STRL (GAUZE/BANDAGES/DRESSINGS) ×2 IMPLANT
GAUZE XEROFORM 1X8 LF (GAUZE/BANDAGES/DRESSINGS) ×2 IMPLANT
GLOVE BIOGEL PI IND STRL 9 (GLOVE) ×1 IMPLANT
GLOVE BIOGEL PI INDICATOR 9 (GLOVE) ×1
GLOVE SURG ORTHO 8.0 STRL STRW (GLOVE) ×2 IMPLANT
GLOVE SURG SYN 9.0  PF PI (GLOVE) ×1
GLOVE SURG SYN 9.0 PF PI (GLOVE) ×1 IMPLANT
GLOVE SURG UNDER LTX SZ8 (GLOVE) ×2 IMPLANT
GOWN SRG 2XL LVL 4 RGLN SLV (GOWNS) ×1 IMPLANT
GOWN STRL NON-REIN 2XL LVL4 (GOWNS) ×1
GOWN STRL REUS W/ TWL LRG LVL3 (GOWN DISPOSABLE) ×1 IMPLANT
GOWN STRL REUS W/ TWL XL LVL3 (GOWN DISPOSABLE) ×1 IMPLANT
GOWN STRL REUS W/TWL LRG LVL3 (GOWN DISPOSABLE) ×1
GOWN STRL REUS W/TWL XL LVL3 (GOWN DISPOSABLE) ×1
HOLDER FOLEY CATH W/STRAP (MISCELLANEOUS) ×2 IMPLANT
HOOD PEEL AWAY FLYTE STAYCOOL (MISCELLANEOUS) ×4 IMPLANT
IV NS IRRIG 3000ML ARTHROMATIC (IV SOLUTION) ×2 IMPLANT
KIT PREVENA INCISION MGT20CM45 (CANNISTER) ×2 IMPLANT
KIT TURNOVER KIT A (KITS) ×2 IMPLANT
KNEE TIBIAL INSERT FXD 12MM S5 (Insert) ×1 IMPLANT
KNIFE SCULPS 14X20 (INSTRUMENTS) ×2 IMPLANT
MANIFOLD NEPTUNE II (INSTRUMENTS) ×2 IMPLANT
NDL SPNL 18GX3.5 QUINCKE PK (NEEDLE) ×1 IMPLANT
NDL SPNL 20GX3.5 QUINCKE YW (NEEDLE) ×1 IMPLANT
NEEDLE SPNL 18GX3.5 QUINCKE PK (NEEDLE) ×2 IMPLANT
NEEDLE SPNL 20GX3.5 QUINCKE YW (NEEDLE) ×2 IMPLANT
NS IRRIG 1000ML POUR BTL (IV SOLUTION) ×2 IMPLANT
PACK TOTAL KNEE (MISCELLANEOUS) ×2 IMPLANT
PAD WRAPON POLAR KNEE (MISCELLANEOUS) ×2 IMPLANT
PULSAVAC PLUS IRRIG FAN TIP (DISPOSABLE) ×2
SCALPEL PROTECTED #10 DISP (BLADE) ×4 IMPLANT
STAPLER SKIN PROX 35W (STAPLE) ×2 IMPLANT
SUCTION FRAZIER HANDLE 10FR (MISCELLANEOUS) ×1
SUCTION TUBE FRAZIER 10FR DISP (MISCELLANEOUS) ×1 IMPLANT
SUT DVC 2 QUILL PDO  T11 36X36 (SUTURE) ×1
SUT DVC 2 QUILL PDO T11 36X36 (SUTURE) ×1 IMPLANT
SUT TICRON 2-0 30IN 311381 (SUTURE) ×1 IMPLANT
SUT V-LOC 90 ABS DVC 3-0 CL (SUTURE) ×1 IMPLANT
SWAB CULTURE AMIES ANAERIB BLU (MISCELLANEOUS) IMPLANT
SYR 20ML LL LF (SYRINGE) ×2 IMPLANT
SYR 50ML LL SCALE MARK (SYRINGE) ×4 IMPLANT
TIP FAN IRRIG PULSAVAC PLUS (DISPOSABLE) ×1 IMPLANT
TOWEL OR 17X26 4PK STRL BLUE (TOWEL DISPOSABLE) ×2 IMPLANT
TOWER CARTRIDGE SMART MIX (DISPOSABLE) ×1 IMPLANT
TRAY FOLEY MTR SLVR 16FR STAT (SET/KITS/TRAYS/PACK) ×2 IMPLANT
WATER STERILE IRR 500ML POUR (IV SOLUTION) ×2 IMPLANT
WRAPON POLAR PAD KNEE (MISCELLANEOUS) ×4

## 2021-11-01 NOTE — Op Note (Signed)
11/01/2021  9:00 AM  PATIENT:  Austin Sullivan  66 y.o. male  PRE-OPERATIVE DIAGNOSIS:  Status post  total knee arthroplasty, left  [phone] Primary osteoarthritis  of left knee  M17.12 Loose body of left knee  M23.42  POST-OPERATIVE DIAGNOSIS:  Status post  total knee arthroplasty, left  [phone] Primary osteoarthritis  of left knee  M17.12 Loose body of left knee  M23.42  PROCEDURE:  Procedure(s): Left knee revision, polyethylene exchange (Left)  SURGEON: Laurene Footman, MD  ASSISTANTS: [name], PA-C  ANESTHESIA:   spinal  EBL:  Total I/O In: 500 [I.V.:500] Out: 170 [Urine:100; Blood:70]  BLOOD ADMINISTERED:none  DRAINS:  Incisional wound VAC    LOCAL MEDICATIONS USED:  MARCAINE     SPECIMEN:  No Specimen  DISPOSITION OF SPECIMEN:  N/A  COUNTS:  YES  TOURNIQUET:  * Missing tourniquet times found for documented tourniquets in log: [id number] *  IMPLANTS: Medacta 5 left 12 mm PS insert  DICTATION: .Dragon Dictation patient was brought to the operating room and after adequate spinal anesthesia was obtained the left leg was prepped and draped in the usual sterile fashion with a tourniquet applied the upper thigh.  After appropriate patient identification and timeout procedures were completed the prior midline skin incision was utilized.  Medial lateral skin flaps were elevated and the medial parapatellar arthrotomy performed going along the prior incision.  Inspection revealed extensive scarring within the knee with synovitis in the pouch and gutters which had dense scar tissue which was excised and gutters recreated.  The patella was subsequently everted partially to remove scar tissue off the patellar tendon to mobilize it as well as around the patellar component which appeared stable with the knee in extension there was opening to varus stress and after removal of the implant the knee was irrigated and no another loose bodies could be identified posteriorly it appeared to be in  the dense scar tissue.  A 12 mm trial was placed and this gave excellent stability throughout the range of motion.  The knee was again thoroughly irrigated with pulse lavage and 30 cc of half percent Sensorcaine infiltrated the periarticular tissues.  The polyethylene implant was then impacted into position with setscrew and normal tracking of the patella.  The arthrotomy was repaired using a heavy Quill followed by 3 oh V-Loc subcuticular skin staples and incisional wound VAC.  PLAN OF CARE: Admit to inpatient   PATIENT DISPOSITION:  PACU - hemodynamically stable.

## 2021-11-01 NOTE — Transfer of Care (Signed)
Immediate Anesthesia Transfer of Care Note  Patient: Lenton Gendreau  Procedure(s) Performed: Left knee revision, polyethylene exchange (Left: Knee)  Patient Location: PACU  Anesthesia Type:Spinal  Level of Consciousness: awake and drowsy  Airway & Oxygen Therapy: Patient Spontanous Breathing and Patient connected to face mask oxygen  Post-op Assessment: Report given to RN and Post -op Vital signs reviewed and stable  Post vital signs: Reviewed  Last Vitals:  Vitals Value Taken Time  BP    Temp    Pulse 80 11/01/21 0859  Resp    SpO2 97 % 11/01/21 0859  Vitals shown include unvalidated device data.  Last Pain:  Vitals:   11/01/21 0616  TempSrc: Tympanic  PainSc: 0-No pain         Complications: No notable events documented.

## 2021-11-01 NOTE — Progress Notes (Signed)
Met with the patient at the bedside, he is set up with [facility] for [facility], He has DME at home and does not need additional, His wife provides transportation

## 2021-11-01 NOTE — TOC Progression Note (Signed)
Transition of Care [facility]) - Progression Note    Patient Details  Name: Austin Sullivan MRN: [id number] Date of Birth: [birth year]  Transition of Care [facility] CM/SW [place], RN Phone Number: 11/01/2021, 11:12 AM  Clinical Narrative:    Patient accepted by [facility] for [facility] services prior to surgery        Expected Discharge Plan and Services                                                 Social Determinants of Health (SDOH) Interventions    Readmission Risk Interventions     No data to display

## 2021-11-01 NOTE — Evaluation (Signed)
Physical Therapy Evaluation Patient Details Name: Austin Sullivan MRN: [id number] DOB: 1956-03-26 Today's Date: 11/01/2021  History of Present Illness  Patient is a 66 year old male with Primary osteoarthritis  of left knee and loose body of left knee. s/p L knee revision, polyethylene exchange.  Medical history of COPD, diabetes, IBS, peripheral neuropathy, hodgkin's lymphoma, melanoma, vocal cord spacer  Clinical Impression  Patient is agreeable to PT evaluation. He reports having pain and swelling in the left knee but was ambulatory without assistive device prior to surgery. He lives with his spouse in a single story home.  The patient was already up in the recliner chair on arrival to the room today. He was able to stand and ambulate in the room with the rolling walker with occasional cues for safety. No significant increase in pain reported with mobility. Recommend to continue PT to maximize independence and facilitate return to prior level of function. Recommend HHPT at this time.      Recommendations for follow up therapy are one component of a multi-disciplinary discharge planning process, led by the attending physician.  Recommendations may be updated based on patient status, additional functional criteria and insurance authorization.  Follow Up Recommendations Home health PT      Assistance Recommended at Discharge Set up Supervision/Assistance  Patient can return home with the following  Assist for transportation;Help with stairs or ramp for entrance    Equipment Recommendations None recommended by PT  Recommendations for Other Services       Functional Status Assessment Patient has had a recent decline in their functional status and demonstrates the ability to make significant improvements in function in a reasonable and predictable amount of time.     Precautions / Restrictions Precautions Precautions: Knee;Fall Precaution Booklet Issued: Yes (comment) Restrictions Weight  Bearing Restrictions: Yes LLE Weight Bearing: Weight bearing as tolerated      Mobility  Bed Mobility               General bed mobility comments: not assessed as patient sitting up on arrival and post session    Transfers Overall transfer level: Needs assistance Equipment used: Rolling walker (2 wheels) Transfers: Sit to/from Stand Sit to Stand: Min assist           General transfer comment: verbal cues for hand placement for safety. patient demonstrated correct positioning of LLE for comfort without cues with sitting    Ambulation/Gait Ambulation/Gait assistance: Min guard Gait Distance (Feet): 30 Feet Assistive device: Rolling walker (2 wheels) Gait Pattern/deviations: Step-to pattern, Decreased stride length, Decreased stance time - left Gait velocity: decreased     General Gait Details: patient ambulated in the room with rolling walker with occasional safety cues. mild dizziness reported with upright activity.  Stairs            Wheelchair Mobility    Modified Rankin (Stroke Patients Only)       Balance Overall balance assessment: Needs assistance Sitting-balance support: Feet supported Sitting balance-Leahy Scale: Good     Standing balance support: Bilateral upper extremity supported, Reliant on assistive device for balance Standing balance-Leahy Scale: Fair Standing balance comment: with rolling walker for UE support in standing                             Pertinent Vitals/Pain Pain Assessment Pain Assessment: 0-10 Pain Score: 2  Pain Location: L knee Pain Descriptors / Indicators: Sore Pain Intervention(s): Limited activity within  patient's tolerance, Monitored during session (polar care re-applied at end of session)    Home Living Family/patient expects to be discharged to:: Private residence Living Arrangements: Spouse/significant other Available Help at Discharge: Family Type of Home: House Home Access: Stairs to  enter   [employer] of Steps: 1 small step to enter   Home Layout: One level [place]: Conservation officer, nature (2 wheels);Crutches;Cane - single point;BSC/3in1      Prior Function Prior Level of Function : Independent/Modified Independent                     Hand Dominance        Extremity/Trunk Assessment   Upper Extremity Assessment Upper Extremity Assessment: Overall WFL for tasks assessed    Lower Extremity Assessment Lower Extremity Assessment: LLE deficits/detail LLE Deficits / Details: patient able to SLR x 5. patient is able to activate hip/knee/ankle movement. weight bearing without knee buckling LLE Sensation: WNL       Communication   Communication: No difficulties  Cognition Arousal/Alertness: Awake/alert Behavior During Therapy: WFL for tasks assessed/performed Overall Cognitive Status: Within Functional Limits for tasks assessed                                 General Comments: patient is able to follow all commands without difficulty        General Comments      Exercises Total Joint Exercises Straight Leg Raises: AROM, Strengthening, Left, 5 reps, Seated Goniometric ROM: L knee 9-84 degrees   Assessment/Plan    PT Assessment Patient needs continued PT services  PT Problem List Decreased strength;Decreased range of motion;Decreased activity tolerance;Decreased balance;Decreased mobility;Pain;Decreased knowledge of precautions       PT Treatment Interventions DME instruction;Gait training;Stair training;Functional mobility training;Therapeutic activities;Therapeutic exercise;Balance training;Neuromuscular re-education;Patient/family education    PT Goals (Current goals can be found in the Care Plan section)  Acute Rehab PT Goals Patient Stated Goal: to return home PT Goal Formulation: With patient Time For Goal Achievement: 11/15/21 Potential to Achieve Goals: Good    Frequency BID     Co-evaluation                AM-PAC PT "6 Clicks" Mobility  Outcome Measure Help needed turning from your back to your side while in a flat bed without using bedrails?: None Help needed moving from lying on your back to sitting on the side of a flat bed without using bedrails?: A Little Help needed moving to and from a bed to a chair (including a wheelchair)?: A Little Help needed standing up from a chair using your arms (e.g., wheelchair or bedside chair)?: A Little Help needed to walk in hospital room?: A Little Help needed climbing 3-5 steps with a railing? : A Little 6 Click Score: 19    End of Session   Activity Tolerance: Patient tolerated treatment well Patient left: in chair;with call bell/phone within reach;with family/visitor present;with SCD's reapplied (polar care L knee. L knee extended with bone foam in place) Nurse Communication: Mobility status PT Visit Diagnosis: Unsteadiness on feet (R26.81);Other abnormalities of gait and mobility (R26.89)    Time: 0998-3382 PT Time Calculation (min) (ACUTE ONLY): 18 min   Charges:   PT Evaluation $PT Eval Low Complexity: 1 Low PT Treatments $Gait Training: 8-22 mins        Minna Merritts, PT, MPT   Percell Locus 11/01/2021, 2:30 PM

## 2021-11-01 NOTE — Anesthesia Preprocedure Evaluation (Signed)
Anesthesia Evaluation  Patient identified by MRN, date of birth, ID band Patient awake    Reviewed: Allergy & Precautions, NPO status , Patient's Chart, lab work & pertinent test results  History of Anesthesia Complications Negative for: history of anesthetic complications  Airway Mallampati: III  TM Distance: <3 FB Neck ROM: full    Dental  (+) Chipped, Poor Dentition   Pulmonary shortness of breath and with exertion, asthma , sleep apnea and Continuous Positive Airway Pressure Ventilation , COPD, former smoker,    Pulmonary exam normal        Cardiovascular Exercise Tolerance: Good hypertension, (-) angina(-) DOE Normal cardiovascular exam     Neuro/Psych  Neuromuscular disease negative psych ROS   GI/Hepatic negative GI ROS, Neg liver ROS, neg GERD  ,  Endo/Other  diabetes, Type 2  Renal/GU Renal disease     Musculoskeletal   Abdominal   Peds  Hematology negative hematology ROS (+)   Anesthesia Other Findings Past Medical History: No date: Arthritis 01/30/2019: Basal cell carcinoma     Comment:  Left ear, superior helix. Nodular and infiltrative               patterns. Simple excision/EDC. 11/03/2019: Basal cell carcinoma     Comment:  left lat brow 06/30/2021: Basal cell carcinoma     Comment:  left forehead above lat brow, schedule Mohs No date: BPH (benign prostatic hyperplasia) No date: Chemotherapy management, encounter for     Comment:  FOR hodgkins lymphoma. in remission now No date: COPD (chronic obstructive pulmonary disease) (HCC) No date: Diabetes mellitus without complication (HCC) No date: Dyspnea     Comment:  WITH EXCERCISE No date: History of kidney stones No date: Hodgkin's lymphoma ([place])     Comment:  chemotherapy. now in remission No date: Hypertension No date: Irritable bowel syndrome with constipation 2001: Melanoma ([place])     Comment:  Right forehead. Metastatic melanoma 2016 No  date: Peripheral neuropathy 07/2020: Sepsis ([place])     Comment:  developed stones after TKR and was readmit to hospital               for this No date: Sleep apnea     Comment:  USES CPAP No date: Vocal cord paralysis     Comment:  wedge placed on cords so he can speak  Past Surgical History: No date: bph No date: COLONOSCOPY 07/26/2020: CYSTOSCOPY WITH STENT PLACEMENT; Bilateral     Comment:  Procedure: CYSTOSCOPY WITH STENT PLACEMENT;  Surgeon:               Billey Co, MD;  Location: [facility] OR[facility];  Service:               Urology;  Laterality: Bilateral; 08/27/2020: CYSTOSCOPY/URETEROSCOPY/HOLMIUM LASER/STENT PLACEMENT;  Bilateral     Comment:  Procedure: CYSTOSCOPY/URETEROSCOPY/HOLMIUM LASER/STENT               PLACEMENT;  Surgeon: Billey Co, MD;  Location:               [facility];  Service: Urology;  Laterality: Bilateral; No date: EYE SURGERY     Comment:  carterat surgery x 2 No date: FRACTURE SURGERY     Comment:  both wrist ahs metal plates No date: JOINT REPLACEMENT     Comment:  left hip, LEFT  KNEE No date: nasal cavity surgery     Comment:  septoplasty No date: ORCHIECTOMY     Comment:  only 1  testicle removed d/t lump on that side 2017: port a cath place 2022: removed port a cath No date: [place]; Left 07/20/2020: TOTAL KNEE ARTHROPLASTY; Left     Comment:  Procedure: TOTAL KNEE ARTHROPLASTY - [name] to               Assist;  Surgeon: Hessie Knows, MD;  Location: [facility] OR[facility];              Service: Orthopedics;  Laterality: Left; No date: trachostomy 2017: vocal cord spacer No date: WRIST SURGERY; Bilateral     Comment:  PLATES AND SCREWS  BMI    Body Mass Index: 33.27 kg/m      Reproductive/Obstetrics negative OB ROS                             Anesthesia Physical Anesthesia Plan  ASA: 3  Anesthesia Plan: Spinal   Post-op Pain Management:    Induction:   PONV Risk Score and Plan:   Airway  Management Planned: Natural Airway and Nasal Cannula  Additional Equipment:   Intra-op Plan:   Post-operative Plan:   Informed Consent: I have reviewed the patients History and Physical, chart, labs and discussed the procedure including the risks, benefits and alternatives for the proposed anesthesia with the patient or authorized representative who has indicated his/her understanding and acceptance.     Dental Advisory Given  Plan Discussed with: Anesthesiologist, CRNA and Surgeon  Anesthesia Plan Comments: (Patient reports no bleeding problems and no anticoagulant use.  Plan for spinal with backup GA  Patient consented for risks of anesthesia including but not limited to:  - adverse reactions to medications - damage to eyes, teeth, lips or other oral mucosa - nerve damage due to positioning  - risk of bleeding, infection and or nerve damage from spinal that could lead to paralysis - risk of headache or failed spinal - damage to teeth, lips or other oral mucosa - sore throat or hoarseness - damage to heart, brain, nerves, lungs, other parts of body or loss of life  Patient voiced understanding.)        Anesthesia Quick Evaluation

## 2021-11-01 NOTE — Anesthesia Procedure Notes (Signed)
Spinal  Patient location during procedure: OR Start time: 11/01/2021 7:30 AM End time: 11/01/2021 7:33 AM Reason for block: surgical anesthesia Staffing Anesthesiologist: Piscitello, Precious Haws, MD Other anesthesia staff: Daryel Gerald, RN Performed by: Rolla Plate, CRNA Authorized by: Andria Frames, MD   Preanesthetic Checklist Completed: patient identified, IV checked, site marked, risks and benefits discussed, surgical consent, monitors and equipment checked, pre-op evaluation and timeout performed Spinal Block Patient position: sitting Prep: Betadine Patient monitoring: heart rate, continuous pulse ox, blood pressure and cardiac monitor Approach: midline Location: L4-5 Injection technique: single-shot Needle Needle type: Whitacre and Introducer  Needle gauge: 24 G Needle length: 9 cm Assessment Sensory level: T4 Events: CSF return Additional Notes Negative paresthesia. Negative blood return. Positive free-flowing CSF. Expiration date of kit checked and confirmed. Patient tolerated procedure well, without complications.

## 2021-11-01 NOTE — H&P (Signed)
Chief Complaint  Patient presents with  Left Knee - Follow-up, Pain    History of the Present Illness: Austin Sullivan is a 66 y.o. male here today.   The patient presents for follow-up evaluation status post left total knee arthroplasty in the past. He still has swelling and pain in the knee and wanted to recheck. He had x-rays on 07/15/2021 that showed normal alignment. He is having persistent swelling and pain. He had some loose bodies in the posterior knee. He is to follow up with a knee sleeve and see if that was helping at all.  The patient states his left knee feels like a "peg leg". He states his left knee is painful at times, and other times it is not quite so bad depending on what he is trying to do. He states he has seen Dr. Baldemar Lenis, and he was told to take meloxicam as needed for pain. The patient states he has been taking meloxicam regularly. He states he is getting plenty of exercise because he is walking his dogs at least once a day.  The patient states he has other health issues. He states his back gave out a couple of weeks ago when he was walking his dogs. He bent over to pick up their deposit that left on the ground for him, and he instantly could not move.  I have reviewed past medical, surgical, social and family history, and allergies as documented in the EMR.  Past Medical History: Past Medical History:  Diagnosis Date  Allergic rhinitis due to allergen 06/09/1970  Allergy  Asthma, unspecified asthma severity, unspecified whether complicated, unspecified whether persistent  COPD (chronic obstructive pulmonary disease) (CMS-HCC)  Dermatitis 08/10/1997  Diabetes mellitus without complication (CMS-HCC)  History of cancer  melanoma  Hyperlipidemia  Hypertension  Obesity  Sleep apnea   Past Surgical History: Past Surgical History:  Procedure Laterality Date  JOINT REPLACEMENT Left 07/20/2020  Knee-Ichelle Harral  CATARACT EXTRACTION  Deviated septum  Melanoma  right side  forehead  Spacer in throat  Testical removed Left  Torn retina Bilateral   Past Family History: Family History  Problem Relation Age of Onset  Diabetes type II Mother  Myocardial Infarction (Heart attack) Mother  Stroke Mother  Allergies Mother  Myocardial Infarction (Heart attack) Father  High blood pressure (Hypertension) Sister  Stroke Brother  Diabetes type II Brother  Lymphoma Brother  Cancer Brother  Myocardial Infarction (Heart attack) Maternal Grandmother  Myocardial Infarction (Heart attack) Maternal Grandfather  Dementia Paternal Grandmother  High blood pressure (Hypertension) Sister  Asthma Sister  COPD Sister   Medications: Current Outpatient Medications Ordered in Epic  Medication Sig Dispense Refill  alfuzosin (UROXATRAL) 10 mg ER tablet TAKE 1 TABLET BY MOUTH EVERY DAY 90 tablet 1  azelastine (ASTELIN) 137 mcg nasal spray PLACE 1 SPRAY INTO BOTH NOSTRILS 2 (TWO) TIMES DAILY 90 mL 3  BREO ELLIPTA 100-25 mcg/dose DsDv inhaler USE 1 INHALATION ORALLY ONCE DAILY 180 g 3  dutasteride (AVODART) 0.5 mg capsule TAKE 1 CAPSULE (0.5 MG TOTAL) BY MOUTH ONCE DAILY 90 capsule 3  fluticasone propionate (FLONASE) 50 mcg/actuation nasal spray SPRAY 2 SPRAYS INTO EACH NOSTRIL EVERY DAY 48 g 3  gabapentin (NEURONTIN) 300 MG capsule Take 1 capsule (300 mg total) by mouth 2 (two) times daily 180 capsule 1  ipratropium (ATROVENT) 0.06 % nasal spray Place 2 sprays into both nostrils 3 (three) times daily as needed for Rhinitis 15 mL 2  ipratropium-albuteroL (DUO-NEB) nebulizer solution TAKE 3 MLS BY  NEBULIZATION 2 (TWO) TIMES DAILY AS NEEDED FOR SHORTNESS OF BREATH FOR UP TO 360 DAYS 540 mL 1  meloxicam (MOBIC) 15 MG tablet TAKE 1 TABLET BY MOUTH EVERY DAY 30 tablet 1  metFORMIN (GLUCOPHAGE) 1000 MG tablet TAKE 1 TABLET BY MOUTH TWICE A DAY WITH MEALS 180 tablet 1  metoprolol tartrate (LOPRESSOR) 25 MG tablet TAKE 1 TABLET BY MOUTH EVERY DAY 90 tablet 1  montelukast (SINGULAIR) 10 mg  tablet TAKE 1 TABLET BY MOUTH EVERY DAY AT NIGHT 90 tablet 3  pregabalin (LYRICA) 50 MG capsule TAKE 1 CAPSULE WITH EACH MEAL, THEN 2 CAPSULES AT BEDTIME 450 capsule 1  ramipriL (ALTACE) 10 MG capsule TAKE 2 CAPSULES (20 MG TOTAL) BY MOUTH ONCE DAILY 180 capsule 1  rosuvastatin (CRESTOR) 40 MG tablet TAKE 1 TABLET BY MOUTH EVERY DAY 90 tablet 3   No current Epic-ordered facility-administered medications on file.   Allergies: Allergies  Allergen Reactions  Venom-Honey Bee Swelling  Ancef [Cefazolin] Rash  Bee Sting Kit Hives    Body mass index is 33.04 kg/m.  Review of Systems: A comprehensive 14 point ROS was performed, reviewed, and the pertinent orthopaedic findings are documented in the HPI.  Vitals:  10/14/21 1010  BP: 118/64    General Physical Examination:   General/Constitutional: No apparent distress: well-nourished and well developed. Eyes: Pupils equal, round with synchronous movement. Lungs: Clear to auscultation HEENT: Normal Vascular: No edema, swelling or tenderness, except as noted in detailed exam. Cardiac: Heart rate and rhythm is regular. Integumentary: No impressive skin lesions present, except as noted in detailed exam. Neuro/Psych: Normal mood and affect, oriented to person, place and time.  On exam, right quadriceps strength. Right knee instability. Left knee effusion. Good left hip range of motion.  Radiographs:  No new imaging studies were obtained today.  Assessment: ICD-10-CM  1. S/P total knee arthroplasty, left Z96.652  2. Primary osteoarthritis of left knee M17.12  3. Loose body of left knee M23.42   Plan:  The patient has clinical findings of left knee pain status post left total knee arthroplasty.  We discussed the patient's prior x-ray findings. I explained he has developed some instability of his left knee. I recommend left total knee arthroplasty in the next 6 weeks. I advised him to avoid using brace. We discussed the risks and  benefits of surgery. I explained the surgery and postoperative course in detail.  We will schedule the patient for left total knee arthroplasty in the near future.  Surgical Risks:  The nature of the condition and the proposed procedure has been reviewed in detail with the patient. Surgical versus non-surgical options and prognosis for recovery have been reviewed and the inherent risks and benefits of each have been discussed including the risks of infection, bleeding, injury to nerves/blood vessels/tendons, incomplete relief of symptoms, persisting pain and/or stiffness, loss of function, complex regional pain syndrome, failure of the procedure, as appropriate.  Document Attestation: Pincus Sanes, have reviewed and updated documentation for [facility], MD, utilizing [employer] DAX.    Electronically signed by Lauris Poag, MD at 10/15/2021 6:21 AM EDT  Reviewed  H+P. No changes noted.

## 2021-11-02 ENCOUNTER — Encounter: Payer: Self-pay | Admitting: Orthopedic Surgery

## 2021-11-02 LAB — CBC
HCT: 43 % (ref 39.0–52.0)
Hemoglobin: 13.5 g/dL (ref 13.0–17.0)
MCH: 26.5 pg (ref 26.0–34.0)
MCHC: 31.4 g/dL (ref 30.0–36.0)
MCV: 84.5 fL (ref 80.0–100.0)
Platelets: 222 10*3/uL (ref 150–400)
RBC: 5.09 MIL/uL (ref 4.22–5.81)
RDW: 17.4 % — ABNORMAL HIGH (ref 11.5–15.5)
WBC: 10 10*3/uL (ref 4.0–10.5)
nRBC: 0 % (ref 0.0–0.2)

## 2021-11-02 LAB — GLUCOSE, CAPILLARY
Glucose-Capillary: 122 mg/dL — ABNORMAL HIGH (ref 70–99)
Glucose-Capillary: 162 mg/dL — ABNORMAL HIGH (ref 70–99)
Glucose-Capillary: 240 mg/dL — ABNORMAL HIGH (ref 70–99)

## 2021-11-02 LAB — BASIC METABOLIC PANEL
Anion gap: 7 (ref 5–15)
BUN: 14 mg/dL (ref 8–23)
CO2: 24 mmol/L (ref 22–32)
Calcium: 8.8 mg/dL — ABNORMAL LOW (ref 8.9–10.3)
Chloride: 108 mmol/L (ref 98–111)
Creatinine, Ser: 0.72 mg/dL (ref 0.61–1.24)
GFR, Estimated: >60 mL/min (ref >60)
Glucose, Bld: 159 mg/dL — ABNORMAL HIGH (ref 70–99)
Potassium: 4 mmol/L (ref 3.5–5.1)
Sodium: 139 mmol/L (ref 135–145)

## 2021-11-02 MED ORDER — SENNOSIDES-DOCUSATE SODIUM 8.6-50 MG PO TABS: 1.0000 | ORAL_TABLET | Freq: Every evening | ORAL | Status: DC | PRN

## 2021-11-02 MED ORDER — ENOXAPARIN SODIUM 40 MG/0.4ML IJ SOSY: 40.0000 mg | PREFILLED_SYRINGE | INTRAMUSCULAR | 0 refills | Status: DC

## 2021-11-02 MED ORDER — TRAMADOL HCL 50 MG PO TABS: 50.0000 mg | ORAL_TABLET | Freq: Four times a day (QID) | ORAL | 0 refills | Status: DC

## 2021-11-02 MED ORDER — METHOCARBAMOL 500 MG PO TABS: 500.0000 mg | ORAL_TABLET | Freq: Four times a day (QID) | ORAL | 0 refills | Status: DC | PRN

## 2021-11-02 MED ORDER — OXYCODONE HCL 5 MG PO TABS
5.0000 mg | ORAL_TABLET | ORAL | 0 refills | Status: DC | PRN
Start: 2021-11-02 — End: 2022-03-16

## 2021-11-02 MED ORDER — ONDANSETRON HCL 4 MG PO TABS: 4.0000 mg | ORAL_TABLET | Freq: Four times a day (QID) | ORAL | 0 refills | Status: DC | PRN

## 2021-11-02 NOTE — Progress Notes (Signed)
   Subjective: 1 Day Post-Op Procedure(s) (LRB): Left knee revision, polyethylene exchange (Left) Patient reports pain as mild.   Patient is well, and has had no acute complaints or problems Denies any CP, SOB, ABD pain. We will continue therapy today.  Plan is to go Home after hospital stay.  Objective: Vital signs in last 24 hours: Temp:  [96.8 F (36 C)-98 F (36.7 C)] 97.7 F (36.5 C) (08/02 0330) Pulse Rate:  [73-96] 93 (08/02 0330) Resp:  [9-17] 17 (08/02 0330) BP: (118-176)/(60-75) 145/66 (08/02 0330) SpO2:  [93 %-100 %] 95 % (08/02 0330)  Intake/Output from previous day: 08/01 0701 - 08/02 0700 In: 1832.3 [I.V.:1632.3; IV Piggyback:200] Out: 1595 [Urine:1525; Blood:70] Intake/Output this shift: No intake/output data recorded.  Recent Labs    11/01/21 1256  HGB 12.8*   Recent Labs    11/01/21 1256  WBC 6.6  RBC 4.77  HCT 40.4  PLT 214   Recent Labs    11/01/21 1256  CREATININE 0.82   No results for input(s): "LABPT", "INR" in the last 72 hours.  EXAM General - Patient is Alert, Appropriate, and Oriented Extremity - Sensation intact distally Intact pulses distally Dorsiflexion/Plantar flexion intact Dressing - dressing C/D/I and no drainage, provena intact with out drainage Motor Function - intact, moving foot and toes well on exam.   Past Medical History:  Diagnosis Date   Arthritis    Basal cell carcinoma 01/30/2019   Left ear, superior helix. Nodular and infiltrative patterns. Simple excision/EDC.   Basal cell carcinoma 11/03/2019   left lat brow   Basal cell carcinoma 06/30/2021   left forehead above lat brow, schedule Mohs   BPH (benign prostatic hyperplasia)    Chemotherapy management, encounter for    FOR hodgkins lymphoma. in remission now   COPD (chronic obstructive pulmonary disease) (HCC)    Diabetes mellitus without complication ([place])    Dyspnea    WITH EXCERCISE   History of kidney stones    Hodgkin's lymphoma ([place])     chemotherapy. now in remission   Hypertension    Irritable bowel syndrome with constipation    Melanoma ([facility] [facility]) 2001   Right forehead. Metastatic melanoma 2016   Peripheral neuropathy    Sepsis ([place]) 07/2020   developed stones after TKR and was readmit to hospital for this   Sleep apnea    USES CPAP   Vocal cord paralysis    wedge placed on cords so he can speak    Assessment/Plan:   1 Day Post-Op Procedure(s) (LRB): Left knee revision, polyethylene exchange (Left) Principal Problem:   Status post revision of total knee replacement, left  Estimated body mass index is 33.27 kg/m as calculated from the following:   Height as of this encounter: '6\' 1"'$  (1.854 m).   Weight as of this encounter: 114.4 kg. Advance diet Up with therapy Pain controlled VSS Labs pending CM to assist with discharge to home with HHPT pending completion of PT goals  DVT Prophylaxis - Lovenox, TED hose, and SCDs Weight-Bearing as tolerated to left leg   T. [name], PA-C [place] 11/02/2021, 8:12 AM

## 2021-11-02 NOTE — Discharge Summary (Signed)
Physician Discharge Summary  Patient ID: Austin Sullivan MRN: [id number] DOB/AGE: 68-Jun-1957 68 y.o.  Admit date: 11/01/2021 Discharge date: 11/02/2021  Admission Diagnoses:  Status post revision of total knee replacement, left [Z96.652]   Discharge Diagnoses: Patient Active Problem List   Diagnosis Date Noted   Status post revision of total knee replacement, 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   Ileus (HCC)    Sepsis associated hypotension ([place]) 07/26/2020   Essential hypertension 07/26/2020   AKI (acute kidney injury) ([place]) 07/26/2020   Hyperlipidemia 07/26/2020   S/P TKR (total knee replacement) using cement, left 07/20/2020    Past Medical History:  Diagnosis Date   Arthritis    Basal cell carcinoma 01/30/2019   Left ear, superior helix. Nodular and infiltrative patterns. Simple excision/EDC.   Basal cell carcinoma 11/03/2019   left lat brow   Basal cell carcinoma 06/30/2021   left forehead above lat brow, schedule Mohs   BPH (benign prostatic hyperplasia)    Chemotherapy management, encounter for    FOR hodgkins lymphoma. in remission now   COPD (chronic obstructive pulmonary disease) (HCC)    Diabetes mellitus without complication ([place])    Dyspnea    WITH EXCERCISE   History of kidney stones    Hodgkin's lymphoma ([place])    chemotherapy. now in remission   Hypertension    Irritable bowel syndrome with constipation    Melanoma ([place]) 2001   Right forehead. Metastatic melanoma 2016   Peripheral neuropathy    Sepsis ([place]) 07/2020   developed stones after TKR and was readmit to hospital for this   Sleep apnea    USES CPAP   Vocal cord paralysis    wedge placed on cords so he can speak     Transfusion: none   Consultants (if any):   Discharged Condition: Improved  Hospital Course: Austin Sullivan is an 66 y.o. male who was admitted 11/01/2021 with a diagnosis of Status post revision of total knee replacement, left and went to the operating room on 11/01/2021 and underwent the above named  procedures.    Surgeries: Procedure(s): Left knee revision, polyethylene exchange on 11/01/2021 Patient tolerated the surgery well. Taken to PACU where she was stabilized and then transferred to the orthopedic floor.  Started on Lovenox 30 mg q 12 hrs. Foot pumps applied bilaterally at 80 mm. Heels elevated on bed with rolled towels. No evidence of DVT. Negative [place]. Physical therapy started on day #1 for gait training and transfer. OT started day #1 for ADL and assisted devices.  Patient's foley was d/c on day #1. Patient's IV  was d/c on day #1.  On post op day #1 patient was stable and ready for discharge to home with HHPT.   He was given perioperative antibiotics:  Anti-infectives (From admission, onward)    Start     Dose/Rate Route Frequency Ordered Stop   11/01/21 2000  vancomycin (VANCOCIN) IVPB 1000 mg/200 mL premix        1,000 mg 200 mL/hr over 60 Minutes Intravenous  Once 11/01/21 1016 11/01/21 2337   11/01/21 0723  clindamycin (CLEOCIN) 900 MG/50ML IVPB       Note to Pharmacy: Jeanene Erb E: cabinet override      11/01/21 0723 11/01/21 0741   11/01/21 0719  ceFAZolin (ANCEF) 2-4 GM/100ML-% IVPB  Status:  Discontinued       Note to Pharmacy: Jeanene Erb E: cabinet override      11/01/21 0719 11/01/21 0833   11/01/21 0615  ceFAZolin (ANCEF) IVPB 2g/100  mL premix  Status:  Discontinued        2 g 200 mL/hr over 30 Minutes Intravenous On call to O.R. 11/01/21 4193 11/01/21 0724   11/01/21 0609  ceFAZolin (ANCEF) 2-4 GM/100ML-% IVPB  Status:  Discontinued       Note to Pharmacy: Register, Santiago Glad A: cabinet override      11/01/21 0609 11/01/21 0654   11/01/21 0600  ceFAZolin (ANCEF) IVPB 2g/100 mL premix  Status:  Discontinued        2 g 200 mL/hr over 30 Minutes Intravenous On call to O.R. 11/01/21 0122 11/01/21 0123     .  He was given sequential compression devices, early ambulation, and Lovenox TEDs for DVT prophylaxis.  He benefited maximally from the  hospital stay and there were no complications.    Recent vital signs:  Vitals:   11/02/21 0835 11/02/21 1156  BP: (!) 156/65 (!) 136/57  Pulse: 91 92  Resp: 19 18  Temp: 98.7 F (37.1 C) 97.7 F (36.5 C)  SpO2: 96% 98%    Recent laboratory studies:  Lab Results  Component Value Date   HGB 13.5 11/02/2021   HGB 12.8 (L) 11/01/2021   HGB 13.2 10/20/2021   Lab Results  Component Value Date   WBC 10.0 11/02/2021   PLT 222 11/02/2021   Lab Results  Component Value Date   INR 1.2 07/27/2020   Lab Results  Component Value Date   NA 139 11/02/2021   K 4.0 11/02/2021   CL 108 11/02/2021   CO2 24 11/02/2021   BUN 14 11/02/2021   CREATININE 0.72 11/02/2021   GLUCOSE 159 (H) 11/02/2021    Discharge Medications:   Allergies as of 11/02/2021       Reactions   Bee Venom Swelling   Cefazolin Hives, Rash        Medication List     STOP taking these medications    cycloSPORINE 0.05 % ophthalmic emulsion Commonly known as: RESTASIS   lidocaine-prilocaine cream Commonly known as: EMLA   Linzess 145 MCG Caps capsule Generic drug: linaclotide       TAKE these medications    alfuzosin 10 MG 24 hr tablet Commonly known as: UROXATRAL Take 10 mg by mouth daily.   Azelastine HCl 137 MCG/SPRAY Soln Place 1 spray into the nose in the morning and at bedtime.   Breo Ellipta 100-25 MCG/ACT Aepb Generic drug: fluticasone furoate-vilanterol Inhale 1 puff into the lungs daily.   dutasteride 0.5 MG capsule Commonly known as: AVODART Take 0.5 mg by mouth daily.   enoxaparin 40 MG/0.4ML injection Commonly known as: LOVENOX Inject 0.4 mLs (40 mg total) into the skin daily for 14 days.   fexofenadine 180 MG tablet Commonly known as: ALLEGRA Take 180 mg by mouth daily.   fluticasone 50 MCG/ACT nasal spray Commonly known as: FLONASE Place 2 sprays into both nostrils daily.   gabapentin 300 MG capsule Commonly known as: NEURONTIN Take 300 mg by mouth 2 (two)  times daily.   ipratropium 0.06 % nasal spray Commonly known as: ATROVENT Place 2 sprays into both nostrils 2 (two) times daily.   ipratropium-albuterol 0.5-2.5 (3) MG/3ML Soln Commonly known as: DUONEB Inhale 3 mLs into the lungs 2 (two) times daily as needed for shortness of breath.   meloxicam 15 MG tablet Commonly known as: MOBIC Take 15 mg by mouth daily.   metFORMIN 1000 MG tablet Commonly known as: GLUCOPHAGE Take 1,000 mg by mouth 2 (two) times daily.  methocarbamol 500 MG tablet Commonly known as: ROBAXIN Take 1 tablet (500 mg total) by mouth every 6 (six) hours as needed for muscle spasms.   metoprolol tartrate 25 MG tablet Commonly known as: LOPRESSOR Take 25 mg by mouth 2 (two) times daily.   montelukast 10 MG tablet Commonly known as: SINGULAIR Take 10 mg by mouth at bedtime.   ondansetron 4 MG tablet Commonly known as: ZOFRAN Take 1 tablet (4 mg total) by mouth every 6 (six) hours as needed for nausea.   oxyCODONE 5 MG immediate release tablet Commonly known as: Oxy IR/ROXICODONE Take 1-2 tablets (5-10 mg total) by mouth every 4 (four) hours as needed for moderate pain (pain score 4-6).   pregabalin 50 MG capsule Commonly known as: LYRICA Take 50 mg by mouth See admin instructions. 50 mg with each meal, 100 mg at bedtime   ramipril 10 MG capsule Commonly known as: ALTACE Take 2 capsules (20 mg total) by mouth daily. Hold for now, resume when ok with your pcp. What changed: additional instructions   rosuvastatin 40 MG tablet Commonly known as: CRESTOR Take 40 mg by mouth at bedtime.   senna-docusate 8.6-50 MG tablet Commonly known as: Senokot-S Take 1 tablet by mouth at bedtime as needed for mild constipation.   tadalafil 10 MG tablet Commonly known as: Cialis Take 1-2 tablets (10-20 mg total) by mouth daily as needed for erectile dysfunction.   traMADol 50 MG tablet Commonly known as: ULTRAM Take 1 tablet (50 mg total) by mouth every 6 (six)  hours. What changed:  when to take this reasons to take this               Durable Medical Equipment  (From admission, onward)           Start     Ordered   11/01/21 1017  DME Walker rolling  Once       Question Answer Comment  Walker: With 5 Inch Wheels   Patient needs a walker to treat with the following condition Status post revision of total knee replacement, left      11/01/21 1016   11/01/21 1017  DME 3 n 1  Once        11/01/21 1016   11/01/21 1017  DME Bedside commode  Once       Question:  Patient needs a bedside commode to treat with the following condition  Answer:  Status post revision of total knee replacement, left   11/01/21 1016            Diagnostic Studies: DG Knee 1-2 Views Left  Result Date: 11/01/2021 CLINICAL DATA:  Status post left total knee replacement EXAM: LEFT KNEE - 2 VIEW COMPARISON:  None Available. FINDINGS: Interval postsurgical changes from left total knee arthroplasty. Arthroplasty components appear in their expected alignment. No periprosthetic fracture is identified. Expected postoperative changes within the overlying soft tissues. IMPRESSION: Postsurgical changes from left total knee arthroplasty. Electronically Signed   By: Yetta Glassman M.D.   On: 11/01/2021 09:42    Disposition: Discharge disposition: 06-Home-Health Care Svc          Follow-up Information     Duanne Guess, PA-C Follow up in 2 week(s).   Specialties: Orthopedic Surgery, Emergency Medicine Contact information: [place] [address] [phone]                  Signed: Feliberto Gottron 11/02/2021, 2:07 PM

## 2021-11-02 NOTE — Anesthesia Postprocedure Evaluation (Signed)
Anesthesia Post Note  Patient: Austin Sullivan  Procedure(s) Performed: Left knee revision, polyethylene exchange (Left: Knee)  Patient location during evaluation: Nursing Unit Anesthesia Type: Spinal Level of consciousness: oriented and awake and alert Pain management: pain level controlled Vital Signs Assessment: post-procedure vital signs reviewed and stable Respiratory status: spontaneous breathing Cardiovascular status: blood pressure returned to baseline and stable Postop Assessment: no headache, no backache, no apparent nausea or vomiting and patient able to bend at knees Anesthetic complications: no   No notable events documented.   Last Vitals:  Vitals:   11/01/21 2346 11/02/21 0330  BP: (!) 149/70 (!) 145/66  Pulse: 96 93  Resp:  17  Temp:  36.5 C  SpO2: 96% 95%    Last Pain:  Vitals:   11/02/21 0330  TempSrc: Oral  PainSc:                  Precious Haws Maeven Mcdougall

## 2021-11-02 NOTE — Progress Notes (Signed)
Foley catheter has been removed without any complications at approx 4436.

## 2021-11-02 NOTE — Evaluation (Signed)
Occupational Therapy Evaluation Patient Details Name: Austin Sullivan MRN: [id number] DOB: 05/20/1955 Today's Date: 11/02/2021   History of Present Illness Patient is a 66 year old male with Primary osteoarthritis  of left knee and loose body of left knee. s/p L knee revision, polyethylene exchange.  Medical history of COPD, diabetes, IBS, peripheral neuropathy, hodgkin's lymphoma, melanoma, vocal cord spacer   Clinical Impression   Patient presenting with decreased Ind in self care, balance, functional mobility/transfer, endurance, and safety awareness. Patient reports being mod I at baseline and lives at home with wife PTA. Pt educated on polar care and self care tasks to increase Ind in self care tasks. Patient currently functioning at min guard- min A with use of RW for functional mobility and self care tasks. Patient will benefit from acute OT to increase overall independence in the areas of ADLs, functional mobility, and safety awareness in order to safely discharge home with family.     Recommendations for follow up therapy are one component of a multi-disciplinary discharge planning process, led by the attending physician.  Recommendations may be updated based on patient status, additional functional criteria and insurance authorization.   Follow Up Recommendations  No OT follow up    Assistance Recommended at Discharge Intermittent Supervision/Assistance  Patient can return home with the following A little help with walking and/or transfers;A little help with bathing/dressing/bathroom;Assist for transportation;Assistance with cooking/housework    Functional Status Assessment  Patient has had a recent decline in their functional status and demonstrates the ability to make significant improvements in function in a reasonable and predictable amount of time.  Equipment Recommendations  None recommended by OT       Precautions / Restrictions Precautions Precautions: Knee;Fall Precaution  Booklet Issued: Yes (comment) Restrictions Weight Bearing Restrictions: Yes LLE Weight Bearing: Weight bearing as tolerated      Mobility Bed Mobility               General bed mobility comments: seated in recliner chair    Transfers Overall transfer level: Needs assistance Equipment used: Rolling walker (2 wheels) Transfers: Sit to/from Stand Sit to Stand: Min guard                  Balance Overall balance assessment: Needs assistance Sitting-balance support: Feet supported Sitting balance-Leahy Scale: Good     Standing balance support: Bilateral upper extremity supported, Reliant on assistive device for balance Standing balance-Leahy Scale: Fair Standing balance comment: with rolling walker for UE support in standing                           ADL either performed or assessed with clinical judgement   ADL Overall ADL's : Needs assistance/impaired                     Lower Body Dressing: Minimal assistance;Sit to/from stand Lower Body Dressing Details (indicate cue type and reason): simulated Toilet Transfer: Min guard;Rolling walker (2 wheels)                   Vision Patient Visual Report: No change from baseline              Pertinent Vitals/Pain Pain Assessment Pain Assessment: 0-10 Pain Score: 7  Pain Location: L knee Pain Descriptors / Indicators: Sore, Aching Pain Intervention(s): Limited activity within patient's tolerance, Monitored during session, Premedicated before session, Repositioned, Ice applied     Hand Dominance Right  Extremity/Trunk Assessment Upper Extremity Assessment Upper Extremity Assessment: Overall WFL for tasks assessed   Lower Extremity Assessment Lower Extremity Assessment: Defer to PT evaluation       Communication Communication Communication: No difficulties   Cognition Arousal/Alertness: Awake/alert Behavior During Therapy: WFL for tasks assessed/performed Overall Cognitive  Status: Within Functional Limits for tasks assessed                                 General Comments: Pt is A and O x 4                Home Living Family/patient expects to be discharged to:: Private residence Living Arrangements: Spouse/significant other Available Help at Discharge: Family Type of Home: House Home Access: Stairs to enter [employer] of Steps: 1 small step to enter   Home Layout: One level     Bathroom Shower/Tub: Tub/shower unit;Walk-in shower   Bathroom Toilet: Handicapped height     Home Equipment: Conservation officer, nature (2 wheels);Crutches;Cane - single point;BSC/3in1          Prior Functioning/Environment Prior Level of Function : Independent/Modified Independent                        OT Problem List: Decreased strength;Pain;Decreased range of motion;Decreased activity tolerance;Decreased safety awareness;Impaired balance (sitting and/or standing);Decreased knowledge of use of DME or AE;Decreased knowledge of precautions      OT Treatment/Interventions: Self-care/ADL training;Therapeutic exercise;Therapeutic activities;DME and/or AE instruction;Manual therapy;Balance training;Patient/family education;Energy conservation    OT Goals(Current goals can be found in the care plan section) Acute Rehab OT Goals Patient Stated Goal: to go home OT Goal Formulation: With patient Time For Goal Achievement: 11/16/21 Potential to Achieve Goals: Good ADL Goals Pt Will Perform Grooming: with modified independence;standing Pt Will Perform Lower Body Dressing: with modified independence;sit to/from stand Pt Will Transfer to Toilet: with modified independence;ambulating Pt Will Perform Toileting - Clothing Manipulation and hygiene: with modified independence;sit to/from stand  OT Frequency: Min 2X/week       AM-PAC OT "6 Clicks" Daily Activity     Outcome Measure   Help from another person taking care of personal grooming?:  None Help from another person toileting, which includes using toliet, bedpan, or urinal?: A Little Help from another person bathing (including washing, rinsing, drying)?: A Little Help from another person to put on and taking off regular upper body clothing?: None Help from another person to put on and taking off regular lower body clothing?: A Little 6 Click Score: 17   End of Session Equipment Utilized During Treatment: Rolling walker (2 wheels) Nurse Communication: Mobility status  Activity Tolerance: Patient limited by pain Patient left: with call bell/phone within reach;in chair;with chair alarm set  OT Visit Diagnosis: Unsteadiness on feet (R26.81);Muscle weakness (generalized) (M62.81)                Time: 7619-5093 OT Time Calculation (min): 17 min Charges:  OT General Charges $OT Visit: 1 Visit OT Evaluation $OT Eval Low Complexity: 1 Low OT Treatments $Self Care/Home Management : 8-22 mins  Darleen Crocker, MS, OTR/L , CBIS ascom [phone]  11/02/21, 11:49 AM

## 2021-11-02 NOTE — Progress Notes (Signed)
DISCHARGE NOTE:  Pt and wife given discharge instructions and verbalized understanding. Pt connected to [employer] wound vac, extra honeycomb dressing sent wit pt. TED hose on both legs. Pt wheeled to car by staff. Wife providing transportation.

## 2021-11-02 NOTE — Progress Notes (Signed)
Physical Therapy Treatment Patient Details Name: Austin Sullivan MRN: [id number] DOB: 1956/03/24 Today's Date: 11/02/2021   History of Present Illness Patient is a 66 year old male with Primary osteoarthritis  of left knee and loose body of left knee. s/p L knee revision, polyethylene exchange.  Medical history of COPD, diabetes, IBS, peripheral neuropathy, hodgkin's lymphoma, melanoma, vocal cord spacer    PT Comments    Pt was sitting in recliner with supportive spouse present. He is A and O x 4 and agreeable to session." I feel a lot better than I did this morning." Pt was able to stand to RW and ambulate 200 ft with supervision. No LOB or safety concern. Pt ambulated into BR once back to room and was able to pee small amount (first urination since cath removed). Pt is cleared form an acute PT standpoint for safe DC home with HHPT to follow.    Recommendations for follow up therapy are one component of a multi-disciplinary discharge planning process, led by the attending physician.  Recommendations may be updated based on patient status, additional functional criteria and insurance authorization.  Follow Up Recommendations  Home health PT     Assistance Recommended at Discharge Set up Supervision/Assistance  Patient can return home with the following Assist for transportation;Help with stairs or ramp for entrance   Equipment Recommendations  None recommended by PT       Precautions / Restrictions Precautions Precautions: Knee;Fall Precaution Booklet Issued: Yes (comment) Restrictions Weight Bearing Restrictions: Yes LLE Weight Bearing: Weight bearing as tolerated     Mobility  Bed Mobility Overal bed mobility: Needs Assistance Bed Mobility: Supine to Sit  Supine to sit: Supervision, HOB elevated  General bed mobility comments: in recliner pre/post session    Transfers Overall transfer level: Needs assistance Equipment used: Rolling walker (2 wheels) Transfers: Sit to/from  Stand Sit to Stand: Supervision     Ambulation/Gait Ambulation/Gait assistance: Supervision Gait Distance (Feet): 200 Feet Assistive device: Rolling walker (2 wheels) Gait Pattern/deviations: Step-through pattern, Trunk flexed Gait velocity: decreased     General Gait Details: pt ambulated 200 ft with RW without LOB or safety concern     Balance Overall balance assessment: Needs assistance Sitting-balance support: Feet supported Sitting balance-Leahy Scale: Good     Standing balance support: Bilateral upper extremity supported, Reliant on assistive device for balance Standing balance-Leahy Scale: Good Standing balance comment: no LOB or safety concern       Cognition Arousal/Alertness: Awake/alert Behavior During Therapy: WFL for tasks assessed/performed Overall Cognitive Status: Within Functional Limits for tasks assessed      General Comments: Pt is A and O x 4        Exercises Total Joint Exercises Ankle Circles/Pumps: AROM, 10 reps Quad Sets: AROM, 10 reps Heel Slides: AROM, 10 reps Straight Leg Raises: AAROM, 5 reps Goniometric ROM: 6-77    General Comments General comments (skin integrity, edema, etc.): reviewed HH expectation and need to contiue to focus on ROM/strength/and balance      Pertinent Vitals/Pain Pain Assessment Pain Assessment: 0-10 Pain Score: 4  Pain Location: L knee Pain Descriptors / Indicators: Sore, Aching Pain Intervention(s): Limited activity within patient's tolerance, Monitored during session, Premedicated before session, Repositioned, Ice applied    Home Living Family/patient expects to be discharged to:: Private residence Living Arrangements: Spouse/significant other Available Help at Discharge: Family Type of Home: House Home Access: Stairs to enter   [employer] of Steps: 1 small step to enter   Home  Layout: One level Home Equipment: Conservation officer, nature (2 wheels);Crutches;Cane - single point;BSC/3in1           PT Goals (current goals can now be found in the care plan section) Acute Rehab PT Goals Patient Stated Goal: go home Progress towards PT goals: Progressing toward goals    Frequency    BID      PT Plan Current plan remains appropriate       AM-PAC PT "6 Clicks" Mobility   Outcome Measure  Help needed turning from your back to your side while in a flat bed without using bedrails?: None Help needed moving from lying on your back to sitting on the side of a flat bed without using bedrails?: A Little Help needed moving to and from a bed to a chair (including a wheelchair)?: A Little Help needed standing up from a chair using your arms (e.g., wheelchair or bedside chair)?: A Little Help needed to walk in hospital room?: A Little Help needed climbing 3-5 steps with a railing? : A Little 6 Click Score: 19    End of Session   Activity Tolerance: Patient tolerated treatment well Patient left: in chair;with call bell/phone within reach;with chair alarm set Nurse Communication: Mobility status PT Visit Diagnosis: Unsteadiness on feet (R26.81);Other abnormalities of gait and mobility (R26.89)     Time: 7614-7092 PT Time Calculation (min) (ACUTE ONLY): 35 min  Charges:  $Gait Training: 8-22 mins $Therapeutic Exercise: 8-22 mins $Therapeutic Activity: 8-22 mins                     Julaine Fusi PTA 11/02/21, 1:59 PM

## 2021-11-02 NOTE — Discharge Instructions (Signed)

## 2021-11-02 NOTE — Progress Notes (Signed)
Physical Therapy Treatment Patient Details Name: Austin Sullivan MRN: [id number] DOB: [birth year] Today's Date: 11/02/2021   History of Present Illness Patient is a 66 year old male with Primary osteoarthritis  of left knee and loose body of left knee. s/p L knee revision, polyethylene exchange.  Medical history of COPD, diabetes, IBS, peripheral neuropathy, hodgkin's lymphoma, melanoma, vocal cord spacer    PT Comments    Pt is long sitting in bed with bone foam in place upon arriving. He is A and O x 4 but endorses dealing with severe nausea. Had 2 episodes of vomiting earlier this date. He was agreeable to session but limited by nausea. He endorses 4/10 pain at rest that elevated to 6/10 pain with movements/wt bearing. Was able to exit bed without physical assistance, stood to RW and ambulated short distance to recliner. Will progress gait distances this afternoon if/when nausea is improved. Pt was able to tolerate there ex/ROM exercises. Overall pt is doing well from a PT standpoint. Recommend home with HHPT once deemed medically stable. Acute PT will continue to follow and progress as able per current POC.    Recommendations for follow up therapy are one component of a multi-disciplinary discharge planning process, led by the attending physician.  Recommendations may be updated based on patient status, additional functional criteria and insurance authorization.  Follow Up Recommendations  Home health PT     Assistance Recommended at Discharge Set up Supervision/Assistance  Patient can return home with the following Assist for transportation;Help with stairs or ramp for entrance   Equipment Recommendations  None recommended by PT       Precautions / Restrictions Precautions Precautions: Knee;Fall Precaution Booklet Issued: Yes (comment) Restrictions Weight Bearing Restrictions: Yes LLE Weight Bearing: Weight bearing as tolerated     Mobility  Bed Mobility Overal bed mobility: Needs  Assistance Bed Mobility: Supine to Sit  Supine to sit: Supervision, HOB elevated  General bed mobility comments: pt was able to exit bed without physical assistance. pt used UEs to assist LEs to EOB.    Transfers Overall transfer level: Needs assistance Equipment used: Rolling walker (2 wheels) Transfers: Sit to/from Stand Sit to Stand: Supervision   Ambulation/Gait Ambulation/Gait assistance: Min guard Gait Distance (Feet): 8 Feet Assistive device: Rolling walker (2 wheels) Gait Pattern/deviations: Step-to pattern, Decreased stride length, Decreased stance time - left Gait velocity: decreased     General Gait Details: Pt is more limited by nausea than pain or physical limitations. Will progress gait distances this afternoon if pt is able to tolerate.    Balance Overall balance assessment: Needs assistance Sitting-balance support: Feet supported Sitting balance-Leahy Scale: Good     Standing balance support: Bilateral upper extremity supported, Reliant on assistive device for balance Standing balance-Leahy Scale: Fair Standing balance comment: with rolling walker for UE support in standing       Cognition Arousal/Alertness: Awake/alert Behavior During Therapy: WFL for tasks assessed/performed Overall Cognitive Status: Within Functional Limits for tasks assessed        General Comments: Pt is A and O x 4        Exercises Total Joint Exercises Ankle Circles/Pumps: AROM, 10 reps Quad Sets: AROM, 10 reps Heel Slides: AROM, 10 reps Straight Leg Raises: AAROM, 5 reps Goniometric ROM: 6-77        Pertinent Vitals/Pain Pain Assessment Pain Assessment: 0-10 Pain Score: 6  Pain Location: L knee Pain Descriptors / Indicators: Sore Pain Intervention(s): Limited activity within patient's tolerance, Monitored during session, Premedicated  before session, Repositioned, Ice applied     PT Goals (current goals can now be found in the care plan section) Acute Rehab PT  Goals Patient Stated Goal: to return home Progress towards PT goals: Progressing toward goals    Frequency    BID      PT Plan Current plan remains appropriate       AM-PAC PT "6 Clicks" Mobility   Outcome Measure  Help needed turning from your back to your side while in a flat bed without using bedrails?: None Help needed moving from lying on your back to sitting on the side of a flat bed without using bedrails?: A Little Help needed moving to and from a bed to a chair (including a wheelchair)?: A Little Help needed standing up from a chair using your arms (e.g., wheelchair or bedside chair)?: A Little Help needed to walk in hospital room?: A Little Help needed climbing 3-5 steps with a railing? : A Little 6 Click Score: 19    End of Session   Activity Tolerance: Patient tolerated treatment well Patient left: in chair;with call bell/phone within reach;with chair alarm set Nurse Communication: Mobility status PT Visit Diagnosis: Unsteadiness on feet (R26.81);Other abnormalities of gait and mobility (R26.89)     Time: 9833-8250 PT Time Calculation (min) (ACUTE ONLY): 23 min  Charges:  $Gait Training: 8-22 mins $Therapeutic Exercise: 8-22 mins                     Julaine Fusi PTA 11/02/21, 10:38 AM

## 2021-11-02 NOTE — Plan of Care (Signed)

## 2021-11-22 ENCOUNTER — Telehealth: Payer: Self-pay

## 2021-11-22 NOTE — Telephone Encounter (Signed)
History and specimen tracking updated per [facility] progress note.

## 2022-01-05 ENCOUNTER — Ambulatory Visit (INDEPENDENT_AMBULATORY_CARE_PROVIDER_SITE_OTHER): Payer: Medicare Other | Admitting: Dermatology

## 2022-01-05 DIAGNOSIS — C439 Malignant melanoma of skin, unspecified: Secondary | ICD-10-CM

## 2022-01-05 DIAGNOSIS — C44219 Basal cell carcinoma of skin of left ear and external auricular canal: Secondary | ICD-10-CM | POA: Diagnosis not present

## 2022-01-05 DIAGNOSIS — L814 Other melanin hyperpigmentation: Secondary | ICD-10-CM

## 2022-01-05 DIAGNOSIS — L820 Inflamed seborrheic keratosis: Secondary | ICD-10-CM | POA: Diagnosis not present

## 2022-01-05 DIAGNOSIS — L821 Other seborrheic keratosis: Secondary | ICD-10-CM

## 2022-01-05 DIAGNOSIS — Z85828 Personal history of other malignant neoplasm of skin: Secondary | ICD-10-CM | POA: Diagnosis not present

## 2022-01-05 DIAGNOSIS — I781 Nevus, non-neoplastic: Secondary | ICD-10-CM

## 2022-01-05 DIAGNOSIS — Z85820 Personal history of malignant melanoma of skin: Secondary | ICD-10-CM | POA: Diagnosis not present

## 2022-01-05 DIAGNOSIS — D229 Melanocytic nevi, unspecified: Secondary | ICD-10-CM

## 2022-01-05 DIAGNOSIS — L578 Other skin changes due to chronic exposure to nonionizing radiation: Secondary | ICD-10-CM

## 2022-01-05 DIAGNOSIS — Z1283 Encounter for screening for malignant neoplasm of skin: Secondary | ICD-10-CM

## 2022-01-05 DIAGNOSIS — D489 Neoplasm of uncertain behavior, unspecified: Secondary | ICD-10-CM

## 2022-01-05 NOTE — Progress Notes (Signed)
Follow-Up Visit   Subjective  Austin Sullivan is a 66 y.o. male who presents for the following: Annual Exam (6 mth tbse hx of aks, hx of melanoma, hx of bcc reports spot behind left ear he would like checked and scaly spot at right upper arm. ). The patient presents for Total-Body Skin Exam (TBSE) for skin cancer screening and mole check.  The patient has spots, moles and lesions to be evaluated, some may be new or changing and the patient has concerns that these could be cancer.  The following portions of the chart were reviewed this encounter and updated as appropriate:  Tobacco  Allergies  Meds  Problems  Med Hx  Surg Hx  Fam Hx     Review of Systems: No other skin or systemic complaints except as noted in HPI or Assessment and Plan.  Objective  Well appearing patient in no apparent distress; mood and affect are within normal limits.  A full examination was performed including scalp, head, eyes, ears, nose, lips, neck, chest, axillae, abdomen, back, buttocks, bilateral upper extremities, bilateral lower extremities, hands, feet, fingers, toes, fingernails, and toenails. All findings within normal limits unless otherwise noted below.  left superior helix 1.2 cm crusted papule        right tricep x 2 (2) Erythematous stuck-on, waxy papule or plaque   Assessment & Plan  Neoplasm of uncertain behavior left ear, superior helix  Destruction of lesion Complexity: extensive   Destruction method: electrodesiccation and curettage   Informed consent: discussed and consent obtained   Timeout:  patient name, date of birth, surgical site, and procedure verified Procedure prep:  Patient was prepped and draped in usual sterile fashion Prep type:  Isopropyl alcohol Anesthesia: the lesion was anesthetized in a standard fashion   Anesthetic:  1% lidocaine w/ epinephrine 1-100,000 buffered w/ 8.4% NaHCO3 Curettage performed in three different directions: Yes   Electrodesiccation  performed over the curetted area: Yes   Lesion length (cm):  1.2 Lesion width (cm):  1.2 Margin per side (cm):  0.2 Final wound size (cm):  1.6 Hemostasis achieved with:  pressure, aluminum chloride and electrodesiccation Outcome: patient tolerated procedure well with no complications   Post-procedure details: sterile dressing applied and wound care instructions given   Dressing type: bandage and petrolatum    Skin excision  Lesion length (cm):  1.2 Lesion width (cm):  1.2 Margin per side (cm):  0.2 Total excision diameter (cm):  1.6 Informed consent: discussed and consent obtained   Timeout: patient name, date of birth, surgical site, and procedure verified   Procedure prep:  Patient was prepped and draped in usual sterile fashion Prep type:  Isopropyl alcohol and povidone-iodine Anesthesia: the lesion was anesthetized in a standard fashion   Anesthetic:  1% lidocaine w/ epinephrine 1-100,000 buffered w/ 8.4% NaHCO3 Instrument used: #15 blade   Hemostasis achieved with: pressure   Hemostasis achieved with comment:  Electrocautery Outcome: patient tolerated procedure well with no complications   Post-procedure details: sterile dressing applied and wound care instructions given   Dressing type: bandage and pressure dressing (Mupirocin)    Specimen 1 - Surgical pathology Differential Diagnosis: r/o BCC Check Margins: No R/o bcc   Inflamed seborrheic keratosis (2) right tricep x 2 Symptomatic, irritating, patient would like treated. Destruction of lesion - right tricep x 2 Complexity: simple   Destruction method: cryotherapy   Informed consent: discussed and consent obtained   Timeout:  patient name, date of birth, surgical site, and  procedure verified Lesion destroyed using liquid nitrogen: Yes   Region frozen until ice ball extended beyond lesion: Yes   Outcome: patient tolerated procedure well with no complications   Post-procedure details: wound care instructions given    Additional details:  Prior to procedure, discussed risks of blister formation, small wound, skin dyspigmentation, or rare scar following cryotherapy. Recommend Vaseline ointment to treated areas while healing.  Lentigines - Scattered tan macules - Due to sun exposure - Benign-appearing, observe - Recommend daily broad spectrum sunscreen SPF 30+ to sun-exposed areas, reapply every 2 hours as needed. - Call for any changes  Seborrheic Keratoses - Stuck-on, waxy, tan-brown papules and/or plaques  - Benign-appearing - Discussed benign etiology and prognosis. - Observe - Call for any changes  Melanocytic Nevi - Tan-brown and/or pink-flesh-colored symmetric macules and papules - Benign appearing on exam today - Observation - Call clinic for new or changing moles - Recommend daily use of broad spectrum spf 30+ sunscreen to sun-exposed areas.   Hemangiomas - Red papules - Discussed benign nature - Observe - Call for any changes  Telangiectasia Nose - Dilated blood vessel - Benign appearing on exam - Call for changes  Actinic Damage - Chronic condition, secondary to cumulative UV/sun exposure - diffuse scaly erythematous macules with underlying dyspigmentation - Recommend daily broad spectrum sunscreen SPF 30+ to sun-exposed areas, reapply every 2 hours as needed.  - Staying in the shade or wearing long sleeves, sun glasses (UVA+UVB protection) and wide brim hats (4-inch brim around the entire circumference of the hat) are also recommended for sun protection.  - Call for new or changing lesions.  History of Melanoma -metastatic -status post surgery; chemotherapy; and Keytruda treatments at right forehead  Followed by Dr[facility] Harriet Masson at [facility] and here  - No evidence of recurrence today - No lymphadenopathy - Recommend regular full body skin exams - Recommend daily broad spectrum sunscreen SPF 30+ to sun-exposed areas, reapply every 2 hours as needed.  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  - Call if any new or changing lesions are noted between office visits   Skin cancer screening performed today. Return in about 6 months (around 07/07/2022) for TBSE. IRuthell Rummage, CMA, am acting as scribe for [name], MD. Documentation: I have reviewed the above documentation for accuracy and completeness, and I agree with the above.  [name], MD

## 2022-01-05 NOTE — Patient Instructions (Addendum)
Electrodesiccation and Curettage ("Scrape and Burn") Wound Care Instructions  Leave the original bandage on for 24 hours if possible.  If the bandage becomes soaked or soiled before that time, it is OK to remove it and examine the wound.  A small amount of post-operative bleeding is normal.  If excessive bleeding occurs, remove the bandage, place gauze over the site and apply continuous pressure (no peeking) over the area for 30 minutes. If this does not work, please call our clinic as soon as possible or page your doctor if it is after hours.   Once a day, cleanse the wound with soap and water. It is fine to shower. If a thick crust develops you may use a Q-tip dipped into dilute hydrogen peroxide (mix 1:1 with water) to dissolve it.  Hydrogen peroxide can slow the healing process, so use it only as needed.    After washing, apply petroleum jelly (Vaseline) or an antibiotic ointment if your doctor prescribed one for you, followed by a bandage.    For best healing, the wound should be covered with a layer of ointment at all times. If you are not able to keep the area covered with a bandage to hold the ointment in place, this may mean re-applying the ointment several times a day.  Continue this wound care until the wound has healed and is no longer open. It may take several weeks for the wound to heal and close.  Itching and mild discomfort is normal during the healing process.  If you have any discomfort, you can take Tylenol (acetaminophen) or ibuprofen as directed on the bottle. (Please do not take these if you have an allergy to them or cannot take them for another reason).  Some redness, tenderness and white or yellow material in the wound is normal healing.  If the area becomes very sore and red, or develops a thick yellow-green material (pus), it may be infected; please notify us.    Wound healing continues for up to one year following surgery. It is not unusual to experience pain in the scar  from time to time during the interval.  If the pain becomes severe or the scar thickens, you should notify the office.    A slight amount of redness in a scar is expected for the first six months.  After six months, the redness will fade and the scar will soften and fade.  The color difference becomes less noticeable with time.  If there are any problems, return for a post-op surgery check at your earliest convenience.  To improve the appearance of the scar, you can use silicone scar gel, cream, or sheets (such as Mederma or Serica) every night for up to one year. These are available over the counter (without a prescription).  Please call our office at [phone] for any questions or concerns.  Biopsy Wound Care Instructions  Leave the original bandage on for 24 hours if possible.  If the bandage becomes soaked or soiled before that time, it is OK to remove it and examine the wound.  A small amount of post-operative bleeding is normal.  If excessive bleeding occurs, remove the bandage, place gauze over the site and apply continuous pressure (no peeking) over the area for 30 minutes. If this does not work, please call our clinic as soon as possible or page your doctor if it is after hours.   Once a day, cleanse the wound with soap and water. It is fine to shower. If   a thick crust develops you may use a Q-tip dipped into dilute hydrogen peroxide (mix 1:1 with water) to dissolve it.  Hydrogen peroxide can slow the healing process, so use it only as needed.    After washing, apply petroleum jelly (Vaseline) or an antibiotic ointment if your doctor prescribed one for you, followed by a bandage.    For best healing, the wound should be covered with a layer of ointment at all times. If you are not able to keep the area covered with a bandage to hold the ointment in place, this may mean re-applying the ointment several times a day.  Continue this wound care until the wound has healed and is no longer  open.   Itching and mild discomfort is normal during the healing process. However, if you develop pain or severe itching, please call our office.   If you have any discomfort, you can take Tylenol (acetaminophen) or ibuprofen as directed on the bottle. (Please do not take these if you have an allergy to them or cannot take them for another reason).  Some redness, tenderness and white or yellow material in the wound is normal healing.  If the area becomes very sore and red, or develops a thick yellow-green material (pus), it may be infected; please notify us.    If you have stitches, return to clinic as directed to have the stitches removed. You will continue wound care for 2-3 days after the stitches are removed.   Wound healing continues for up to one year following surgery. It is not unusual to experience pain in the scar from time to time during the interval.  If the pain becomes severe or the scar thickens, you should notify the office.    A slight amount of redness in a scar is expected for the first six months.  After six months, the redness will fade and the scar will soften and fade.  The color difference becomes less noticeable with time.  If there are any problems, return for a post-op surgery check at your earliest convenience.  To improve the appearance of the scar, you can use silicone scar gel, cream, or sheets (such as Mederma or Serica) every night for up to one year. These are available over the counter (without a prescription).  Please call our office at [phone] for any questions or concerns.   Cryotherapy Aftercare  Wash gently with soap and water everyday.   Apply Vaseline and Band-Aid daily until healed.    Seborrheic Keratosis  What causes seborrheic keratoses? Seborrheic keratoses are harmless, common skin growths that first appear during adult life.  As time goes by, more growths appear.  Some people may develop a large number of them.  Seborrheic keratoses  appear on both covered and uncovered body parts.  They are not caused by sunlight.  The tendency to develop seborrheic keratoses can be inherited.  They vary in color from skin-colored to gray, brown, or even black.  They can be either smooth or have a rough, warty surface.   Seborrheic keratoses are superficial and look as if they were stuck on the skin.  Under the microscope this type of keratosis looks like layers upon layers of skin.  That is why at times the top layer may seem to fall off, but the rest of the growth remains and re-grows.    Treatment Seborrheic keratoses do not need to be treated, but can easily be removed in the office.  Seborrheic keratoses often cause   symptoms when they rub on clothing or jewelry.  Lesions can be in the way of shaving.  If they become inflamed, they can cause itching, soreness, or burning.  Removal of a seborrheic keratosis can be accomplished by freezing, burning, or surgery. If any spot bleeds, scabs, or grows rapidly, please return to have it checked, as these can be an indication of a skin cancer.      Melanoma ABCDEs  Melanoma is the most dangerous type of skin cancer, and is the leading cause of death from skin disease.  You are more likely to develop melanoma if you: Have light-colored skin, light-colored eyes, or red or blond hair Spend a lot of time in the sun Tan regularly, either outdoors or in a tanning bed Have had blistering sunburns, especially during childhood Have a close family member who has had a melanoma Have atypical moles or large birthmarks  Early detection of melanoma is key since treatment is typically straightforward and cure rates are extremely high if we catch it early.   The first sign of melanoma is often a change in a mole or a new dark spot.  The ABCDE system is a way of remembering the signs of melanoma.  A for asymmetry:  The two halves do not match. B for border:  The edges of the growth are irregular. C for  color:  A mixture of colors are present instead of an even brown color. D for diameter:  Melanomas are usually (but not always) greater than 6mm - the size of a pencil eraser. E for evolution:  The spot keeps changing in size, shape, and color.  Please check your skin once per month between visits. You can use a small mirror in front and a large mirror behind you to keep an eye on the back side or your body.   If you see any new or changing lesions before your next follow-up, please call to schedule a visit.  Please continue daily skin protection including broad spectrum sunscreen SPF 30+ to sun-exposed areas, reapplying every 2 hours as needed when you're outdoors.   Staying in the shade or wearing long sleeves, sun glasses (UVA+UVB protection) and wide brim hats (4-inch brim around the entire circumference of the hat) are also recommended for sun protection.    Due to recent changes in healthcare laws, you may see results of your pathology and/or laboratory studies on MyChart before the doctors have had a chance to review them. We understand that in some cases there may be results that are confusing or concerning to you. Please understand that not all results are received at the same time and often the doctors may need to interpret multiple results in order to provide you with the best plan of care or course of treatment. Therefore, we ask that you please give us 2 business days to thoroughly review all your results before contacting the office for clarification. Should we see a critical lab result, you will be contacted sooner.   If You Need Anything After Your Visit  If you have any questions or concerns for your doctor, please call our main line at [phone] and press option 4 to reach your doctor's medical assistant. If no one answers, please leave a voicemail as directed and we will return your call as soon as possible. Messages left after 4 pm will be answered the following business day.    You may also send us a message via MyChart. We typically respond to MyChart   messages within 1-2 business days.  For prescription refills, please ask your pharmacy to contact our office. Our fax number is [phone].  If you have an urgent issue when the clinic is closed that cannot wait until the next business day, you can page your doctor at the number below.    Please note that while we do our best to be available for urgent issues outside of office hours, we are not available 24/7.   If you have an urgent issue and are unable to reach us, you may choose to seek medical care at your doctor's office, retail clinic, urgent care center, or emergency room.  If you have a medical emergency, please immediately call 911 or go to the emergency department.  Pager Numbers  - Dr. Kowalski: [phone]  - Dr. Moye: [phone]  - Dr. Stewart: [phone]  In the event of inclement weather, please call our main line at [phone] for an update on the status of any delays or closures.  Dermatology Medication Tips: Please keep the boxes that topical medications come in in order to help keep track of the instructions about where and how to use these. Pharmacies typically print the medication instructions only on the boxes and not directly on the medication tubes.   If your medication is too expensive, please contact our office at [phone] option 4 or send us a message through MyChart.   We are unable to tell what your co-pay for medications will be in advance as this is different depending on your insurance coverage. However, we may be able to find a substitute medication at lower cost or fill out paperwork to get insurance to cover a needed medication.   If a prior authorization is required to get your medication covered by your insurance company, please allow us 1-2 business days to complete this process.  Drug prices often vary depending on where the prescription is filled and some  pharmacies may offer cheaper prices.  The website www.goodrx.com contains coupons for medications through different pharmacies. The prices here do not account for what the cost may be with help from insurance (it may be cheaper with your insurance), but the website can give you the price if you did not use any insurance.  - You can print the associated coupon and take it with your prescription to the pharmacy.  - You may also stop by our office during regular business hours and pick up a GoodRx coupon card.  - If you need your prescription sent electronically to a different pharmacy, notify our office through [facility] MyChart or by phone at [phone] option 4.     Si Usted Necesita Algo Despus de Su Visita  Tambin puede enviarnos un mensaje a travs de MyChart. Por lo general respondemos a los mensajes de MyChart en el transcurso de 1 a 2 das hbiles.  Para renovar recetas, por favor pida a su farmacia que se ponga en contacto con nuestra oficina. Nuestro nmero de fax es el [phone].  Si tiene un asunto urgente cuando la clnica est cerrada y que no puede esperar hasta el siguiente da hbil, puede llamar/localizar a su doctor(a) al nmero que aparece a continuacin.   Por favor, tenga en cuenta que aunque hacemos todo lo posible para estar disponibles para asuntos urgentes fuera del horario de oficina, no estamos disponibles las 24 horas del da, los 7 das de la semana.   Si tiene un problema urgente y no puede comunicarse con nosotros, puede optar por   buscar atencin mdica  en el consultorio de su doctor(a), en una clnica privada, en un centro de atencin urgente o en una sala de emergencias.  Si tiene una emergencia mdica, por favor llame inmediatamente al 911 o vaya a la sala de emergencias.  Nmeros de bper  - Dr. Kowalski: [phone]  - Dra. Moye: [phone]  - Dra. Stewart: [phone]  En caso de inclemencias del tiempo, por favor llame a nuestra lnea  principal al [phone] para una actualizacin sobre el estado de cualquier retraso o cierre.  Consejos para la medicacin en dermatologa: Por favor, guarde las cajas en las que vienen los medicamentos de uso tpico para ayudarle a seguir las instrucciones sobre dnde y cmo usarlos. Las farmacias generalmente imprimen las instrucciones del medicamento slo en las cajas y no directamente en los tubos del medicamento.   Si su medicamento es muy caro, por favor, pngase en contacto con nuestra oficina llamando al [phone] y presione la opcin 4 o envenos un mensaje a travs de MyChart.   No podemos decirle cul ser su copago por los medicamentos por adelantado ya que esto es diferente dependiendo de la cobertura de su seguro. Sin embargo, es posible que podamos encontrar un medicamento sustituto a menor costo o llenar un formulario para que el seguro cubra el medicamento que se considera necesario.   Si se requiere una autorizacin previa para que su compaa de seguros cubra su medicamento, por favor permtanos de 1 a 2 das hbiles para completar este proceso.  Los precios de los medicamentos varan con frecuencia dependiendo del lugar de dnde se surte la receta y alguna farmacias pueden ofrecer precios ms baratos.  El sitio web www.goodrx.com tiene cupones para medicamentos de diferentes farmacias. Los precios aqu no tienen en cuenta lo que podra costar con la ayuda del seguro (puede ser ms barato con su seguro), pero el sitio web puede darle el precio si no utiliz ningn seguro.  - Puede imprimir el cupn correspondiente y llevarlo con su receta a la farmacia.  - Tambin puede pasar por nuestra oficina durante el horario de atencin regular y recoger una tarjeta de cupones de GoodRx.  - Si necesita que su receta se enve electrnicamente a una farmacia diferente, informe a nuestra oficina a travs de MyChart de [facility] o por telfono llamando al [phone] y presione la opcin  4.  

## 2022-01-09 ENCOUNTER — Telehealth: Payer: Self-pay

## 2022-01-09 NOTE — Telephone Encounter (Signed)
-----   Message from Ralene Bathe, MD sent at 01/06/2022  7:30 PM EDT ----- Diagnosis Skin , left superior helix BASAL CELL CARCINOMA, MORPHEAFORM TYPE  Cancer - BCC Already treated Recheck next visit

## 2022-01-09 NOTE — Telephone Encounter (Signed)
Advised pt of bx result/sh ?

## 2022-01-10 ENCOUNTER — Encounter: Payer: Self-pay | Admitting: Dermatology

## 2022-03-08 ENCOUNTER — Other Ambulatory Visit: Payer: Medicare Other

## 2022-03-08 DIAGNOSIS — N400 Enlarged prostate without lower urinary tract symptoms: Secondary | ICD-10-CM

## 2022-03-08 DIAGNOSIS — Z125 Encounter for screening for malignant neoplasm of prostate: Secondary | ICD-10-CM

## 2022-03-08 DIAGNOSIS — N201 Calculus of ureter: Secondary | ICD-10-CM

## 2022-03-09 ENCOUNTER — Other Ambulatory Visit: Payer: Medicare Other

## 2022-03-09 LAB — PSA: Prostate Specific Ag, Serum: 0.3 ng/mL (ref 0.0–4.0)

## 2022-03-15 ENCOUNTER — Ambulatory Visit: Payer: Medicare Other | Admitting: Urology

## 2022-03-15 ENCOUNTER — Ambulatory Visit
Admission: RE | Admit: 2022-03-15 | Discharge: 2022-03-15 | Disposition: A | Discharge status: Home / Self Care | Payer: Medicare Other | Source: Ambulatory Visit | Attending: Urology | Admitting: Urology

## 2022-03-15 ENCOUNTER — Ambulatory Visit
Admission: RE | Admit: 2022-03-15 | Discharge: 2022-03-15 | Disposition: A | Discharge status: Home / Self Care | Payer: Medicare Other | Attending: Urology | Admitting: Urology

## 2022-03-15 DIAGNOSIS — N201 Calculus of ureter: Secondary | ICD-10-CM | POA: Diagnosis not present

## 2022-03-16 ENCOUNTER — Encounter: Payer: Self-pay | Admitting: Urology

## 2022-03-16 ENCOUNTER — Ambulatory Visit (INDEPENDENT_AMBULATORY_CARE_PROVIDER_SITE_OTHER): Payer: Medicare Other | Admitting: Urology

## 2022-03-16 VITALS — BP 147/71 | HR 86 | Ht 73.0 in | Wt 255.0 lb

## 2022-03-16 DIAGNOSIS — Z87442 Personal history of urinary calculi: Secondary | ICD-10-CM

## 2022-03-16 DIAGNOSIS — N529 Male erectile dysfunction, unspecified: Secondary | ICD-10-CM

## 2022-03-16 DIAGNOSIS — Z125 Encounter for screening for malignant neoplasm of prostate: Secondary | ICD-10-CM | POA: Diagnosis not present

## 2022-03-16 DIAGNOSIS — N200 Calculus of kidney: Secondary | ICD-10-CM

## 2022-03-16 DIAGNOSIS — R93422 Abnormal radiologic findings on diagnostic imaging of left kidney: Secondary | ICD-10-CM

## 2022-03-16 DIAGNOSIS — N138 Other obstructive and reflux uropathy: Secondary | ICD-10-CM

## 2022-03-16 DIAGNOSIS — N401 Enlarged prostate with lower urinary tract symptoms: Secondary | ICD-10-CM

## 2022-03-16 MED ORDER — SILDENAFIL CITRATE 100 MG PO TABS: 100.0000 mg | ORAL_TABLET | Freq: Every day | ORAL | 6 refills | Status: DC | PRN

## 2022-03-16 NOTE — Patient Instructions (Signed)
Erectile Dysfunction ?Erectile dysfunction (ED) is the inability to get or keep an erection in order to have sexual intercourse. ED is considered a symptom of an underlying disorder and is not considered a disease. ED may include: ?Inability to get an erection. ?Lack of enough hardness of the erection to allow penetration. ?Loss of erection before sex is finished. ?What are the causes? ?This condition may be caused by: ?Physical causes, such as: ?Artery problems. This may include heart disease, high blood pressure, atherosclerosis, and diabetes. ?Hormonal problems, such as low testosterone. ?Obesity. ?Nerve problems. This may include back or pelvic injuries, multiple sclerosis, Parkinson's disease, spinal cord injury, and stroke. ?Certain medicines, such as: ?Pain relievers. ?Antidepressants. ?Blood pressure medicines and water pills (diuretics). ?Cancer medicines. ?Antihistamines. ?Muscle relaxants. ?Lifestyle factors, such as: ?Use of drugs such as marijuana, cocaine, or opioids. ?Excessive use of alcohol. ?Smoking. ?Lack of physical activity or exercise. ?Psychological causes, such as: ?Anxiety or stress. ?Sadness or depression. ?Exhaustion. ?Fear about sexual performance. ?Guilt. ?What are the signs or symptoms? ?Symptoms of this condition include: ?Inability to get an erection. ?Lack of enough hardness of the erection to allow penetration. ?Loss of the erection before sex is finished. ?Sometimes having normal erections, but with frequent unsatisfactory episodes. ?Low sexual satisfaction in either partner due to erection problems. ?A curved penis occurring with erection. The curve may cause pain, or the penis may be too curved to allow for intercourse. ?Never having nighttime or morning erections. ?How is this diagnosed? ?This condition is often diagnosed by: ?Performing a physical exam to find other diseases or specific problems with the penis. ?Asking you detailed questions about the problem. ?Doing tests,  such as: ?Blood tests to check for diabetes mellitus or high cholesterol, or to measure hormone levels. ?Other tests to check for underlying health conditions. ?An ultrasound exam to check for scarring. ?A test to check blood flow to the penis. ?Doing a sleep study at home to measure nighttime erections. ?How is this treated? ?This condition may be treated by: ?Medicines, such as: ?Medicine taken by mouth to help you achieve an erection (oral medicine). ?Hormone replacement therapy to replace low testosterone levels. ?Medicine that is injected into the penis. Your health care provider may instruct you how to give yourself these injections at home. ?Medicine that is delivered with a short applicator tube. The tube is inserted into the opening at the tip of the penis, which is the opening of the urethra. A tiny pellet of medicine is put in the urethra. The pellet dissolves and enhances erectile function. This is also called MUSE (medicated urethral system for erections) therapy. ?Vacuum pump. This is a pump with a ring on it. The pump and ring are placed on the penis and used to create pressure that helps the penis become erect. ?Penile implant surgery. In this procedure, you may receive: ?An inflatable implant. This consists of cylinders, a pump, and a reservoir. The cylinders can be inflated with a fluid that helps to create an erection, and they can be deflated after intercourse. ?A semi-rigid implant. This consists of two silicone rubber rods. The rods provide some rigidity. They are also flexible, so the penis can both curve downward in its normal position and become straight for sexual intercourse. ?Blood vessel surgery to improve blood flow to the penis. During this procedure, a blood vessel from a different part of the body is placed into the penis to allow blood to flow around (bypass) damaged or blocked blood vessels. ?Lifestyle changes,   such as exercising more, losing weight, and quitting smoking. ?Follow  these instructions at home: ?Medicines ? ?Take over-the-counter and prescription medicines only as told by your health care provider. Do not increase the dosage without first discussing it with your health care provider. ?If you are using self-injections, do injections as directed by your health care provider. Make sure you avoid any veins that are on the surface of the penis. After giving an injection, apply pressure to the injection site for 5 minutes. ?Talk to your health care provider about how to prevent headaches while taking ED medicines. These medicines may cause a sudden headache due to the increase in blood flow in your body. ?General instructions ?Exercise regularly, as directed by your health care provider. Work with your health care provider to lose weight, if needed. ?Do not use any products that contain nicotine or tobacco. These products include cigarettes, chewing tobacco, and vaping devices, such as e-cigarettes. If you need help quitting, ask your health care provider. ?Before using a vacuum pump, read the instructions that come with the pump and discuss any questions with your health care provider. ?Keep all follow-up visits. This is important. ?Contact a health care provider if: ?You feel nauseous. ?You are vomiting. ?You get sudden headaches while taking ED medicines. ?You have any concerns about your sexual health. ?Get help right away if: ?You are taking oral or injectable medicines and you have an erection that lasts longer than 4 hours. If your health care provider is unavailable, go to the nearest emergency room for evaluation. An erection that lasts much longer than 4 hours can result in permanent damage to your penis. ?You have severe pain in your groin or abdomen. ?You develop redness or severe swelling of your penis. ?You have redness spreading at your groin or lower abdomen. ?You are unable to urinate. ?You experience chest pain or a rapid heartbeat (palpitations) after taking oral  medicines. ?These symptoms may represent a serious problem that is an emergency. Do not wait to see if the symptoms will go away. Get medical help right away. Call your local emergency services (911 in the [place] Do not drive yourself to the hospital. ?Summary ?Erectile dysfunction (ED) is the inability to get or keep an erection during sexual intercourse. ?This condition is diagnosed based on a physical exam, your symptoms, and tests to determine the cause. Treatment varies depending on the cause and may include medicines, hormone therapy, surgery, or a vacuum pump. ?You may need follow-up visits to make sure that you are using your medicines or devices correctly. ?Get help right away if you are taking or injecting medicines and you have an erection that lasts longer than 4 hours. ?This information is not intended to replace advice given to you by your health care provider. Make sure you discuss any questions you have with your health care provider. ?Document Revised: 06/16/2020 Document Reviewed: 06/16/2020 ?Elsevier Patient Education ? 2023 [employer] ? ?

## 2022-03-16 NOTE — Progress Notes (Signed)
   03/16/2022 9:00 AM   Austin Sullivan 02/12/1956 [id number]  Reason for visit: Follow up nephrolithiasis, BPH, PSA screening, ED  HPI: 65 year old male who I originally met in April 2022 when he presented with sepsis from urinary source and underwent bilateral ureteral stent placement for a left proximal ureteral stone and right UPJ stone.  He ultimately underwent definitive management with bilateral ureteroscopy in May 2022, and stents were removed 1 week later.  He has BPH that has been managed by his PCP long-term with maximal medical therapy on alfuzosin and dutasteride.  Urinary symptoms are stable, and prostate was short and open appearing on recent cystoscopy.  He denies any significant urinary complaints today.  Recent PSA on 03/08/2022 was 0.3, corrected for dutasteride 0.6, and stable from prior.  He denies any stone episodes, gross hematuria, or flank pain.  I personally viewed and interpreted the recent CT abdomen pelvis from the [facility] system on 03/09/2022 performed for history of melanoma which shows no hydronephrosis, and a possible 2 mm left midpole stone.  KUB today with no significant stone burden.  At our last visit we trialed Cialis 10 to 20 mg on demand for ED, and he reports this is only helped a very minimal amount and is interested in other options.  I recommended trying sildenafil 100 mg on demand and risks and benefits were discussed.  Can also check a morning testosterone if no improvement on the sildenafil.  I had a frank conversation that he has a number of comorbidities and medications that contribute to ED.  -Stop Cialis, trial of sildenafil 100 mg on demand -Continue alfuzosin and dutasteride for BPH, filled by PCP -RTC 1 year PSA prior, PVR, will have CT prior from [facility] system for history of melanoma -If persistent ED despite sildenafil, okay to schedule with PA to discuss alternatives and check [place], MD  [facility] [address] [place], [place] [address] [phone]

## 2022-07-19 ENCOUNTER — Ambulatory Visit (INDEPENDENT_AMBULATORY_CARE_PROVIDER_SITE_OTHER): Payer: Medicare Other | Admitting: Dermatology

## 2022-07-19 VITALS — BP 148/74 | HR 91

## 2022-07-19 DIAGNOSIS — Z1283 Encounter for screening for malignant neoplasm of skin: Secondary | ICD-10-CM | POA: Diagnosis not present

## 2022-07-19 DIAGNOSIS — Z85828 Personal history of other malignant neoplasm of skin: Secondary | ICD-10-CM

## 2022-07-19 DIAGNOSIS — L578 Other skin changes due to chronic exposure to nonionizing radiation: Secondary | ICD-10-CM | POA: Diagnosis not present

## 2022-07-19 DIAGNOSIS — L821 Other seborrheic keratosis: Secondary | ICD-10-CM

## 2022-07-19 DIAGNOSIS — L820 Inflamed seborrheic keratosis: Secondary | ICD-10-CM | POA: Diagnosis not present

## 2022-07-19 DIAGNOSIS — L814 Other melanin hyperpigmentation: Secondary | ICD-10-CM

## 2022-07-19 DIAGNOSIS — Z85820 Personal history of malignant melanoma of skin: Secondary | ICD-10-CM | POA: Diagnosis not present

## 2022-07-19 DIAGNOSIS — D229 Melanocytic nevi, unspecified: Secondary | ICD-10-CM

## 2022-07-19 NOTE — Progress Notes (Signed)
Follow-Up Visit   Subjective  Austin Sullivan is a 67 y.o. male who presents for the following: Skin Cancer Screening and Full Body Skin Exam, 6 months f/u hx of Melanoma  The patient presents for Total-Body Skin Exam (TBSE) for skin cancer screening and mole check. The patient has spots, moles and lesions to be evaluated, some may be new or changing and the patient has concerns that these could be cancer.  The following portions of the chart were reviewed this encounter and updated as appropriate: medications, allergies, medical history  Review of Systems:  No other skin or systemic complaints except as noted in HPI or Assessment and Plan.  Objective  Well appearing patient in no apparent distress; mood and affect are within normal limits.  A full examination was performed including scalp, head, eyes, ears, nose, lips, neck, chest, axillae, abdomen, back, buttocks, bilateral upper extremities, bilateral lower extremities, hands, feet, fingers, toes, fingernails, and toenails. All findings within normal limits unless otherwise noted below.   Relevant physical exam findings are noted in the Assessment and Plan.   Assessment & Plan   LENTIGINES, SEBORRHEIC KERATOSES, HEMANGIOMAS - Benign normal skin lesions - Benign-appearing - Call for any changes  MELANOCYTIC NEVI - Tan-brown and/or pink-flesh-colored symmetric macules and papules - Benign appearing on exam today - Observation - Call clinic for new or changing moles - Recommend daily use of broad spectrum spf 30+ sunscreen to sun-exposed areas.   ACTINIC DAMAGE - Chronic condition, secondary to cumulative UV/sun exposure - diffuse scaly erythematous macules with underlying dyspigmentation - Recommend daily broad spectrum sunscreen SPF 30+ to sun-exposed areas, reapply every 2 hours as needed.  - Staying in the shade or wearing long sleeves, sun glasses (UVA+UVB protection) and wide brim hats (4-inch brim around the entire  circumference of the hat) are also recommended for sun protection.  - Call for new or changing lesions.  INFLAMED SEBORRHEIC KERATOSIS Exam: Erythematous keratotic or waxy stuck-on papule or plaque.  Symptomatic, irritating, patient would like treated.  Benign-appearing.  Call clinic for new or changing lesions.   Prior to procedure, discussed risks of blister formation, small wound, skin dyspigmentation, or rare scar following treatment. Recommend Vaseline ointment to treated areas while healing.  Destruction Procedure Note Destruction method: cryotherapy   Informed consent: discussed and consent obtained   Lesion destroyed using liquid nitrogen: Yes   Outcome: patient tolerated procedure well with no complications   Post-procedure details: wound care instructions given   Locations: right forehead # of Lesions Treated: 1   History of Melanoma -metastatic -status post surgery; chemotherapy; Keytruda  In remission  Follow[facility] by [facility]. Dan Europe at [facility] and here  Reviewed outside medical records today. - No evidence of recurrence today - No lymphadenopathy - Recommend regular full body skin exams - Recommend daily broad spectrum sunscreen SPF 30+ to sun-exposed areas, reapply every 2 hours as needed.  - Call if any new or changing lesions are noted between office visits   History of Basal Cell Carcinoma of the Skin Left ear superior helix and left lateral eyebrow - No evidence of recurrence today - Recommend regular full body skin exams - Recommend daily broad spectrum sunscreen SPF 30+ to sun-exposed areas, reapply every 2 hours as needed.  - Call if any new or changing lesions are noted between office visits  SKIN CANCER SCREENING PERFORMED TODAY.  Inflamed seborrheic keratosis  Actinic skin damage  History of malignant melanoma  History of basal cell carcinoma  Skin  cancer screening   Return in about 6 months (around 01/18/2023) for TBSE, hx of Melanoma, hx of BCC  .  IAngelique Holm, CMA, am acting as scribe for Armida Sans, MD .   Documentation: I have reviewed the above documentation for accuracy and completeness, and I agree with the above.  Armida Sans, MD

## 2022-07-19 NOTE — Patient Instructions (Signed)
Due to recent changes in healthcare laws, you may see results of your pathology and/or laboratory studies on MyChart before the doctors have had a chance to review them. We understand that in some cases there may be results that are confusing or concerning to you. Please understand that not all results are received at the same time and often the doctors may need to interpret multiple results in order to provide you with the best plan of care or course of treatment. Therefore, we ask that you please give us 2 business days to thoroughly review all your results before contacting the office for clarification. Should we see a critical lab result, you will be contacted sooner.   If You Need Anything After Your Visit  If you have any questions or concerns for your doctor, please call our main line at [phone] and press option 4 to reach your doctor's medical assistant. If no one answers, please leave a voicemail as directed and we will return your call as soon as possible. Messages left after 4 pm will be answered the following business day.   You may also send us a message via MyChart. We typically respond to MyChart messages within 1-2 business days.  For prescription refills, please ask your pharmacy to contact our office. Our fax number is [phone].  If you have an urgent issue when the clinic is closed that cannot wait until the next business day, you can page your doctor at the number below.    Please note that while we do our best to be available for urgent issues outside of office hours, we are not available 24/7.   If you have an urgent issue and are unable to reach us, you may choose to seek medical care at your doctor's office, retail clinic, urgent care center, or emergency room.  If you have a medical emergency, please immediately call 911 or go to the emergency department.  Pager Numbers  - Dr. Kowalski: [phone]  - Dr. Moye: [phone]  - Dr. Stewart:  [phone]  In the event of inclement weather, please call our main line at [phone] for an update on the status of any delays or closures.  Dermatology Medication Tips: Please keep the boxes that topical medications come in in order to help keep track of the instructions about where and how to use these. Pharmacies typically print the medication instructions only on the boxes and not directly on the medication tubes.   If your medication is too expensive, please contact our office at [phone] option 4 or send us a message through MyChart.   We are unable to tell what your co-pay for medications will be in advance as this is different depending on your insurance coverage. However, we may be able to find a substitute medication at lower cost or fill out paperwork to get insurance to cover a needed medication.   If a prior authorization is required to get your medication covered by your insurance company, please allow us 1-2 business days to complete this process.  Drug prices often vary depending on where the prescription is filled and some pharmacies may offer cheaper prices.  The website www.goodrx.com contains coupons for medications through different pharmacies. The prices here do not account for what the cost may be with help from insurance (it may be cheaper with your insurance), but the website can give you the price if you did not use any insurance.  - You can print the associated coupon and take it with   your prescription to the pharmacy.  - You may also stop by our office during regular business hours and pick up a GoodRx coupon card.  - If you need your prescription sent electronically to a different pharmacy, notify our office through [facility] MyChart or by phone at [phone] option 4.     Si Usted Necesita Algo Despus de Su Visita  Tambin puede enviarnos un mensaje a travs de MyChart. Por lo general respondemos a los mensajes de MyChart en el transcurso de 1 a 2  das hbiles.  Para renovar recetas, por favor pida a su farmacia que se ponga en contacto con nuestra oficina. Nuestro nmero de fax es el [phone].  Si tiene un asunto urgente cuando la clnica est cerrada y que no puede esperar hasta el siguiente da hbil, puede llamar/localizar a su doctor(a) al nmero que aparece a continuacin.   Por favor, tenga en cuenta que aunque hacemos todo lo posible para estar disponibles para asuntos urgentes fuera del horario de oficina, no estamos disponibles las 24 horas del da, los 7 das de la semana.   Si tiene un problema urgente y no puede comunicarse con nosotros, puede optar por buscar atencin mdica  en el consultorio de su doctor(a), en una clnica privada, en un centro de atencin urgente o en una sala de emergencias.  Si tiene una emergencia mdica, por favor llame inmediatamente al 911 o vaya a la sala de emergencias.  Nmeros de bper  - Dr. Kowalski: [phone]  - Dra. Moye: [phone]  - Dra. Stewart: [phone]  En caso de inclemencias del tiempo, por favor llame a nuestra lnea principal al [phone] para una actualizacin sobre el estado de cualquier retraso o cierre.  Consejos para la medicacin en dermatologa: Por favor, guarde las cajas en las que vienen los medicamentos de uso tpico para ayudarle a seguir las instrucciones sobre dnde y cmo usarlos. Las farmacias generalmente imprimen las instrucciones del medicamento slo en las cajas y no directamente en los tubos del medicamento.   Si su medicamento es muy caro, por favor, pngase en contacto con nuestra oficina llamando al [phone] y presione la opcin 4 o envenos un mensaje a travs de MyChart.   No podemos decirle cul ser su copago por los medicamentos por adelantado ya que esto es diferente dependiendo de la cobertura de su seguro. Sin embargo, es posible que podamos encontrar un medicamento sustituto a menor costo o llenar un formulario para que el  seguro cubra el medicamento que se considera necesario.   Si se requiere una autorizacin previa para que su compaa de seguros cubra su medicamento, por favor permtanos de 1 a 2 das hbiles para completar este proceso.  Los precios de los medicamentos varan con frecuencia dependiendo del lugar de dnde se surte la receta y alguna farmacias pueden ofrecer precios ms baratos.  El sitio web www.goodrx.com tiene cupones para medicamentos de diferentes farmacias. Los precios aqu no tienen en cuenta lo que podra costar con la ayuda del seguro (puede ser ms barato con su seguro), pero el sitio web puede darle el precio si no utiliz ningn seguro.  - Puede imprimir el cupn correspondiente y llevarlo con su receta a la farmacia.  - Tambin puede pasar por nuestra oficina durante el horario de atencin regular y recoger una tarjeta de cupones de GoodRx.  - Si necesita que su receta se enve electrnicamente a una farmacia diferente, informe a nuestra oficina a travs de MyChart de [facility]   o por telfono llamando al [phone] y presione la opcin 4.  

## 2022-07-31 ENCOUNTER — Encounter: Payer: Self-pay | Admitting: Dermatology

## 2022-09-08 ENCOUNTER — Other Ambulatory Visit: Payer: Self-pay

## 2022-09-08 DIAGNOSIS — K529 Noninfective gastroenteritis and colitis, unspecified: Secondary | ICD-10-CM

## 2022-09-08 DIAGNOSIS — R933 Abnormal findings on diagnostic imaging of other parts of digestive tract: Secondary | ICD-10-CM

## 2022-09-15 ENCOUNTER — Ambulatory Visit
Admission: RE | Admit: 2022-09-15 | Discharge: 2022-09-15 | Disposition: A | Discharge status: Home / Self Care | Payer: Medicare Other | Source: Ambulatory Visit | Attending: Gastroenterology | Admitting: Gastroenterology

## 2022-09-15 DIAGNOSIS — K529 Noninfective gastroenteritis and colitis, unspecified: Secondary | ICD-10-CM | POA: Diagnosis present

## 2022-09-15 DIAGNOSIS — R933 Abnormal findings on diagnostic imaging of other parts of digestive tract: Secondary | ICD-10-CM | POA: Diagnosis present

## 2022-09-15 MED ORDER — IOHEXOL 300 MG/ML  SOLN
100.0000 mL | Freq: Once | INTRAMUSCULAR | Status: AC | PRN
  Administered 2022-09-15: 100 mL via INTRAVENOUS

## 2022-09-18 ENCOUNTER — Other Ambulatory Visit: Payer: Self-pay | Admitting: Gastroenterology

## 2022-09-18 DIAGNOSIS — D7389 Other diseases of spleen: Secondary | ICD-10-CM

## 2022-09-18 DIAGNOSIS — R1900 Intra-abdominal and pelvic swelling, mass and lump, unspecified site: Secondary | ICD-10-CM

## 2022-09-18 DIAGNOSIS — K529 Noninfective gastroenteritis and colitis, unspecified: Secondary | ICD-10-CM

## 2022-09-18 DIAGNOSIS — R933 Abnormal findings on diagnostic imaging of other parts of digestive tract: Secondary | ICD-10-CM

## 2022-09-25 ENCOUNTER — Ambulatory Visit
Admission: RE | Admit: 2022-09-25 | Discharge: 2022-09-25 | Disposition: A | Discharge status: Home / Self Care | Payer: Medicare Other | Source: Ambulatory Visit | Attending: Gastroenterology | Admitting: Gastroenterology

## 2022-09-25 DIAGNOSIS — R1900 Intra-abdominal and pelvic swelling, mass and lump, unspecified site: Secondary | ICD-10-CM | POA: Insufficient documentation

## 2022-09-25 DIAGNOSIS — R933 Abnormal findings on diagnostic imaging of other parts of digestive tract: Secondary | ICD-10-CM | POA: Insufficient documentation

## 2022-09-25 DIAGNOSIS — K529 Noninfective gastroenteritis and colitis, unspecified: Secondary | ICD-10-CM | POA: Insufficient documentation

## 2022-09-25 DIAGNOSIS — D7389 Other diseases of spleen: Secondary | ICD-10-CM | POA: Insufficient documentation

## 2022-09-25 MED ORDER — GADOBUTROL 1 MMOL/ML IV SOLN
10.0000 mL | Freq: Once | INTRAVENOUS | Status: AC | PRN
Start: 2022-09-25 — End: 2022-09-25
  Administered 2022-09-25: 10 mL via INTRAVENOUS

## 2022-09-26 ENCOUNTER — Encounter: Payer: Self-pay | Admitting: *Deleted

## 2022-10-03 ENCOUNTER — Ambulatory Visit: Payer: Medicare Other | Admitting: Anesthesiology

## 2022-10-03 ENCOUNTER — Encounter: Payer: Self-pay | Admitting: *Deleted

## 2022-10-03 ENCOUNTER — Encounter
Admission: RE | Disposition: A | Discharge status: Home / Self Care | Payer: Self-pay | Source: Home / Self Care | Attending: Gastroenterology

## 2022-10-03 ENCOUNTER — Ambulatory Visit
Admission: RE | Admit: 2022-10-03 | Discharge: 2022-10-03 | Disposition: A | Discharge status: Home / Self Care | Payer: Medicare Other | Attending: Gastroenterology | Admitting: Gastroenterology

## 2022-10-03 DIAGNOSIS — Z87891 Personal history of nicotine dependence: Secondary | ICD-10-CM | POA: Diagnosis not present

## 2022-10-03 DIAGNOSIS — Z79899 Other long term (current) drug therapy: Secondary | ICD-10-CM | POA: Insufficient documentation

## 2022-10-03 DIAGNOSIS — I10 Essential (primary) hypertension: Secondary | ICD-10-CM | POA: Insufficient documentation

## 2022-10-03 DIAGNOSIS — K640 First degree hemorrhoids: Secondary | ICD-10-CM | POA: Diagnosis not present

## 2022-10-03 DIAGNOSIS — Z7984 Long term (current) use of oral hypoglycemic drugs: Secondary | ICD-10-CM | POA: Insufficient documentation

## 2022-10-03 DIAGNOSIS — G4730 Sleep apnea, unspecified: Secondary | ICD-10-CM | POA: Diagnosis not present

## 2022-10-03 DIAGNOSIS — Z8571 Personal history of Hodgkin lymphoma: Secondary | ICD-10-CM | POA: Diagnosis not present

## 2022-10-03 DIAGNOSIS — E1142 Type 2 diabetes mellitus with diabetic polyneuropathy: Secondary | ICD-10-CM | POA: Insufficient documentation

## 2022-10-03 DIAGNOSIS — Z1211 Encounter for screening for malignant neoplasm of colon: Secondary | ICD-10-CM | POA: Insufficient documentation

## 2022-10-03 DIAGNOSIS — J4489 Other specified chronic obstructive pulmonary disease: Secondary | ICD-10-CM | POA: Insufficient documentation

## 2022-10-03 DIAGNOSIS — R195 Other fecal abnormalities: Secondary | ICD-10-CM | POA: Diagnosis present

## 2022-10-03 HISTORY — DX: Unspecified asthma, uncomplicated: J45.909

## 2022-10-03 HISTORY — DX: Hyperlipidemia, unspecified: E78.5

## 2022-10-03 HISTORY — PX: COLONOSCOPY WITH PROPOFOL: SHX5780

## 2022-10-03 LAB — GLUCOSE, CAPILLARY: Glucose-Capillary: 126 mg/dL — ABNORMAL HIGH (ref 70–99)

## 2022-10-03 SURGERY — COLONOSCOPY WITH PROPOFOL
Anesthesia: General

## 2022-10-03 MED ORDER — PROPOFOL 10 MG/ML IV BOLUS
INTRAVENOUS | Status: DC | PRN
  Administered 2022-10-03: 80 mg via INTRAVENOUS
  Administered 2022-10-03: 20 mg via INTRAVENOUS

## 2022-10-03 MED ORDER — LIDOCAINE HCL (CARDIAC) PF 100 MG/5ML IV SOSY
PREFILLED_SYRINGE | INTRAVENOUS | Status: DC | PRN
  Administered 2022-10-03: 50 mg via INTRAVENOUS

## 2022-10-03 MED ORDER — GLYCOPYRROLATE 0.2 MG/ML IJ SOLN
INTRAMUSCULAR | Status: AC
  Filled 2022-10-03: qty 1

## 2022-10-03 MED ORDER — GLYCOPYRROLATE 0.2 MG/ML IJ SOLN
INTRAMUSCULAR | Status: DC | PRN
  Administered 2022-10-03: .1 mg via INTRAVENOUS

## 2022-10-03 MED ORDER — SODIUM CHLORIDE 0.9 % IV SOLN: INTRAVENOUS | Status: DC

## 2022-10-03 MED ORDER — PROPOFOL 500 MG/50ML IV EMUL
INTRAVENOUS | Status: DC | PRN
  Administered 2022-10-03: 100 ug/kg/min via INTRAVENOUS

## 2022-10-03 NOTE — Transfer of Care (Signed)
Immediate Anesthesia Transfer of Care Note  Patient: Austin Sullivan  Procedure(s) Performed: COLONOSCOPY WITH PROPOFOL  Patient Location: PACU  Anesthesia Type:General  Level of Consciousness: drowsy and responds to stimulation  Airway & Oxygen Therapy: Patient Spontanous Breathing  Post-op Assessment: Report given to RN and Post -op Vital signs reviewed and stable  Post vital signs: Reviewed and stable  Last Vitals:  Vitals Value Taken Time  BP 119/61 10/03/22 1056  Temp 35.8 C 10/03/22 1054  Pulse 76 10/03/22 1056  Resp 10 10/03/22 1056  SpO2 96 % 10/03/22 1056  Vitals shown include unvalidated device data.  Last Pain:  Vitals:   10/03/22 1054  TempSrc: Temporal  PainSc: Asleep         Complications: No notable events documented.

## 2022-10-03 NOTE — Anesthesia Preprocedure Evaluation (Signed)
Anesthesia Evaluation  Patient identified by MRN, date of birth, ID band Patient awake    Reviewed: Allergy & Precautions, H&P , NPO status , Patient's Chart, lab work & pertinent test results, reviewed documented beta blocker date and time   Airway Mallampati: II  TM Distance: >3 FB Neck ROM: full    Dental no notable dental hx.    Pulmonary shortness of breath, asthma , sleep apnea and Continuous Positive Airway Pressure Ventilation , COPD,  COPD inhaler, former smoker   Pulmonary exam normal        Cardiovascular hypertension, Pt. on home beta blockers and Pt. on medications + DOE  Normal cardiovascular exam     Neuro/Psych  Neuromuscular disease (Peripheral neuropathy)  negative psych ROS   GI/Hepatic negative GI ROS, Neg liver ROS,,,  Endo/Other  diabetes, Type 2    Renal/GU negative Renal ROS  negative genitourinary   Musculoskeletal   Abdominal  (+) + obese  Peds  Hematology hodgkins lymphoma. in remission now   Anesthesia Other Findings H/o vocal cord paralysis- wedge placed on cords so he can speak. H/o tracheostomy  Past Medical History: No date: Arthritis No date: Asthma 01/30/2019: Basal cell carcinoma     Comment:  Left ear, superior helix. Nodular and infiltrative               patterns. Simple excision/EDC. 11/03/2019: Basal cell carcinoma     Comment:  left lat brow 06/30/2021: Basal cell carcinoma     Comment:  left forehead above lat brow, schedule Mohs 01/05/2022: Basal cell carcinoma     Comment:  left superior helix, EDC No date: BPH (benign prostatic hyperplasia) No date: Chemotherapy management, encounter for     Comment:  FOR hodgkins lymphoma. in remission now No date: COPD (chronic obstructive pulmonary disease) (HCC) No date: Diabetes mellitus without complication (HCC) No date: Dyspnea     Comment:  WITH EXCERCISE No date: History of kidney stones No date: HLD  (hyperlipidemia) No date: Hodgkin's lymphoma (HCC)     Comment:  chemotherapy. now in remission No date: Hypertension No date: Irritable bowel syndrome with constipation 2001: Melanoma (HCC)     Comment:  Right forehead. Metastatic melanoma 2016 No date: Peripheral neuropathy 07/2020: Sepsis (HCC)     Comment:  developed stones after TKR and was readmit to hospital               for this No date: Sleep apnea     Comment:  USES CPAP No date: Vocal cord paralysis     Comment:  wedge placed on cords so he can speak  Past Surgical History: No date: bph No date: COLONOSCOPY 07/26/2020: CYSTOSCOPY WITH STENT PLACEMENT; Bilateral     Comment:  Procedure: CYSTOSCOPY WITH STENT PLACEMENT;  Surgeon:               Sondra Come, MD;  Location: [facility] ORS;  Service:               Urology;  Laterality: Bilateral; 08/27/2020: CYSTOSCOPY/URETEROSCOPY/HOLMIUM LASER/STENT PLACEMENT;  Bilateral     Comment:  Procedure: CYSTOSCOPY/URETEROSCOPY/HOLMIUM LASER/STENT               PLACEMENT;  Surgeon: Sondra Come, MD;  Location:               [facility] ORS;  Service: Urology;  Laterality: Bilateral; No date: EYE SURGERY     Comment:  carterat surgery x 2 No date: FRACTURE SURGERY  Comment:  both wrist ahs metal plates No date: JOINT REPLACEMENT     Comment:  left hip, LEFT  KNEE No date: nasal cavity surgery     Comment:  septoplasty No date: ORCHIECTOMY     Comment:  only 1 testicle removed d/t lump on that side 2017: port a cath place 2022: removed port a cath No date: TOTAL HIP ARTHROPLASTY; Left 07/20/2020: TOTAL KNEE ARTHROPLASTY; Left     Comment:  Procedure: TOTAL KNEE ARTHROPLASTY - [place] Neighbor to               Assist;  Surgeon: Kennedy Bucker, MD;  Location: [facility] ORS;              Service: Orthopedics;  Laterality: Left; 11/01/2021: TOTAL KNEE REVISION; Left     Comment:  Procedure: Left knee revision, polyethylene exchange;                Surgeon: Kennedy Bucker, MD;  Location:  [facility] ORS;                Service: Orthopedics;  Laterality: Left; No date: trachostomy 2017: vocal cord spacer No date: WRIST SURGERY; Bilateral     Comment:  PLATES AND SCREWS  BMI    Body Mass Index: 32.46 kg/m      Reproductive/Obstetrics negative OB ROS                             Anesthesia Physical Anesthesia Plan  ASA: 3  Anesthesia Plan: General   Post-op Pain Management:    Induction: Intravenous  PONV Risk Score and Plan: Propofol infusion and TIVA  Airway Management Planned: Natural Airway  Additional Equipment:   Intra-op Plan:   Post-operative Plan:   Informed Consent:      Dental Advisory Given  Plan Discussed with: CRNA and Surgeon  Anesthesia Plan Comments:        Anesthesia Quick Evaluation

## 2022-10-03 NOTE — Interval H&P Note (Signed)
History and Physical Interval Note:  10/03/2022 10:25 AM  Austin Sullivan  has presented today for surgery, with the diagnosis of +FIT.  The various methods of treatment have been discussed with the patient and family. After consideration of risks, benefits and other options for treatment, the patient has consented to  Procedure(s): COLONOSCOPY WITH PROPOFOL (N/A) as a surgical intervention.  The patient's history has been reviewed, patient examined, no change in status, stable for surgery.  I have reviewed the patient's chart and labs.  Questions were answered to the patient's satisfaction.     Regis Bill  Ok to proceed with colonoscopy

## 2022-10-03 NOTE — H&P (Signed)
Outpatient short stay form Pre-procedure 10/03/2022  Regis Bill, MD  Primary Physician: Kandyce Rud, MD  Reason for visit:  Positive FIT  History of present illness:    67 y/o gentleman with melanoma, hypertension, and DM II here for colonoscopy due to positive FIT. Had colonoscopy in 2018 in [place] that was unremarkable. No blood thinners. No significant abdominal surgeries. No family history of GI malignancies.    Current Facility-Administered Medications:    0.9 %  sodium chloride infusion, , Intravenous, Continuous, Bethzy Hauck, Rossie Muskrat, MD, Last Rate: 20 mL/hr at 10/03/22 0941, Restarted at 10/03/22 1014  Facility-Administered Medications Ordered in Other Encounters:    glycopyrrolate (ROBINUL) injection, , Intravenous, Anesthesia Intra-op, Jeannene Patella, CRNA, 0.1 mg at 10/03/22 1016  Medications Prior to Admission  Medication Sig Dispense Refill Last Dose   alfuzosin (UROXATRAL) 10 MG 24 hr tablet Take 10 mg by mouth daily.   10/03/2022 at 0530   aspirin EC 81 MG tablet Take 81 mg by mouth daily.   Past Week   Coenzyme Q10 100 MG capsule Take 100 mg by mouth daily.   Past Week   dutasteride (AVODART) 0.5 MG capsule Take 0.5 mg by mouth daily.   10/03/2022 at 0530   fluticasone furoate-vilanterol (BREO ELLIPTA) 100-25 MCG/INH AEPB Inhale 1 puff into the lungs daily.   10/03/2022 at 0530   gabapentin (NEURONTIN) 300 MG capsule Take 300 mg by mouth 2 (two) times daily.   10/03/2022 at 0530   metFORMIN (GLUCOPHAGE) 1000 MG tablet Take 1,000 mg by mouth 2 (two) times daily.   10/02/2022   metoprolol succinate (TOPROL-XL) 50 MG 24 hr tablet Take 50 mg by mouth daily.   10/03/2022 at 0530   telmisartan (MICARDIS) 80 MG tablet Take 80 mg by mouth daily.   10/03/2022 at 0530   Azelastine HCl 137 MCG/SPRAY SOLN Place 1 spray into the nose in the morning and at bedtime.      fexofenadine (ALLEGRA) 180 MG tablet Take 180 mg by mouth daily.      fluticasone (FLONASE) 50 MCG/ACT nasal spray  Place 2 sprays into both nostrils daily.      ipratropium (ATROVENT) 0.06 % nasal spray Place 2 sprays into both nostrils 2 (two) times daily.      ipratropium-albuterol (DUONEB) 0.5-2.5 (3) MG/3ML SOLN Inhale 3 mLs into the lungs 2 (two) times daily as needed for shortness of breath.      meloxicam (MOBIC) 15 MG tablet Take 15 mg by mouth daily.      montelukast (SINGULAIR) 10 MG tablet Take 10 mg by mouth at bedtime.      ondansetron (ZOFRAN) 4 MG tablet Take 1 tablet (4 mg total) by mouth every 6 (six) hours as needed for nausea. 20 tablet 0    pregabalin (LYRICA) 50 MG capsule Take 50 mg by mouth See admin instructions. 50 mg with each meal, 100 mg at bedtime      ramipril (ALTACE) 10 MG capsule Take 2 capsules (20 mg total) by mouth daily. Hold for now, resume when ok with your pcp. (Patient taking differently: Take 20 mg by mouth daily.)      rosuvastatin (CRESTOR) 40 MG tablet Take 40 mg by mouth at bedtime.      sildenafil (VIAGRA) 100 MG tablet Take 1 tablet (100 mg total) by mouth daily as needed for erectile dysfunction (take 45 minutes prior to sexual activity). 30 tablet 6      Allergies  Allergen Reactions  Bee Venom Swelling   Cefazolin Hives and Rash     Past Medical History:  Diagnosis Date   Arthritis    Asthma    Basal cell carcinoma 01/30/2019   Left ear, superior helix. Nodular and infiltrative patterns. Simple excision/EDC.   Basal cell carcinoma 11/03/2019   left lat brow   Basal cell carcinoma 06/30/2021   left forehead above lat brow, schedule Mohs   Basal cell carcinoma 01/05/2022   left superior helix, EDC   BPH (benign prostatic hyperplasia)    Chemotherapy management, encounter for    FOR hodgkins lymphoma. in remission now   COPD (chronic obstructive pulmonary disease) (HCC)    Diabetes mellitus without complication (HCC)    Dyspnea    WITH EXCERCISE   History of kidney stones    HLD (hyperlipidemia)    Hodgkin's lymphoma (HCC)    chemotherapy.  now in remission   Hypertension    Irritable bowel syndrome with constipation    Melanoma (HCC) 2001   Right forehead. Metastatic melanoma 2016   Peripheral neuropathy    Sepsis (HCC) 07/2020   developed stones after TKR and was readmit to hospital for this   Sleep apnea    USES CPAP   Vocal cord paralysis    wedge placed on cords so he can speak    Review of systems:  Otherwise negative.    Physical Exam  Gen: Alert, oriented. Appears stated age.  HEENT: PERRLA. Lungs: No respiratory distress CV: RRR Abd: soft, benign, no masses Ext: No edema    Planned procedures: Proceed with colonoscopy. The patient understands the nature of the planned procedure, indications, risks, alternatives and potential complications including but not limited to bleeding, infection, perforation, damage to internal organs and possible oversedation/side effects from anesthesia. The patient agrees and gives consent to proceed.  Please refer to procedure notes for findings, recommendations and patient disposition/instructions.     Regis Bill, MD [facility] Gastroenter[facility]

## 2022-10-03 NOTE — Anesthesia Postprocedure Evaluation (Signed)
Anesthesia Post Note  Patient: Austin Sullivan  Procedure(s) Performed: COLONOSCOPY WITH PROPOFOL  Patient location during evaluation: Endoscopy Anesthesia Type: General Level of consciousness: awake and alert Pain management: pain level controlled Vital Signs Assessment: post-procedure vital signs reviewed and stable Respiratory status: spontaneous breathing, nonlabored ventilation and respiratory function stable Cardiovascular status: blood pressure returned to baseline and stable Postop Assessment: no apparent nausea or vomiting Anesthetic complications: no   No notable events documented.   Last Vitals:  Vitals:   10/03/22 1104 10/03/22 1114  BP: 119/65 (!) 146/74  Pulse:    Resp:    Temp:    SpO2:      Last Pain:  Vitals:   10/03/22 1114  TempSrc:   PainSc: 0-No pain                 Foye Deer

## 2022-10-03 NOTE — Op Note (Signed)
[facility] Gastroenterology Patient Name: Austin Sullivan Procedure Date: 10/03/2022 10:11 AM MRN: [id number] Account #: [id number] Date of Birth: May 01, 1955 Admit Type: Outpatient Age: 67 Room: [facility] ENDO ROOM 1 Gender: Male Note Status: Finalized Instrument Name: Colonoscope [id number] Procedure:             Colonoscopy Indications:           Positive fecal immunochemical test Providers:             Eather Colas MD, MD Referring MD:          Josph Macho (Referring MD) Medicines:             Monitored Anesthesia Care Complications:         No immediate complications. Procedure:             Pre-Anesthesia Assessment:                        - Prior to the procedure, a History and Physical was                         performed, and patient medications and allergies were                         reviewed. The patient is competent. The risks and                         benefits of the procedure and the sedation options and                         risks were discussed with the patient. All questions                         were answered and informed consent was obtained.                         Patient identification and proposed procedure were                         verified by the physician, the nurse, the                         anesthesiologist, the anesthetist and the technician                         in the endoscopy suite. Mental Status Examination:                         alert and oriented. Airway Examination: normal                         oropharyngeal airway and neck mobility. Respiratory                         Examination: clear to auscultation. CV Examination:                         normal. Prophylactic Antibiotics: The patient does not  require prophylactic antibiotics. Prior                         Anticoagulants: The patient has taken no anticoagulant                         or antiplatelet agents. ASA Grade Assessment: III  - A                         patient with severe systemic disease. After reviewing                         the risks and benefits, the patient was deemed in                         satisfactory condition to undergo the procedure. The                         anesthesia plan was to use monitored anesthesia care                         (MAC). Immediately prior to administration of                         medications, the patient was re-assessed for adequacy                         to receive sedatives. The heart rate, respiratory                         rate, oxygen saturations, blood pressure, adequacy of                         pulmonary ventilation, and response to care were                         monitored throughout the procedure. The physical                         status of the patient was re-assessed after the                         procedure.                        After obtaining informed consent, the colonoscope was                         passed under direct vision. Throughout the procedure,                         the patient's blood pressure, pulse, and oxygen                         saturations were monitored continuously. The                         Colonoscope was introduced through the anus and  advanced to the 15 cm into the ileum. The colonoscopy                         was performed without difficulty. The patient                         tolerated the procedure well. The quality of the bowel                         preparation was good. The terminal ileum, ileocecal                         valve, appendiceal orifice, and rectum were                         photographed. Findings:      The perianal and digital rectal examinations were normal.      The terminal ileum appeared normal.      Internal hemorrhoids were found during retroflexion. The hemorrhoids       were Grade I (internal hemorrhoids that do not prolapse).      The exam was otherwise  without abnormality on direct and retroflexion       views. Impression:            - The examined portion of the ileum was normal.                        - Internal hemorrhoids.                        - The examination was otherwise normal on direct and                         retroflexion views.                        - No specimens collected. Recommendation:        - Discharge patient to home.                        - Resume previous diet.                        - Continue present medications.                        - Repeat colonoscopy in 10 years for screening                         purposes.                        - Return to referring physician as previously                         scheduled. Procedure Code(s):     --- Professional ---                        [phone], Colonoscopy, flexible; diagnostic, including  collection of specimen(s) by brushing or washing, when                         performed (separate procedure) Diagnosis Code(s):     --- Professional ---                        K64.0, First degree hemorrhoids                        R19.5, Other fecal abnormalities CPT copyright 2022 [employer]. All rights reserved. The codes documented in this report are preliminary and upon coder review may  be revised to meet current compliance requirements. Eather Colas MD, MD 10/03/2022 10:55:19 AM Number of Addenda: 0 Note Initiated On: 10/03/2022 10:11 AM Scope Withdrawal Time: 0 hours 9 minutes 45 seconds  Total Procedure Duration: 0 hours 14 minutes 37 seconds  Estimated Blood Loss:  Estimated blood loss: none.      [facility]

## 2023-01-24 ENCOUNTER — Ambulatory Visit: Payer: Medicare Other | Admitting: Dermatology

## 2023-01-24 DIAGNOSIS — L570 Actinic keratosis: Secondary | ICD-10-CM | POA: Diagnosis not present

## 2023-01-24 DIAGNOSIS — L918 Other hypertrophic disorders of the skin: Secondary | ICD-10-CM | POA: Diagnosis not present

## 2023-01-24 DIAGNOSIS — D1801 Hemangioma of skin and subcutaneous tissue: Secondary | ICD-10-CM

## 2023-01-24 DIAGNOSIS — Z85820 Personal history of malignant melanoma of skin: Secondary | ICD-10-CM

## 2023-01-24 DIAGNOSIS — W908XXA Exposure to other nonionizing radiation, initial encounter: Secondary | ICD-10-CM

## 2023-01-24 DIAGNOSIS — Z85828 Personal history of other malignant neoplasm of skin: Secondary | ICD-10-CM

## 2023-01-24 DIAGNOSIS — D229 Melanocytic nevi, unspecified: Secondary | ICD-10-CM

## 2023-01-24 DIAGNOSIS — Z1283 Encounter for screening for malignant neoplasm of skin: Secondary | ICD-10-CM

## 2023-01-24 DIAGNOSIS — L578 Other skin changes due to chronic exposure to nonionizing radiation: Secondary | ICD-10-CM | POA: Diagnosis not present

## 2023-01-24 DIAGNOSIS — Z872 Personal history of diseases of the skin and subcutaneous tissue: Secondary | ICD-10-CM

## 2023-01-24 DIAGNOSIS — L820 Inflamed seborrheic keratosis: Secondary | ICD-10-CM | POA: Diagnosis not present

## 2023-01-24 DIAGNOSIS — L814 Other melanin hyperpigmentation: Secondary | ICD-10-CM

## 2023-01-24 DIAGNOSIS — L821 Other seborrheic keratosis: Secondary | ICD-10-CM

## 2023-01-24 DIAGNOSIS — D692 Other nonthrombocytopenic purpura: Secondary | ICD-10-CM

## 2023-01-24 NOTE — Progress Notes (Signed)
Follow-Up Visit   Subjective  Austin Sullivan is a 67 y.o. male who presents for the following: Skin Cancer Screening and Full Body Skin Exam hx of bcc, hx of melanoma, hx of isks  Reports scaly areas at right ear and right cheek.   The patient presents for Total-Body Skin Exam (TBSE) for skin cancer screening and mole check. The patient has spots, moles and lesions to be evaluated, some may be new or changing and the patient may have concern these could be cancer.    The following portions of the chart were reviewed this encounter and updated as appropriate: medications, allergies, medical history  Review of Systems:  No other skin or systemic complaints except as noted in HPI or Assessment and Plan.  Objective  Well appearing patient in no apparent distress; mood and affect are within normal limits.  A full examination was performed including scalp, head, eyes, ears, nose, lips, neck, chest, axillae, abdomen, back, buttocks, bilateral upper extremities, bilateral lower extremities, hands, feet, fingers, toes, fingernails, and toenails. All findings within normal limits unless otherwise noted below.   Relevant physical exam findings are noted in the Assessment and Plan.  right ear x 1, right cheek x 1, right brow x 1 (3) Erythematous thin papules/macules with gritty scale.   left forearm x 1 Erythematous stuck-on, waxy papule or plaque    Assessment & Plan   SKIN CANCER SCREENING PERFORMED TODAY.  Acrochordons (Skin Tags) - Fleshy, skin-colored pedunculated papules - Benign appearing.  - Observe. - If desired, they can be removed with an in office procedure that is not covered by insurance. - Please call the clinic if you notice any new or changing lesions.   ACTINIC DAMAGE - Chronic condition, secondary to cumulative UV/sun exposure - diffuse scaly erythematous macules with underlying dyspigmentation - Recommend daily broad spectrum sunscreen SPF 30+ to sun-exposed  areas, reapply every 2 hours as needed.  - Staying in the shade or wearing long sleeves, sun glasses (UVA+UVB protection) and wide brim hats (4-inch brim around the entire circumference of the hat) are also recommended for sun protection.  - Call for new or changing lesions.  LENTIGINES, SEBORRHEIC KERATOSES, HEMANGIOMAS - Benign normal skin lesions - Benign-appearing - Call for any changes  MELANOCYTIC NEVI - Tan-brown and/or pink-flesh-colored symmetric macules and papules - Benign appearing on exam today - Observation - Call clinic for new or changing moles - Recommend daily use of broad spectrum spf 30+ sunscreen to sun-exposed areas.   Purpura - Chronic; persistent and recurrent.  Treatable, but not curable. - Violaceous macules and patches - Benign - Related to trauma, age, sun damage and/or use of blood thinners, chronic use of topical and/or oral steroids - Observe - Can use OTC arnica containing moisturizer such as Dermend Bruise Formula if desired - Call for worsening or other concerns   History of Melanoma -metastatic -status post surgery; chemotherapy; Keytruda  In remission  Followed by Dr. Dan Europe at [facility] and here  Reviewed outside medical records today. Started at right forehead 2001 - No evidence of recurrence today - No lymphadenopathy - Recommend regular full body skin exams - Recommend daily broad spectrum sunscreen SPF 30+ to sun-exposed areas, reapply every 2 hours as needed.  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  - Call if any new or changing lesions are noted between office visits  Actinic keratosis (3) right ear x 1, right cheek x 1,  right brow x 1  Actinic keratoses are precancerous spots that appear secondary to cumulative UV radiation exposure/sun exposure over time. They are chronic with expected duration over 1 year. A portion of actinic keratoses will progress to squamous cell carcinoma of the skin. It is not possible to reliably predict which spots will progress to skin cancer and so treatment is recommended to prevent development of skin cancer.  Recommend daily broad spectrum sunscreen SPF 30+ to sun-exposed areas, reapply every 2 hours as needed.  Recommend staying in the shade or wearing long sleeves, sun glasses (UVA+UVB protection) and wide brim hats (4-inch brim around the entire circumference of the hat). Call for new or changing lesions.  Destruction of lesion - right ear x 1, right cheek x 1, right brow x 1 (3) Complexity: simple   Destruction method: cryotherapy   Informed consent: discussed and consent obtained   Timeout:  patient name, date of birth, surgical site, and procedure verified Lesion destroyed using liquid nitrogen: Yes   Region frozen until ice ball extended beyond lesion: Yes   Outcome: patient tolerated procedure well with no complications   Post-procedure details: wound care instructions given    Inflamed seborrheic keratosis left forearm x 1  Symptomatic, irritating, patient would like treated.  Destruction of lesion - left forearm x 1 Complexity: simple   Destruction method: cryotherapy   Informed consent: discussed and consent obtained   Timeout:  patient name, date of birth, surgical site, and procedure verified Lesion destroyed using liquid nitrogen: Yes   Region frozen until ice ball extended beyond lesion: Yes   Outcome: patient tolerated procedure well with no complications   Post-procedure details: wound care instructions given     Return in about 1 year (around 01/24/2024).  IAsher Muir, CMA, am acting as scribe for Armida Sans, MD.   Documentation: I  have reviewed the above documentation for accuracy and completeness, and I agree with the above.  Armida Sans, MD

## 2023-01-24 NOTE — Patient Instructions (Addendum)

## 2023-02-06 ENCOUNTER — Encounter: Payer: Self-pay | Admitting: Dermatology

## 2023-03-07 ENCOUNTER — Ambulatory Visit: Payer: Medicare Other | Admitting: Urology

## 2023-03-08 ENCOUNTER — Ambulatory Visit: Payer: Medicare Other | Admitting: Urology

## 2023-03-08 ENCOUNTER — Encounter: Payer: Self-pay | Admitting: Urology

## 2023-03-08 VITALS — BP 133/61 | HR 97 | Ht 73.0 in | Wt 251.1 lb

## 2023-03-08 DIAGNOSIS — N529 Male erectile dysfunction, unspecified: Secondary | ICD-10-CM

## 2023-03-08 DIAGNOSIS — Z125 Encounter for screening for malignant neoplasm of prostate: Secondary | ICD-10-CM | POA: Diagnosis not present

## 2023-03-08 DIAGNOSIS — N200 Calculus of kidney: Secondary | ICD-10-CM

## 2023-03-08 DIAGNOSIS — N401 Enlarged prostate with lower urinary tract symptoms: Secondary | ICD-10-CM

## 2023-03-08 DIAGNOSIS — N138 Other obstructive and reflux uropathy: Secondary | ICD-10-CM

## 2023-03-08 DIAGNOSIS — Z87442 Personal history of urinary calculi: Secondary | ICD-10-CM

## 2023-03-08 LAB — BLADDER SCAN AMB NON-IMAGING

## 2023-03-08 NOTE — Progress Notes (Signed)
   03/08/2023 9:28 AM   Juanetta Snow 26-Oct-1955 [id number]  Reason for visit: Follow up nephrolithiasis, BPH, PSA screening, ED  HPI: 67 year old male who I originally met in April 2022 when he presented with sepsis from urinary source and underwent bilateral ureteral stent placement for a left proximal ureteral stone and right UPJ stone.  He ultimately underwent definitive management with bilateral ureteroscopy in May 2022, and stents were removed 1 week later.  He has BPH that has been managed by his PCP long-term with maximal medical therapy on alfuzosin and dutasteride.  Urinary symptoms are stable, and prostate was short and open appearing on cystoscopy.  He has had some increase in urinary frequency since starting torsemide last week.  PVR today normal at .  Recent PSA on 03/08/2022 was 0.3, corrected for dutasteride 0.6, and stable from prior.  Reassurance provided  He denies any stone episodes, gross hematuria, or flank pain. We discussed general stone prevention strategies including adequate hydration with goal of producing 2.5 L of urine daily, increasing citric acid intake, increasing calcium intake during high oxalate meals, minimizing animal protein, and decreasing salt intake. Information about dietary recommendations given today.   I personally viewed and interpreted the recent CT abdomen pelvis from the [facility] system on 02/19/2023 performed for history of melanoma which shows no hydronephrosis, and small minimal punctate nonobstructing renal stones bilaterally.  In terms of his ED, previously tried Cialis with no improvement.  At her last visit we trialed sildenafil 100 mg on demand, and he was able to achieve some erections using the sildenafil.  He and his wife are no longer sexually active, not interested in further treatments for ED at this time.  -Continue alfuzosin and dutasteride for BPH, filled by PCP -Follow-up with urology as needed, return precautions were  discussed   Sondra Come, MD  [facility] [address] [place], [place] [address] [phone]

## 2023-03-14 ENCOUNTER — Ambulatory Visit: Payer: Medicare Other | Admitting: Urology

## 2023-04-04 DIAGNOSIS — M7581 Other shoulder lesions, right shoulder: Secondary | ICD-10-CM

## 2023-04-04 DIAGNOSIS — M19011 Primary osteoarthritis, right shoulder: Secondary | ICD-10-CM

## 2023-04-04 HISTORY — DX: Other shoulder lesions, right shoulder: M75.81

## 2023-04-04 HISTORY — DX: Primary osteoarthritis, right shoulder: M19.011

## 2023-05-29 ENCOUNTER — Other Ambulatory Visit: Payer: Self-pay | Admitting: Surgery

## 2023-05-29 DIAGNOSIS — M7581 Other shoulder lesions, right shoulder: Secondary | ICD-10-CM

## 2023-05-29 DIAGNOSIS — M25511 Pain in right shoulder: Secondary | ICD-10-CM

## 2023-05-30 ENCOUNTER — Ambulatory Visit
Admission: RE | Admit: 2023-05-30 | Discharge: 2023-05-30 | Disposition: A | Discharge status: Home / Self Care | Payer: Medicare Other | Source: Ambulatory Visit | Attending: Surgery | Admitting: Surgery

## 2023-05-30 DIAGNOSIS — M7581 Other shoulder lesions, right shoulder: Secondary | ICD-10-CM | POA: Insufficient documentation

## 2023-05-30 DIAGNOSIS — M25511 Pain in right shoulder: Secondary | ICD-10-CM | POA: Diagnosis present

## 2023-08-08 ENCOUNTER — Ambulatory Visit: Payer: Medicare Other | Admitting: Dermatology

## 2023-08-20 ENCOUNTER — Ambulatory Visit: Admitting: Dermatology

## 2023-08-20 ENCOUNTER — Encounter: Payer: Self-pay | Admitting: Dermatology

## 2023-08-20 DIAGNOSIS — Z85828 Personal history of other malignant neoplasm of skin: Secondary | ICD-10-CM

## 2023-08-20 DIAGNOSIS — L821 Other seborrheic keratosis: Secondary | ICD-10-CM

## 2023-08-20 DIAGNOSIS — Z7189 Other specified counseling: Secondary | ICD-10-CM

## 2023-08-20 DIAGNOSIS — L570 Actinic keratosis: Secondary | ICD-10-CM

## 2023-08-20 DIAGNOSIS — L578 Other skin changes due to chronic exposure to nonionizing radiation: Secondary | ICD-10-CM

## 2023-08-20 DIAGNOSIS — W908XXA Exposure to other nonionizing radiation, initial encounter: Secondary | ICD-10-CM | POA: Diagnosis not present

## 2023-08-20 DIAGNOSIS — Z1283 Encounter for screening for malignant neoplasm of skin: Secondary | ICD-10-CM

## 2023-08-20 DIAGNOSIS — D1801 Hemangioma of skin and subcutaneous tissue: Secondary | ICD-10-CM

## 2023-08-20 DIAGNOSIS — Z85820 Personal history of malignant melanoma of skin: Secondary | ICD-10-CM

## 2023-08-20 DIAGNOSIS — Z5111 Encounter for antineoplastic chemotherapy: Secondary | ICD-10-CM

## 2023-08-20 DIAGNOSIS — D229 Melanocytic nevi, unspecified: Secondary | ICD-10-CM

## 2023-08-20 DIAGNOSIS — L814 Other melanin hyperpigmentation: Secondary | ICD-10-CM

## 2023-08-20 DIAGNOSIS — L820 Inflamed seborrheic keratosis: Secondary | ICD-10-CM

## 2023-08-20 DIAGNOSIS — Z79899 Other long term (current) drug therapy: Secondary | ICD-10-CM

## 2023-08-20 MED ORDER — FLUOROURACIL 5 % EX CREA: TOPICAL_CREAM | CUTANEOUS | 2 refills | Status: DC

## 2023-08-20 NOTE — Patient Instructions (Signed)
 Starting after July 4th, 2025 - Start 5-fluorouracil /calcipotriene cream twice a day for 7 days to affected areas including left temple. Prescription sent to [facility]. Patient advised they will receive an email to purchase the medication online and have it sent to their home. Patient provided with handout reviewing treatment course and side effects and advised to call or message us  on MyChart with any concerns.  Reviewed course of treatment and expected reaction.  Patient advised to expect inflammation and crusting and advised that erosions are possible.  Patient advised to be diligent with sun protection during and after treatment. Counseled to keep medication out of reach of children and pets.    Instructions for [facility] Medications  One or more of your medications was sent to the [facility] mail order [facility] pharmacy. You will receive an email from them and can purchase the medicine through that link. It will then be mailed to your home at the address you confirmed. If for any reason you do not receive an email from them, please check your spam folder. If you still do not find the email, please let us  know. [facility] phone number is [phone].    5-Fluorouracil /Calcipotriene Patient Education   Actinic keratoses are the dry, red scaly spots on the skin caused by sun damage. A portion of these spots can turn into skin cancer with time, and treating them can help prevent development of skin cancer.   Treatment of these spots requires removal of the defective skin cells. There are various ways to remove actinic keratoses, including freezing with liquid nitrogen, treatment with creams, or treatment with a blue light procedure in the office.   5-fluorouracil  cream is a topical cream used to treat actinic keratoses. It works by interfering with the growth of abnormal fast-growing skin cells, such as actinic keratoses. These cells peel off and are  replaced by healthy ones.   5-fluorouracil /calcipotriene is a combination of the 5-fluorouracil  cream with a vitamin D analog cream called calcipotriene. The calcipotriene alone does not treat actinic keratoses. However, when it is combined with 5-fluorouracil , it helps the 5-fluorouracil  treat the actinic keratoses much faster so that the same results can be achieved with a much shorter treatment time.  INSTRUCTIONS FOR 5-FLUOROURACIL /CALCIPOTRIENE CREAM:   5-fluorouracil /calcipotriene cream typically only needs to be used for 4-7 days. A thin layer should be applied twice a day to the treatment areas recommended by your physician.   If your physician prescribed you separate tubes of 5-fluourouracil and calcipotriene, apply a thin layer of 5-fluorouracil  followed by a thin layer of calcipotriene.   Avoid contact with your eyes, nostrils, and mouth. Do not use 5-fluorouracil /calcipotriene cream on infected or open wounds.   You will develop redness, irritation and some crusting at areas where you have pre-cancer damage/actinic keratoses. IF YOU DEVELOP PAIN, BLEEDING, OR SIGNIFICANT CRUSTING, STOP THE TREATMENT EARLY - you have already gotten a good response and the actinic keratoses should clear up well.  Wash your hands after applying 5-fluorouracil  5% cream on your skin.   A moisturizer or sunscreen with a minimum SPF 30 should be applied each morning.   Once you have finished the treatment, you can apply a thin layer of Vaseline twice a day to irritated areas to soothe and calm the areas more quickly. If you experience significant discomfort, contact your physician.  For some patients it is necessary to repeat the treatment for best results.  SIDE EFFECTS: When using 5-fluorouracil /calcipotriene cream, you may  have mild irritation, such as redness, dryness, swelling, or a mild burning sensation. This usually resolves within 2 weeks. The more actinic keratoses you have, the more redness and  inflammation you can expect during treatment. Eye irritation has been reported rarely. If this occurs, please let us  know.  If you have any trouble using this cream, please call the office. If you have any other questions about this information, please do not hesitate to ask me before you leave the office.    Recommend daily broad spectrum sunscreen SPF 30+ to sun-exposed areas, reapply every 2 hours as needed. Call for new or changing lesions.  Staying in the shade or wearing long sleeves, sun glasses (UVA+UVB protection) and wide brim hats (4-inch brim around the entire circumference of the hat) are also recommended for sun protection.     Melanoma ABCDEs  Melanoma is the most dangerous type of skin cancer, and is the leading cause of death from skin disease.  You are more likely to develop melanoma if you: Have light-colored skin, light-colored eyes, or red or blond hair Spend a lot of time in the sun Tan regularly, either outdoors or in a tanning bed Have had blistering sunburns, especially during childhood Have a close family member who has had a melanoma Have atypical moles or large birthmarks  Early detection of melanoma is key since treatment is typically straightforward and cure rates are extremely high if we catch it early.   The first sign of melanoma is often a change in a mole or a new dark spot.  The ABCDE system is a way of remembering the signs of melanoma.  A for asymmetry:  The two halves do not match. B for border:  The edges of the growth are irregular. C for color:  A mixture of colors are present instead of an even brown color. D for diameter:  Melanomas are usually (but not always) greater than 6mm - the size of a pencil eraser. E for evolution:  The spot keeps changing in size, shape, and color.  Please check your skin once per month between visits. You can use a small mirror in front and a large mirror behind you to keep an eye on the back side or your body.    If you see any new or changing lesions before your next follow-up, please call to schedule a visit.  Please continue daily skin protection including broad spectrum sunscreen SPF 30+ to sun-exposed areas, reapplying every 2 hours as needed when you're outdoors.   Staying in the shade or wearing long sleeves, sun glasses (UVA+UVB protection) and wide brim hats (4-inch brim around the entire circumference of the hat) are also recommended for sun protection.       Due to recent changes in healthcare laws, you may see results of your pathology and/or laboratory studies on MyChart before the doctors have had a chance to review them. We understand that in some cases there may be results that are confusing or concerning to you. Please understand that not all results are received at the same time and often the doctors may need to interpret multiple results in order to provide you with the best plan of care or course of treatment. Therefore, we ask that you please give us  2 business days to thoroughly review all your results before contacting the office for clarification. Should we see a critical lab result, you will be contacted sooner.   If You Need Anything After Your Visit  If  you have any questions or concerns for your doctor, please call our main line at [phone] and press option 4 to reach your doctor's medical assistant. If no one answers, please leave a voicemail as directed and we will return your call as soon as possible. Messages left after 4 pm will be answered the following business day.   You may also send us  a message via MyChart. We typically respond to MyChart messages within 1-2 business days.  For prescription refills, please ask your pharmacy to contact our office. Our fax number is [phone].  If you have an urgent issue when the clinic is closed that cannot wait until the next business day, you can page your doctor at the number below.    Please note that while we do our  best to be available for urgent issues outside of office hours, we are not available 24/7.   If you have an urgent issue and are unable to reach us , you may choose to seek medical care at your doctor's office, retail clinic, urgent care center, or emergency room.  If you have a medical emergency, please immediately call 911 or go to the emergency department.  Pager Numbers  - Dr. Bary Likes: [phone]  - Dr. Annette Barters: [phone]  - Dr. Felipe Horton: [phone]   In the event of inclement weather, please call our main line at [phone] for an update on the status of any delays or closures.  Dermatology Medication Tips: Please keep the boxes that topical medications come in in order to help keep track of the instructions about where and how to use these. Pharmacies typically print the medication instructions only on the boxes and not directly on the medication tubes.   If your medication is too expensive, please contact our office at [phone] option 4 or send us  a message through MyChart.   We are unable to tell what your co-pay for medications will be in advance as this is different depending on your insurance coverage. However, we may be able to find a substitute medication at lower cost or fill out paperwork to get insurance to cover a needed medication.   If a prior authorization is required to get your medication covered by your insurance company, please allow us  1-2 business days to complete this process.  Drug prices often vary depending on where the prescription is filled and some pharmacies may offer cheaper prices.  The website www.goodrx.com contains coupons for medications through different pharmacies. The prices here do not account for what the cost may be with help from insurance (it may be cheaper with your insurance), but the website can give you the price if you did not use any insurance.  - You can print the associated coupon and take it with your prescription to the  pharmacy.  - You may also stop by our office during regular business hours and pick up a GoodRx coupon card.  - If you need your prescription sent electronically to a different pharmacy, notify our office through [facility] or by phone at [phone] option 4.     Si Usted Necesita Algo Despus de Su Visita  Tambin puede enviarnos un mensaje a travs de Clinical cytogeneticist. Por lo general respondemos a los mensajes de MyChart en el transcurso de 1 a 2 das hbiles.  Para renovar recetas, por favor pida a su farmacia que se ponga en contacto con nuestra oficina. Franz Jacks de fax es [place] [phone].  Si tiene un asunto urgente cuando la clnica est  cerrada y que no puede esperar hasta el siguiente da hbil, puede llamar/localizar a su doctor(a) al nmero que aparece a continuacin.   Por favor, tenga en cuenta que aunque hacemos todo lo posible para estar disponibles para asuntos urgentes fuera del horario de [place], no estamos disponibles las 24 horas del da, los 7 [address]  [address] de la [place].   Si tiene un problema urgente y no puede comunicarse con nosotros, puede optar por buscar atencin mdica  en el consultorio de su doctor(a), en una clnica privada, en un centro de atencin urgente o en una sala de emergencias.  Si tiene Engineer, drilling, por favor llame inmediatamente al 911 o vaya a la sala de emergencias.  Nmeros de bper  - Dr. Bary Likes: [phone]  - Dra. Annette Barters: [phone]  - Dr. Felipe Horton: [phone]   En caso de inclemencias del tiempo, por favor llame a Lajuan Pila principal al [phone] para una actualizacin sobre el [place] de cualquier retraso o cierre.  Consejos para la medicacin en dermatologa: Por favor, guarde las cajas en las que vienen los medicamentos de uso tpico para ayudarle a seguir las instrucciones sobre dnde y cmo usarlos. Las farmacias generalmente imprimen las instrucciones del medicamento slo en las cajas y no directamente en los  tubos del [place].   Si su medicamento es muy caro, por favor, pngase en contacto con Bettyjane Brunet llamando al [phone] y presione la opcin 4 o envenos un mensaje a travs de Clinical cytogeneticist.   No podemos decirle cul ser su copago por los medicamentos por adelantado ya que esto es diferente dependiendo de la cobertura de su seguro. Sin embargo, es posible que podamos encontrar un medicamento sustituto a Audiological scientist un formulario para que el seguro cubra el medicamento que se considera necesario.   Si se requiere una autorizacin previa para que su compaa de seguros [place] su medicamento, por favor permtanos de 1 a 2 das hbiles para completar este proceso.  Los precios de los medicamentos varan con frecuencia dependiendo del Environmental consultant de dnde se surte la receta y alguna farmacias pueden ofrecer precios ms baratos.  El sitio web www.goodrx.com tiene cupones para medicamentos de Health and safety inspector. Los precios aqu no tienen en cuenta lo que podra costar con la ayuda del seguro (puede ser ms barato con su seguro), pero el sitio web puede darle el precio si no utiliz Tourist information centre manager.  - Puede imprimir el cupn correspondiente y llevarlo con su receta a la farmacia.  - Tambin puede pasar por nuestra oficina durante el horario de atencin regular y Education officer, museum una tarjeta de cupones de GoodRx.  - Si necesita que su receta se enve electrnicamente a una farmacia diferente, informe a nuestra oficina a travs de MyChart de [place] o por telfono llamando al [phone] y presione la opcin 4.

## 2023-08-20 NOTE — Progress Notes (Signed)
 Follow-Up Visit   Subjective  Austin Sullivan is a 68 y.o. male who presents for the following: Skin Cancer Screening and Full Body Skin Exam. Hx of metastatic melanoma, Hx of BCCs.  The patient presents for Total-Body Skin Exam (TBSE) for skin cancer screening and mole check. The patient has spots, moles and lesions to be evaluated, some may be new or changing and the patient may have concern these could be cancer.  The following portions of the chart were reviewed this encounter and updated as appropriate: medications, allergies, medical history  Review of Systems:  No other skin or systemic complaints except as noted in HPI or Assessment and Plan.  Objective  Well appearing patient in no apparent distress; mood and affect are within normal limits.  A full examination was performed including scalp, head, eyes, ears, nose, lips, neck, chest, axillae, abdomen, back, buttocks, bilateral upper extremities, bilateral lower extremities, hands, feet, fingers, toes, fingernails, and toenails. All findings within normal limits unless otherwise noted below.   Relevant physical exam findings are noted in the Assessment and Plan.  Left Lateral Forehead x1, scalp x1 (2) Erythematous thin papules/macules with gritty scale.  Left Infra-pectoral x3, R back x1 (4) Erythematous keratotic or waxy stuck-on papule or plaque.  Assessment & Plan   SKIN CANCER SCREENING PERFORMED TODAY.  History of Melanoma. Right forehead, primary site -metastatic -status post surgery; chemotherapy; Keytruda  In remission  Followed by Dr. Phill Brazil at [facility] and here  Reviewed outside medical records today. - No evidence of recurrence today - No lymphadenopathy - Recommend regular full body skin exams - Recommend daily broad spectrum sunscreen SPF 30+ to sun-exposed areas, reapply every 2 hours as needed.  - Call if any new or changing lesions are noted between office visits  HISTORY OF BASAL CELL CARCINOMA OF THE  SKIN. Left ear superior helix and left lateral eyebrow  - No evidence of recurrence today - Recommend regular full body skin exams - Recommend daily broad spectrum sunscreen SPF 30+ to sun-exposed areas, reapply every 2 hours as needed.  - Call if any new or changing lesions are noted between office visits   LENTIGINES, SEBORRHEIC KERATOSES, HEMANGIOMAS - Benign normal skin lesions - Benign-appearing - Call for any changes  MELANOCYTIC NEVI - Tan-brown and/or pink-flesh-colored symmetric macules and papules - Benign appearing on exam today - Observation - Call clinic for new or changing moles - Recommend daily use of broad spectrum spf 30+ sunscreen to sun-exposed areas.   AK (ACTINIC KERATOSIS) (2) Left Lateral Forehead x1, scalp x1 (2) ACTINIC DAMAGE WITH PRECANCEROUS ACTINIC KERATOSES Counseling for Topical Chemotherapy Management: Patient exhibits: - Severe, confluent actinic changes with pre-cancerous actinic keratoses that is secondary to cumulative UV radiation exposure over time - Condition that is severe; chronic, not at goal. - diffuse scaly erythematous macules and papules with underlying dyspigmentation - Discussed Prescription "Field Treatment" topical Chemotherapy for Severe, Chronic Confluent Actinic Changes with Pre-Cancerous Actinic Keratoses Field treatment involves treatment of an entire area of skin that has confluent Actinic Changes (Sun/ Ultraviolet light damage) and PreCancerous Actinic Keratoses by method of PhotoDynamic Therapy (PDT) and/or prescription Topical Chemotherapy agents such as 5-fluorouracil , 5-fluorouracil /calcipotriene, and/or imiquimod.  The purpose is to decrease the number of clinically evident and subclinical PreCancerous lesions to prevent progression to development of skin cancer by chemically destroying early precancer changes that may or may not be visible.  It has been shown to reduce the risk of developing skin cancer in the treated  area. As  a result of treatment, redness, scaling, crusting, and open sores may occur during treatment course. One or more than one of these methods may be used and may have to be used several times to control, suppress and eliminate the PreCancerous changes. Discussed treatment course, expected reaction, and possible side effects. - Recommend daily broad spectrum sunscreen SPF 30+ to sun-exposed areas, reapply every 2 hours as needed.  - Staying in the shade or wearing long sleeves, sun glasses (UVA+UVB protection) and wide brim hats (4-inch brim around the entire circumference of the hat) are also recommended. - Call for new or changing lesions. Actinic keratoses are precancerous spots that appear secondary to cumulative UV radiation exposure/sun exposure over time. They are chronic with expected duration over 1 year. A portion of actinic keratoses will progress to squamous cell carcinoma of the skin. It is not possible to reliably predict which spots will progress to skin cancer and so treatment is recommended to prevent development of skin cancer.  Starting after July 4th, 2025 - Start 5-fluorouracil /calcipotriene cream twice a day for 7 days to affected areas including left temple. Prescription sent to [facility]. Patient advised they will receive an email to purchase the medication online and have it sent to their home. Patient provided with handout reviewing treatment course and side effects and advised to call or message us  on MyChart with any concerns. Reviewed course of treatment and expected reaction.  Patient advised to expect inflammation and crusting and advised that erosions are possible.  Patient advised to be diligent with sun protection during and after treatment. Counseled to keep medication out of reach of children and pets.  Recommend daily broad spectrum sunscreen SPF 30+ to sun-exposed areas, reapply every 2 hours as needed.  Recommend staying in the shade or wearing  long sleeves, sun glasses (UVA+UVB protection) and wide brim hats (4-inch brim around the entire circumference of the hat). Call for new or changing lesions. Destruction of lesion - Left Lateral Forehead x1, scalp x1 (2) Complexity: simple   Destruction method: cryotherapy   Informed consent: discussed and consent obtained   Timeout:  patient name, date of birth, surgical site, and procedure verified Lesion destroyed using liquid nitrogen: Yes   Region frozen until ice ball extended beyond lesion: Yes   Outcome: patient tolerated procedure well with no complications   Post-procedure details: wound care instructions given   Additional details:  Prior to procedure, discussed risks of blister formation, small wound, skin dyspigmentation, or rare scar following cryotherapy. Recommend Vaseline ointment to treated areas while healing.  INFLAMED SEBORRHEIC KERATOSIS (4) Left Infra-pectoral x3, R back x1 (4) Symptomatic, irritating, patient would like treated. Destruction of lesion - Left Infra-pectoral x3, R back x1 (4) Complexity: simple   Destruction method: cryotherapy   Informed consent: discussed and consent obtained   Timeout:  patient name, date of birth, surgical site, and procedure verified Lesion destroyed using liquid nitrogen: Yes   Region frozen until ice ball extended beyond lesion: Yes   Outcome: patient tolerated procedure well with no complications   Post-procedure details: wound care instructions given   Additional details:  Prior to procedure, discussed risks of blister formation, small wound, skin dyspigmentation, or rare scar following cryotherapy. Recommend Vaseline ointment to treated areas while healing.    Return in about 9 months (around 05/22/2024) for TBSE, HxMM, HxBCC.  I, Jill Parcell, CMA, am acting as scribe for Celine Collard, MD.   Documentation: I have reviewed the  above documentation for accuracy and completeness, and I agree with the above.  Celine Collard, MD

## 2023-08-21 ENCOUNTER — Encounter: Payer: Self-pay | Admitting: Dermatology

## 2023-09-06 ENCOUNTER — Other Ambulatory Visit: Payer: Self-pay | Admitting: Surgery

## 2023-09-14 ENCOUNTER — Encounter: Payer: Self-pay | Admitting: Internal Medicine

## 2023-09-14 ENCOUNTER — Inpatient Hospital Stay
Admission: EM | Admit: 2023-09-14 | Discharge: 2023-09-17 | DRG: 190 | Disposition: A | Discharge status: Home / Self Care | Attending: Internal Medicine | Admitting: Internal Medicine

## 2023-09-14 ENCOUNTER — Other Ambulatory Visit: Payer: Self-pay

## 2023-09-14 ENCOUNTER — Encounter
Admission: RE | Admit: 2023-09-14 | Discharge: 2023-09-14 | Disposition: A | Discharge status: Home / Self Care | Source: Ambulatory Visit | Attending: Surgery | Admitting: Surgery

## 2023-09-14 ENCOUNTER — Encounter: Payer: Self-pay | Admitting: Surgery

## 2023-09-14 ENCOUNTER — Encounter: Payer: Self-pay | Admitting: Urgent Care

## 2023-09-14 ENCOUNTER — Emergency Department

## 2023-09-14 VITALS — BP 163/77 | HR 111 | Resp 16 | Ht 73.0 in | Wt 250.7 lb

## 2023-09-14 DIAGNOSIS — Z7982 Long term (current) use of aspirin: Secondary | ICD-10-CM

## 2023-09-14 DIAGNOSIS — Z01818 Encounter for other preprocedural examination: Secondary | ICD-10-CM | POA: Insufficient documentation

## 2023-09-14 DIAGNOSIS — N401 Enlarged prostate with lower urinary tract symptoms: Secondary | ICD-10-CM | POA: Diagnosis not present

## 2023-09-14 DIAGNOSIS — M1990 Unspecified osteoarthritis, unspecified site: Secondary | ICD-10-CM | POA: Diagnosis present

## 2023-09-14 DIAGNOSIS — Z1152 Encounter for screening for COVID-19: Secondary | ICD-10-CM

## 2023-09-14 DIAGNOSIS — Z7951 Long term (current) use of inhaled steroids: Secondary | ICD-10-CM

## 2023-09-14 DIAGNOSIS — J189 Pneumonia, unspecified organism: Secondary | ICD-10-CM | POA: Diagnosis present

## 2023-09-14 DIAGNOSIS — J9601 Acute respiratory failure with hypoxia: Secondary | ICD-10-CM | POA: Diagnosis present

## 2023-09-14 DIAGNOSIS — E66811 Obesity, class 1: Secondary | ICD-10-CM | POA: Diagnosis present

## 2023-09-14 DIAGNOSIS — E785 Hyperlipidemia, unspecified: Secondary | ICD-10-CM | POA: Diagnosis present

## 2023-09-14 DIAGNOSIS — E119 Type 2 diabetes mellitus without complications: Secondary | ICD-10-CM | POA: Insufficient documentation

## 2023-09-14 DIAGNOSIS — N400 Enlarged prostate without lower urinary tract symptoms: Secondary | ICD-10-CM | POA: Diagnosis present

## 2023-09-14 DIAGNOSIS — Z85820 Personal history of malignant melanoma of skin: Secondary | ICD-10-CM | POA: Diagnosis not present

## 2023-09-14 DIAGNOSIS — G4733 Obstructive sleep apnea (adult) (pediatric): Secondary | ICD-10-CM | POA: Diagnosis present

## 2023-09-14 DIAGNOSIS — Z7952 Long term (current) use of systemic steroids: Secondary | ICD-10-CM

## 2023-09-14 DIAGNOSIS — E1169 Type 2 diabetes mellitus with other specified complication: Secondary | ICD-10-CM

## 2023-09-14 DIAGNOSIS — Z8249 Family history of ischemic heart disease and other diseases of the circulatory system: Secondary | ICD-10-CM | POA: Diagnosis not present

## 2023-09-14 DIAGNOSIS — I10 Essential (primary) hypertension: Secondary | ICD-10-CM | POA: Diagnosis present

## 2023-09-14 DIAGNOSIS — E669 Obesity, unspecified: Secondary | ICD-10-CM | POA: Diagnosis present

## 2023-09-14 DIAGNOSIS — J441 Chronic obstructive pulmonary disease with (acute) exacerbation: Principal | ICD-10-CM | POA: Diagnosis present

## 2023-09-14 DIAGNOSIS — Z87442 Personal history of urinary calculi: Secondary | ICD-10-CM

## 2023-09-14 DIAGNOSIS — Z6832 Body mass index (BMI) 32.0-32.9, adult: Secondary | ICD-10-CM | POA: Diagnosis not present

## 2023-09-14 DIAGNOSIS — Z87891 Personal history of nicotine dependence: Secondary | ICD-10-CM

## 2023-09-14 DIAGNOSIS — Z8571 Personal history of Hodgkin lymphoma: Secondary | ICD-10-CM

## 2023-09-14 DIAGNOSIS — E1151 Type 2 diabetes mellitus with diabetic peripheral angiopathy without gangrene: Secondary | ICD-10-CM | POA: Diagnosis present

## 2023-09-14 DIAGNOSIS — Z7984 Long term (current) use of oral hypoglycemic drugs: Secondary | ICD-10-CM | POA: Diagnosis not present

## 2023-09-14 DIAGNOSIS — Z9221 Personal history of antineoplastic chemotherapy: Secondary | ICD-10-CM

## 2023-09-14 DIAGNOSIS — Z9079 Acquired absence of other genital organ(s): Secondary | ICD-10-CM | POA: Diagnosis not present

## 2023-09-14 DIAGNOSIS — Z91030 Bee allergy status: Secondary | ICD-10-CM

## 2023-09-14 DIAGNOSIS — Z96652 Presence of left artificial knee joint: Secondary | ICD-10-CM | POA: Diagnosis present

## 2023-09-14 DIAGNOSIS — G629 Polyneuropathy, unspecified: Secondary | ICD-10-CM | POA: Diagnosis present

## 2023-09-14 DIAGNOSIS — Z96642 Presence of left artificial hip joint: Secondary | ICD-10-CM | POA: Diagnosis present

## 2023-09-14 DIAGNOSIS — Z825 Family history of asthma and other chronic lower respiratory diseases: Secondary | ICD-10-CM

## 2023-09-14 DIAGNOSIS — Z85828 Personal history of other malignant neoplasm of skin: Secondary | ICD-10-CM | POA: Diagnosis not present

## 2023-09-14 DIAGNOSIS — J440 Chronic obstructive pulmonary disease with acute lower respiratory infection: Secondary | ICD-10-CM | POA: Diagnosis present

## 2023-09-14 DIAGNOSIS — R051 Acute cough: Principal | ICD-10-CM

## 2023-09-14 DIAGNOSIS — Z881 Allergy status to other antibiotic agents status: Secondary | ICD-10-CM

## 2023-09-14 DIAGNOSIS — Z79899 Other long term (current) drug therapy: Secondary | ICD-10-CM

## 2023-09-14 DIAGNOSIS — R29810 Facial weakness: Secondary | ICD-10-CM | POA: Diagnosis present

## 2023-09-14 HISTORY — DX: Obesity, unspecified: E66.9

## 2023-09-14 LAB — CBC WITH DIFFERENTIAL/PLATELET
Abs Immature Granulocytes: 0.03 10*3/uL (ref 0.00–0.07)
Basophils Absolute: 0 10*3/uL (ref 0.0–0.1)
Basophils Relative: 0 %
Eosinophils Absolute: 0 10*3/uL (ref 0.0–0.5)
Eosinophils Relative: 0 %
HCT: 35 % — ABNORMAL LOW (ref 39.0–52.0)
Hemoglobin: 11.3 g/dL — ABNORMAL LOW (ref 13.0–17.0)
Immature Granulocytes: 1 %
Lymphocytes Relative: 7 %
Lymphs Abs: 0.4 10*3/uL — ABNORMAL LOW (ref 0.7–4.0)
MCH: 27.6 pg (ref 26.0–34.0)
MCHC: 32.3 g/dL (ref 30.0–36.0)
MCV: 85.6 fL (ref 80.0–100.0)
Monocytes Absolute: 0.4 10*3/uL (ref 0.1–1.0)
Monocytes Relative: 8 %
Neutro Abs: 4.6 10*3/uL (ref 1.7–7.7)
Neutrophils Relative %: 84 %
Platelets: 285 10*3/uL (ref 150–400)
RBC: 4.09 MIL/uL — ABNORMAL LOW (ref 4.22–5.81)
RDW: 17.2 % — ABNORMAL HIGH (ref 11.5–15.5)
WBC: 5.4 10*3/uL (ref 4.0–10.5)
nRBC: 0 % (ref 0.0–0.2)

## 2023-09-14 LAB — SARS CORONAVIRUS 2 BY RT PCR: SARS Coronavirus 2 by RT PCR: NEGATIVE

## 2023-09-14 LAB — BASIC METABOLIC PANEL WITH GFR
Anion gap: 12 (ref 5–15)
BUN: 16 mg/dL (ref 8–23)
CO2: 23 mmol/L (ref 22–32)
Calcium: 9.2 mg/dL (ref 8.9–10.3)
Chloride: 105 mmol/L (ref 98–111)
Creatinine, Ser: 1.04 mg/dL (ref 0.61–1.24)
GFR, Estimated: >60 mL/min (ref >60)
Glucose, Bld: 305 mg/dL — ABNORMAL HIGH (ref 70–99)
Potassium: 4.2 mmol/L (ref 3.5–5.1)
Sodium: 140 mmol/L (ref 135–145)

## 2023-09-14 LAB — CBC
HCT: 36.9 % — ABNORMAL LOW (ref 39.0–52.0)
Hemoglobin: 11.8 g/dL — ABNORMAL LOW (ref 13.0–17.0)
MCH: 27.4 pg (ref 26.0–34.0)
MCHC: 32 g/dL (ref 30.0–36.0)
MCV: 85.6 fL (ref 80.0–100.0)
Platelets: 290 10*3/uL (ref 150–400)
RBC: 4.31 MIL/uL (ref 4.22–5.81)
RDW: 17.3 % — ABNORMAL HIGH (ref 11.5–15.5)
WBC: 4.9 10*3/uL (ref 4.0–10.5)
nRBC: 0 % (ref 0.0–0.2)

## 2023-09-14 LAB — COMPREHENSIVE METABOLIC PANEL WITH GFR
ALT: 47 U/L — ABNORMAL HIGH (ref 0–44)
AST: 38 U/L (ref 15–41)
Albumin: 3 g/dL — ABNORMAL LOW (ref 3.5–5.0)
Alkaline Phosphatase: 77 U/L (ref 38–126)
Anion gap: 8 (ref 5–15)
BUN: 17 mg/dL (ref 8–23)
CO2: 23 mmol/L (ref 22–32)
Calcium: 8.7 mg/dL — ABNORMAL LOW (ref 8.9–10.3)
Chloride: 107 mmol/L (ref 98–111)
Creatinine, Ser: 1.04 mg/dL (ref 0.61–1.24)
GFR, Estimated: >60 mL/min (ref >60)
Glucose, Bld: 217 mg/dL — ABNORMAL HIGH (ref 70–99)
Potassium: 3.9 mmol/L (ref 3.5–5.1)
Sodium: 138 mmol/L (ref 135–145)
Total Bilirubin: 0.9 mg/dL (ref 0.0–1.2)
Total Protein: 6.8 g/dL (ref 6.5–8.1)

## 2023-09-14 LAB — TROPONIN I (HIGH SENSITIVITY): Troponin I (High Sensitivity): 17 ng/L (ref <18)

## 2023-09-14 LAB — RESP PANEL BY RT-PCR (RSV, FLU A&B, COVID)  RVPGX2
Influenza A by PCR: NEGATIVE
Influenza B by PCR: NEGATIVE
Resp Syncytial Virus by PCR: NEGATIVE
SARS Coronavirus 2 by RT PCR: NEGATIVE

## 2023-09-14 MED ORDER — ONDANSETRON HCL 4 MG/2ML IJ SOLN
4.0000 mg | Freq: Three times a day (TID) | INTRAMUSCULAR | Status: DC | PRN
  Administered 2023-09-16: 4 mg via INTRAVENOUS
  Filled 2023-09-14: qty 2

## 2023-09-14 MED ORDER — METHYLPREDNISOLONE SODIUM SUCC 125 MG IJ SOLR
80.0000 mg | INTRAMUSCULAR | Status: DC
  Administered 2023-09-15 – 2023-09-17 (×3): 80 mg via INTRAVENOUS
  Filled 2023-09-14 (×3): qty 2

## 2023-09-14 MED ORDER — DUTASTERIDE 0.5 MG PO CAPS
0.5000 mg | ORAL_CAPSULE | Freq: Every day | ORAL | Status: DC
  Administered 2023-09-15 – 2023-09-17 (×3): 0.5 mg via ORAL
  Filled 2023-09-14 (×3): qty 1

## 2023-09-14 MED ORDER — IPRATROPIUM-ALBUTEROL 0.5-2.5 (3) MG/3ML IN SOLN
3.0000 mL | RESPIRATORY_TRACT | Status: DC
  Administered 2023-09-15 – 2023-09-16 (×8): 3 mL via RESPIRATORY_TRACT
  Filled 2023-09-14 (×8): qty 3

## 2023-09-14 MED ORDER — OXYCODONE-ACETAMINOPHEN 5-325 MG PO TABS
1.0000 | ORAL_TABLET | ORAL | Status: DC | PRN
  Administered 2023-09-16: 1 via ORAL
  Filled 2023-09-14: qty 1

## 2023-09-14 MED ORDER — ALBUTEROL SULFATE (2.5 MG/3ML) 0.083% IN NEBU: 2.5000 mg | INHALATION_SOLUTION | RESPIRATORY_TRACT | Status: DC | PRN

## 2023-09-14 MED ORDER — HYDRALAZINE HCL 20 MG/ML IJ SOLN: 5.0000 mg | INTRAMUSCULAR | Status: DC | PRN

## 2023-09-14 MED ORDER — METOPROLOL SUCCINATE ER 50 MG PO TB24
50.0000 mg | ORAL_TABLET | Freq: Every day | ORAL | Status: DC
  Administered 2023-09-15 – 2023-09-17 (×3): 50 mg via ORAL
  Filled 2023-09-14 (×3): qty 1

## 2023-09-14 MED ORDER — LEVOFLOXACIN IN D5W 500 MG/100ML IV SOLN
500.0000 mg | INTRAVENOUS | Status: DC
  Administered 2023-09-15: 500 mg via INTRAVENOUS
  Filled 2023-09-14: qty 100

## 2023-09-14 MED ORDER — ALBUTEROL SULFATE HFA 108 (90 BASE) MCG/ACT IN AERS: 2.0000 | INHALATION_SPRAY | RESPIRATORY_TRACT | Status: DC | PRN

## 2023-09-14 MED ORDER — ENOXAPARIN SODIUM 60 MG/0.6ML IJ SOSY
55.0000 mg | PREFILLED_SYRINGE | INTRAMUSCULAR | Status: DC
  Administered 2023-09-15 – 2023-09-17 (×3): 55 mg via SUBCUTANEOUS
  Filled 2023-09-14 (×3): qty 0.6

## 2023-09-14 MED ORDER — INSULIN ASPART 100 UNIT/ML IJ SOLN
0.0000 [IU] | Freq: Every day | INTRAMUSCULAR | Status: DC
  Administered 2023-09-15: 3 [IU] via SUBCUTANEOUS
  Administered 2023-09-16: 2 [IU] via SUBCUTANEOUS
  Filled 2023-09-14 (×2): qty 1

## 2023-09-14 MED ORDER — ALFUZOSIN HCL ER 10 MG PO TB24
10.0000 mg | ORAL_TABLET | Freq: Every day | ORAL | Status: DC
  Administered 2023-09-15 – 2023-09-17 (×3): 10 mg via ORAL
  Filled 2023-09-14 (×3): qty 1

## 2023-09-14 MED ORDER — IPRATROPIUM-ALBUTEROL 0.5-2.5 (3) MG/3ML IN SOLN: 3.0000 mL | RESPIRATORY_TRACT | Status: DC | PRN

## 2023-09-14 MED ORDER — SODIUM CHLORIDE 0.9 % IV BOLUS
1000.0000 mL | Freq: Once | INTRAVENOUS | Status: AC
  Administered 2023-09-15: 1000 mL via INTRAVENOUS

## 2023-09-14 MED ORDER — MONTELUKAST SODIUM 10 MG PO TABS
10.0000 mg | ORAL_TABLET | Freq: Every day | ORAL | Status: DC
  Administered 2023-09-15 – 2023-09-16 (×2): 10 mg via ORAL
  Filled 2023-09-14 (×2): qty 1

## 2023-09-14 MED ORDER — PREGABALIN 50 MG PO CAPS: 50.0000 mg | ORAL_CAPSULE | ORAL | Status: DC

## 2023-09-14 MED ORDER — METHYLPREDNISOLONE SODIUM SUCC 125 MG IJ SOLR
62.5000 mg | Freq: Once | INTRAMUSCULAR | Status: AC
  Administered 2023-09-14: 62.5 mg via INTRAVENOUS
  Filled 2023-09-14: qty 2

## 2023-09-14 MED ORDER — DM-GUAIFENESIN ER 30-600 MG PO TB12
1.0000 | ORAL_TABLET | Freq: Two times a day (BID) | ORAL | Status: DC | PRN
  Administered 2023-09-15 – 2023-09-16 (×2): 1 via ORAL
  Filled 2023-09-14 (×3): qty 1

## 2023-09-14 MED ORDER — ALBUTEROL SULFATE (2.5 MG/3ML) 0.083% IN NEBU
2.5000 mg | INHALATION_SOLUTION | Freq: Once | RESPIRATORY_TRACT | Status: AC
Start: 2023-09-14 — End: 2023-09-14
  Administered 2023-09-14: 2.5 mg via RESPIRATORY_TRACT
  Filled 2023-09-14: qty 3

## 2023-09-14 MED ORDER — INSULIN ASPART 100 UNIT/ML IJ SOLN
0.0000 [IU] | Freq: Three times a day (TID) | INTRAMUSCULAR | Status: DC
  Administered 2023-09-15 (×2): 5 [IU] via SUBCUTANEOUS
  Administered 2023-09-15: 9 [IU] via SUBCUTANEOUS
  Administered 2023-09-16: 7 [IU] via SUBCUTANEOUS
  Administered 2023-09-16: 2 [IU] via SUBCUTANEOUS
  Administered 2023-09-17: 5 [IU] via SUBCUTANEOUS
  Administered 2023-09-17: 2 [IU] via SUBCUTANEOUS
  Filled 2023-09-14 (×7): qty 1

## 2023-09-14 MED ORDER — GABAPENTIN 300 MG PO CAPS
300.0000 mg | ORAL_CAPSULE | Freq: Two times a day (BID) | ORAL | Status: DC
  Administered 2023-09-15 – 2023-09-17 (×5): 300 mg via ORAL
  Filled 2023-09-14 (×5): qty 1

## 2023-09-14 MED ORDER — LEVOFLOXACIN IN D5W 500 MG/100ML IV SOLN
500.0000 mg | Freq: Once | INTRAVENOUS | Status: AC
  Administered 2023-09-14: 500 mg via INTRAVENOUS
  Filled 2023-09-14: qty 100

## 2023-09-14 MED ORDER — RAMIPRIL 10 MG PO CAPS: 20.0000 mg | ORAL_CAPSULE | Freq: Every day | ORAL | Status: DC

## 2023-09-14 MED ORDER — ROSUVASTATIN CALCIUM 20 MG PO TABS
40.0000 mg | ORAL_TABLET | Freq: Every day | ORAL | Status: DC
  Administered 2023-09-15 – 2023-09-16 (×2): 40 mg via ORAL
  Filled 2023-09-14 (×4): qty 2

## 2023-09-14 MED ORDER — ACETAMINOPHEN 325 MG PO TABS: 650.0000 mg | ORAL_TABLET | Freq: Four times a day (QID) | ORAL | Status: DC | PRN

## 2023-09-14 NOTE — ED Provider Notes (Signed)
 [facility] Provider Note    Event Date/Time   First MD Initiated Contact with Patient 09/14/23 2101     (approximate)   History   Cough   HPI  Austin Sullivan is a 68 y.o. male presenting to the emergency department with cough x 6 days that was initially productive but has been nonproductive the last 24 hours.  Patient also endorses difficulty breathing and chest pain.  Patient states he was seen and diagnosed with sinusitis last week, given prednisone and cough Perles, but these helped minimally. Denies nausea, vomiting, diarrhea, abdominal pain.   Patient does report fever yesterday of 100-101 degrees Fahrenheit. He did use an albuterol  inhaler this morning which gave him some relief. Did have recent travel of 6-7 hours on a plane this past Sunday. Prior cigarette smoker, quit 30 years ago.  He does not use oxygen at home.   Past medical history includes hypertension, diabetes, COPD, Hodgkin's lymphoma, melanoma, basal cell carcinoma, asthma.    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   Allergies include Ancef , bee venom.  Physical Exam   Triage Vital Signs: ED Triage Vitals [09/14/23 2054]  Encounter Vitals Group     BP (!) 158/69     Girls Systolic BP Percentile      Girls Diastolic BP Percentile      Boys Systolic BP Percentile      Boys Diastolic BP Percentile      Pulse Rate (!) 108     Resp (!) 22     Temp 99 F (37.2 C)     Temp Source Oral     SpO2 94 %     Weight 250 lb (113.4 kg)     Height 6' 1 (1.854 m)     Head Circumference      Peak Flow      Pain Score      Pain Loc      Pain Education      Exclude from Growth Chart     Most recent vital signs: Vitals:   09/14/23 2123 09/14/23 2341  BP: 135/67 124/73  Pulse: 100 (!) 103  Resp: 20 16  Temp: 99 F (37.2 C)   SpO2: 93% 98%     General: Appears stated age.   Minor difficulty breathing at rest while talking. On 2L O2 in room, bumped to 3L due to O2 sats of 88-89%. Head: Normocephalic, atraumatic. Eyes: No scleral icterus or conjunctival injection. Ears/Nose/Throat: TMs intact b/l. Nares patent, no nasal discharge. Oropharynx moist, no erythema or exudate. Dentition intact. CV: Tachycardic, 100 bpm. Peripheral pulses 2+ and symmetric. Cap refill <2 sec. Respiratory: Inspiratory and expiratory rhonchi in bilateral lower lung fields.  Mild respiratory distress at rest.  GI: Soft, non-distended, non-tender. No rebound or guarding.  Skin:Warm, dry, intact. No rashes, lesions, or ecchymosis. No cyanosis or pallor. Neurological: A&Ox4 to person, place, time, and situation.    ED Results / Procedures / Treatments   Labs (all labs ordered are listed, but only abnormal results are displayed) Labs Reviewed  CBC WITH DIFFERENTIAL/PLATELET - Abnormal; Notable for the following components:      Result Value   RBC 4.09 (*)    Hemoglobin 11.3 (*)    HCT 35.0 (*)    RDW 17.2 (*)    Lymphs Abs 0.4 (*)    All other components  within normal limits  COMPREHENSIVE METABOLIC PANEL WITH GFR - Abnormal; Notable for the following components:   Glucose, Bld 217 (*)    Calcium  8.7 (*)    Albumin 3.0 (*)    ALT 47 (*)    All other components within normal limits  SARS CORONAVIRUS 2 BY RT PCR  RESP PANEL BY RT-PCR (RSV, FLU A&B, COVID)  RVPGX2  CULTURE, BLOOD (ROUTINE X 2)  CULTURE, BLOOD (ROUTINE X 2)  EXPECTORATED SPUTUM ASSESSMENT W GRAM STAIN, RFLX TO RESP C  HIV ANTIBODY (ROUTINE TESTING W REFLEX)  STREP PNEUMONIAE URINARY ANTIGEN  BASIC METABOLIC PANEL WITH GFR  CBC  TROPONIN I (HIGH SENSITIVITY)     EKG  Rate: 101 bpm, tachycardic Rhythm: Regularly irregular due to 1 PAC Axis: Positive P Waves: present before every QRS; 1 premature atrial complex seen PR Interval: 184 ms QRS Complex: 90 ms ST Segment: No depression or elevation QT interval:  354 ms T Waves: upright, symmetric No evidence of LVH, STEMI, NSTEMI, LBBB, or RBBB.    RADIOLOGY   CXR ordered.   IMPRESSION: Platelike atelectasis in the left mid lung.   PROCEDURES:  Critical Care performed: No  Procedures   MEDICATIONS ORDERED IN ED: Medications  levofloxacin (LEVAQUIN) IVPB 500 mg (500 mg Intravenous New Bag/Given 09/14/23 2250)  ipratropium-albuterol  (DUONEB) 0.5-2.5 (3) MG/3ML nebulizer solution 3 mL (has no administration in time range)  dextromethorphan-guaiFENesin (MUCINEX DM) 30-600 MG per 12 hr tablet 1 tablet (has no administration in time range)  methylPREDNISolone  sodium succinate (SOLU-MEDROL ) 125 mg/2 mL injection 80 mg (has no administration in time range)  albuterol  (PROVENTIL ) (2.5 MG/3ML) 0.083% nebulizer solution 2.5 mg (has no administration in time range)  ondansetron  (ZOFRAN ) injection 4 mg (has no administration in time range)  hydrALAZINE  (APRESOLINE ) injection 5 mg (has no administration in time range)  acetaminophen  (TYLENOL ) tablet 650 mg (has no administration in time range)  enoxaparin  (LOVENOX ) injection 55 mg (has no administration in time range)  oxyCODONE -acetaminophen  (PERCOCET/ROXICET) 5-325 MG per tablet 1 tablet (has no administration in time range)  metoprolol  succinate (TOPROL -XL) 24 hr tablet 50 mg (has no administration in time range)  ramipril  (ALTACE ) capsule 20 mg (has no administration in time range)  rosuvastatin  (CRESTOR ) tablet 40 mg (has no administration in time range)  alfuzosin  (UROXATRAL ) 24 hr tablet 10 mg (has no administration in time range)  dutasteride  (AVODART ) capsule 0.5 mg (has no administration in time range)  gabapentin  (NEURONTIN ) capsule 300 mg (has no administration in time range)  pregabalin  (LYRICA ) capsule 50 mg (has no administration in time range)  montelukast  (SINGULAIR ) tablet 10 mg (has no administration in time range)  insulin  aspart (novoLOG ) injection 0-9 Units (has no  administration in time range)  insulin  aspart (novoLOG ) injection 0-5 Units (has no administration in time range)  sodium chloride  0.9 % bolus 1,000 mL (has no administration in time range)  albuterol  (PROVENTIL ) (2.5 MG/3ML) 0.083% nebulizer solution 2.5 mg (2.5 mg Nebulization Given 09/14/23 2140)  methylPREDNISolone  sodium succinate (SOLU-MEDROL ) 125 mg/2 mL injection 62.5 mg (62.5 mg Intravenous Given 09/14/23 2246)     IMPRESSION / MDM / ASSESSMENT AND PLAN / ED COURSE  I reviewed the triage vital signs and the nursing notes.                              Differential diagnosis includes, but is not limited to, COPD exacerbation, pneumonia, atelectasis, pulmonary embolism, asthma exacerbation  Patient's presentation is most consistent with acute complicated illness / injury requiring diagnostic workup.  Patient is a 68 year old male presenting with cough x 6 days, initially productive but now nonproductive; dyspnea and chest pain. Patient was placed on 2 L of O2 [place] due to oxygen saturation of 89% on room air at arrival in triage, then increased to 3 L O2 by nasal cannula due to oxygen decreasing 88 to 89% when talking.  Patient given 1 albuterol  nebulizer, this helped some. CBC, CMP, respiratory panel, chest x-ray, and EKG ordered in emergency department.  Patient is tachycardic with pulse ranging from 100-108 bpm, tachypneic at 22 RR. EKG shows 1 premature atrial complex with sinus tachycardia. Respiratory panel is negative for RSV, influenza AMB, and COVID-19.  CMP with calcium  8.7, albumin 3.0, ALT 47; eGFR is greater than 60 and creatinine is 1.04.  CBC with white blood cell count of 5.4, hemoglobin 11.3.    I had some concerns regarding a pulmonary embolism in this patient due to his tachycardia, tachypnea, history of smoking and cancer, and recent travel and questioned if there is a need for a CTA. Wells Criteria shows 4.5 points, moderate risk group. Discussed the clinical picture with Dr.  Ruth Cove, my supervising physician, and he feels a CTA is not necessary and that this could be more of a COPD exacerbation or pneumonia due to increased sputum production, rhonchi on exam, and wife having congestion symptoms as well in the patient's room.  We reviewed patient's chest x-ray together which showed platelike atelectasis in the left mid lung lobe, and compared it to his chest x-ray done on July 26, 2020 which shows similar atelectasis in the same region.    Due to the patient's increasing oxygen demands at rest, I feel it is necessary that he is admitted to the hospital for observation and to receive IV antibiotics for potential early pneumonia.  They can also determine if more advanced imaging such as CT of the chest or CTA is warranted if he is not improving with the proposed treatment. Patient was started on Levaquin, DuoNebs, and given Solu-Medrol  62.5 mg IV. I consulted hospitalist team who will evaluate him for admission and take over his care from here.   FINAL CLINICAL IMPRESSION(S) / ED DIAGNOSES   Final diagnoses:  Acute cough     Rx / DC Orders   ED Discharge Orders     None        Note:  This document was prepared using Dragon voice recognition software and may include unintentional dictation errors.    Thomasenia Flesher, PA-C 09/14/23 2344    Ruth Cove, MD 09/15/23 1550

## 2023-09-14 NOTE — Patient Instructions (Addendum)
 Your procedure is scheduled on: Thursday 09/20/23  Report to the Registration Desk on the 1st floor of the [facility]. To find out your arrival time, please call [phone] between 1PM - 3PM on: Wednesday 09/19/23 If your arrival time is 6:00 am, do not arrive before that time as the [facility] entrance doors do not open until 6:00 am.  REMEMBER: Instructions that are not followed completely may result in serious medical risk, up to and including death; or upon the discretion of your surgeon and anesthesiologist your surgery may need to be rescheduled.  Do not eat food after midnight the night before surgery.  No gum chewing or hard candies.  You may however, drink CLEAR liquids up to 2 hours before you are scheduled to arrive for your surgery. Do not drink anything within 2 hours of your scheduled arrival time.  Clear liquids include: - water  Do NOT drink anything that is not on this list.  **Type 1 and Type 2 diabetics should only drink water.**  In addition, your doctor has ordered for you to drink the provided:  Gatorade G2 Drinking this carbohydrate drink up to two hours before surgery helps to reduce insulin  resistance and improve patient outcomes. Please complete drinking 2 hours before scheduled arrival time.  One week prior to surgery: Stop Anti-inflammatories (NSAIDS) such as Advil, Aleve, Ibuprofen, Motrin, Naproxen, Naprosyn and  based products such as Excedrin, Goody's Powder, BC Powder. Stop ANY OVER THE COUNTER supplements until after surgery.  Stop metFORMIN  (GLUCOPHAGE ) 1000 MG 2 days prior to surgery (take last dose Monday 09/17/23)  You may however, continue to take Tylenol  if needed for pain up until the day of surgery.  Continue taking all of your other prescription medications up until the day of surgery.  ON THE DAY OF SURGERY ONLY TAKE THESE MEDICATIONS WITH SIPS OF WATER:  alfuzosin  (UROXATRAL ) 10 MG 24 hr  dutasteride  (AVODART ) 0.5 mg gabapentin   (NEURONTIN ) 300 MG  metoprolol  succinate (TOPROL -XL) 50 MG 24 hr   Use inhalers on the day of surgery and bring to the hospital. albuterol  (VENTOLIN  HFA) 108 (90 Base) MCG/ACT inhaler  fluticasone  furoate-vilanterol (BREO ELLIPTA ) 100-25 MCG/INH AEPB   No Alcohol for 24 hours before or after surgery.  No Smoking including e-cigarettes for 24 hours before surgery.  No chewable tobacco products for at least 6 hours before surgery.  No nicotine patches on the day of surgery.  Do not use any recreational drugs for at least a week (preferably 2 weeks) before your surgery.  Please be advised that the combination of cocaine and anesthesia may have negative outcomes, up to and including death. If you test positive for cocaine, your surgery will be cancelled.  On the morning of surgery brush your teeth with toothpaste and water, you may rinse your mouth with mouthwash if you wish. Do not swallow any toothpaste or mouthwash.  Use CHG Soap or wipes as directed on instruction sheet.  Do not wear jewelry, make-up, hairpins, clips or nail polish.  For welded (permanent) jewelry: bracelets, anklets, waist bands, etc.  Please have this removed prior to surgery.  If it is not removed, there is a chance that hospital personnel will need to cut it off on the day of surgery.  Do not wear lotions, powders, or perfumes.   Do not shave body hair from the neck down 48 hours before surgery.  Contact lenses, hearing aids and dentures may not be worn into surgery.  Do not bring  valuables to the hospital. [facility] is not responsible for any missing/lost belongings or valuables.   Notify your doctor if there is any change in your medical condition (cold, fever, infection).  Wear comfortable clothing (specific to your surgery type) to the hospital.  After surgery, you can help prevent lung complications by doing breathing exercises.  Take deep breaths and cough every 1-2 hours. Your doctor may order a  device called an Incentive Spirometer to help you take deep breaths. When coughing or sneezing, hold a pillow firmly against your incision with both hands. This is called "splinting." Doing this helps protect your incision. It also decreases belly discomfort.  If you are being admitted to the hospital overnight, leave your suitcase in the car. After surgery it may be brought to your room.  In case of increased patient census, it may be necessary for you, the patient, to continue your postoperative care in the Same Day Surgery department.  If you are being discharged the day of surgery, you will not be allowed to drive home. You will need a responsible individual to drive you home and stay with you for 24 hours after surgery.   If you are taking public transportation, you will need to have a responsible individual with you.  Please call the Pre-admissions Testing Dept. at [phone] if you have any questions about these instructions.  Surgery Visitation Policy:  Patients having surgery or a procedure may have two visitors.  Children under the age of 61 must have an adult with them who is not the patient.  Inpatient Visitation:    Visiting hours are 7 a.m. to 8 p.m. Up to four visitors are allowed at one time in a patient room. The visitors may rotate out with other people during the day. One visitor age 84 or older may stay with the patient overnight and must be in the room by 8 p.m.      Preparing for Surgery with CHLORHEXIDINE  GLUCONATE (CHG) Soap  Chlorhexidine  Gluconate (CHG) Soap  o An antiseptic cleaner that kills germs and bonds with the skin to continue killing germs even after washing  o Used for showering the night before surgery and morning of surgery  Before surgery, you can play an important role by reducing the number of germs on your skin.  CHG (Chlorhexidine  gluconate) soap is an antiseptic cleanser which kills germs and bonds with the skin to continue killing  germs even after washing.  Please do not use if you have an allergy to CHG or antibacterial soaps. If your skin becomes reddened/irritated stop using the CHG.  1. Shower the NIGHT BEFORE SURGERY and the MORNING OF SURGERY with CHG soap.  2. If you choose to wash your hair, wash your hair first as usual with your normal shampoo.  3. After shampooing, rinse your hair and body thoroughly to remove the shampoo.  4. Use CHG as you would any other liquid soap. You can apply CHG directly to the skin and wash gently with a scrungie or a clean washcloth.  5. Apply the CHG soap to your body only from the neck down. Do not use on open wounds or open sores. Avoid contact with your eyes, ears, mouth, and genitals (private parts). Wash face and genitals (private parts) with your normal soap.  6. Wash thoroughly, paying special attention to the area where your surgery will be performed.  7. Thoroughly rinse your body with warm water.  8. Do not shower/wash with your normal  soap after using and rinsing off the CHG soap.  9. Pat yourself dry with a clean towel.  10. Wear clean pajamas to bed the night before surgery.  12. Place clean sheets on your bed the night of your first shower and do not sleep with pets.  13. Shower again with the CHG soap on the day of surgery prior to arriving at the hospital.  14. Do not apply any deodorants/lotions/powders.  15. Please wear clean clothes to the hospital.

## 2023-09-14 NOTE — ED Provider Notes (Signed)
-----------------------------------------   10:36 PM on 09/14/2023 ----------------------------------------- I have personally seen and evaluated the patient in conjunction with the physician assistant.  Patient has a worsening cough and congestion over the past few days after a flight on Sunday.  Patient's wife also has a cough.  Patient has a history of COPD does not wear oxygen at baseline.  Highly suspect the patient may have acquired an upper respiratory infection and now is suffering from a COPD exacerbation.  Given the patient's reported fever of 100-101 at home along with worsening cough and rhonchi on examination we will cover with antibiotics as a precaution.  Patient has an Ancef  allergy we will cover with Levaquin and start the patient on Solu-Medrol  (half dose as the patient took oral prednisone this morning) and DuoNebs.  Given the patient's hypoxia down to 88% on room air with no baseline O2 requirement will admit to the hospital service for further workup and treatment.  Reassuringly patient CBC is normal chemistry is normal.  COVID is negative.  I have reviewed and interpreted the chest x-ray images.  No obvious consolidation on my evaluation although there is a bandlike atelectasis in the left midlung.  That appears to be somewhat present in 2022.     Ruth Cove, MD 09/14/23 2244

## 2023-09-14 NOTE — ED Triage Notes (Signed)
 Ambulatory to triage  Per pt I've been coughing like crazy since Sunday.  Per wife it reminds me of when they found cancer  Dyspnea on exertion noted, oxygen 89% RA initially. O2 up to 93% after sitting down.
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
## 2023-09-14 NOTE — Progress Notes (Signed)
 PHARMACIST - PHYSICIAN COMMUNICATION  CONCERNING:  Enoxaparin  (Lovenox ) for DVT Prophylaxis    RECOMMENDATION: Patient was prescribed enoxaprin 40mg  q24 hours for VTE prophylaxis.   Filed Weights   09/14/23 2054  Weight: 113.4 kg (250 lb)    Body mass index is 32.98 kg/m.  Estimated Creatinine Clearance: 87.2 mL/min (by C-G formula based on SCr of 1.04 mg/dL).   Based on [facility] policy patient is candidate for enoxaparin  0.5mg /kg TBW SQ every 24 hours based on BMI being >30.  DESCRIPTION: Pharmacy has adjusted enoxaparin  dose per [facility] policy.  Patient is now receiving enoxaparin  0.5 mg/kg every 24 hours   Coretta Dexter, PharmD, [facility] 09/14/2023 11:06 PM

## 2023-09-14 NOTE — Progress Notes (Signed)
  Perioperative Services Pre-Admission/Anesthesia Testing   Date: 09/14/23 Name: Austin Sullivan MRN:   [id number]  Re: Consideration of preoperative prophylactic antibiotic change   Request sent to: Poggi, Kaylene Pascal, MD (routed and/or faxed via [facility])  Planned Surgical Procedure(s):    Case: [id number] Date/Time: 09/20/23 1304   Procedures:      ARTHROSCOPY, SHOULDER WITH DEBRIDEMENT (Right: Shoulder)     DECOMPRESSION, SUBACROMIAL SPACE (Right: Shoulder)     REPAIR, ROTATOR CUFF, OPEN (Right: Shoulder)     TENODESIS, BICEPS (Right: Shoulder)   Anesthesia type: Choice   Diagnosis:      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   EMR review indicated that patient received PCN and/or cephalosporin in the past, however patient required premedication with diphenhydramine  and solu-medrol .   In review of the preoperative orders, it was noted in PAT that patient had orders for both CEFAZOLIN  and LEVOFLOXACIN.  Reached out to primary attending surgeon to discuss.    While patient has received cephalosporins in the past, he has required premedication with antihistamine and corticosteroids.  In efforts to avoid any potential complications, surgeon wishes to defer cephalosporin use and proceed with ordered LEVOFLOXACIN 500 mg IV.  Order for CEFAZOLIN  will be discontinued by PAT APP.  No other needs from the PAT department at this time.  Renate Caroline, MSN, APRN, FNP-C, CEN [facility]  Perioperative Services Nurse Practitioner Phone: [phone] Fax: [phone] 09/14/23 3:13 PM  NOTE: This note has been prepared using Dragon dictation software. Despite my best ability to proofread, there is always the potential that unintentional transcriptional errors may still occur from this process.

## 2023-09-14 NOTE — ED Notes (Signed)
 Patient placed on 2L nasal cannula due to sats around 88% on room air.

## 2023-09-15 ENCOUNTER — Encounter: Payer: Self-pay | Admitting: Internal Medicine

## 2023-09-15 DIAGNOSIS — J441 Chronic obstructive pulmonary disease with (acute) exacerbation: Secondary | ICD-10-CM | POA: Diagnosis not present

## 2023-09-15 LAB — STREP PNEUMONIAE URINARY ANTIGEN: Strep Pneumo Urinary Antigen: NEGATIVE

## 2023-09-15 LAB — CBC
HCT: 34.5 % — ABNORMAL LOW (ref 39.0–52.0)
Hemoglobin: 11 g/dL — ABNORMAL LOW (ref 13.0–17.0)
MCH: 27.4 pg (ref 26.0–34.0)
MCHC: 31.9 g/dL (ref 30.0–36.0)
MCV: 86 fL (ref 80.0–100.0)
Platelets: 287 10*3/uL (ref 150–400)
RBC: 4.01 MIL/uL — ABNORMAL LOW (ref 4.22–5.81)
RDW: 17.2 % — ABNORMAL HIGH (ref 11.5–15.5)
WBC: 5.3 10*3/uL (ref 4.0–10.5)
nRBC: 0 % (ref 0.0–0.2)

## 2023-09-15 LAB — BASIC METABOLIC PANEL WITH GFR
Anion gap: 11 (ref 5–15)
BUN: 17 mg/dL (ref 8–23)
CO2: 21 mmol/L — ABNORMAL LOW (ref 22–32)
Calcium: 8.5 mg/dL — ABNORMAL LOW (ref 8.9–10.3)
Chloride: 108 mmol/L (ref 98–111)
Creatinine, Ser: 0.93 mg/dL (ref 0.61–1.24)
GFR, Estimated: >60 mL/min (ref >60)
Glucose, Bld: 191 mg/dL — ABNORMAL HIGH (ref 70–99)
Potassium: 4 mmol/L (ref 3.5–5.1)
Sodium: 140 mmol/L (ref 135–145)

## 2023-09-15 LAB — PROCALCITONIN: Procalcitonin: 0.54 ng/mL

## 2023-09-15 LAB — HIV ANTIBODY (ROUTINE TESTING W REFLEX): HIV Screen 4th Generation wRfx: NONREACTIVE

## 2023-09-15 MED ORDER — IRBESARTAN 150 MG PO TABS
300.0000 mg | ORAL_TABLET | Freq: Every day | ORAL | Status: DC
  Administered 2023-09-16 – 2023-09-17 (×2): 300 mg via ORAL
  Filled 2023-09-15 (×3): qty 2

## 2023-09-15 MED ORDER — PREGABALIN 50 MG PO CAPS
50.0000 mg | ORAL_CAPSULE | Freq: Three times a day (TID) | ORAL | Status: DC
  Administered 2023-09-15 – 2023-09-17 (×6): 50 mg via ORAL
  Filled 2023-09-15 (×7): qty 1

## 2023-09-15 MED ORDER — PREGABALIN 50 MG PO CAPS
100.0000 mg | ORAL_CAPSULE | Freq: Every day | ORAL | Status: DC
  Administered 2023-09-15 – 2023-09-16 (×2): 100 mg via ORAL
  Filled 2023-09-15 (×2): qty 2

## 2023-09-15 NOTE — Progress Notes (Signed)
 PROGRESS NOTE    Austin Sullivan  MVH:[id number] DOB: [birth year] DOA: 09/14/2023 PCP: Nestor Banter, MD    Assessment & Plan:   Principal Problem:   COPD exacerbation (HCC) Active Problems:   Essential hypertension   HLD (hyperlipidemia)   Diabetes mellitus without complication (HCC)   BPH (benign prostatic hyperplasia)   OSA (obstructive sleep apnea)   Obesity (BMI 30-39.9)  Assessment and Plan: COPD exacerbation: fever at 2 days ago as per pt, cannot completely rule out early stage of pneumonia. Continue on IV levaquin, steroids, bronchodilators & encourage incentive spirometry. Procal 0.54. Strep was neg   HTN: continue on metoprolol , irbesartan (substitute for telmisartan)  HLD: continue on statin    DM2: well controlled, HbA1c 6.4. Continue on SSI w/ accuchecks    BPH: continue on home dose of alfuzosin , dutasteride   OSA: CPAP qhs   Obesity: BMI 32.9. Would benefit from weight loss        DVT prophylaxis: lovenox   Code Status: full  Family Communication: Disposition Plan: likely d/c back home   Level of care: Telemetry Medical  Status is: Inpatient Remains inpatient appropriate because: severity of illness     Consultants:    Procedures:   Antimicrobials: levaquin   Subjective: Pt c/o sweating and cough.   Objective: Vitals:   09/15/23 0103 09/15/23 0503 09/15/23 0505 09/15/23 0801  BP:  (!) 141/70 (!) 142/66 (!) 145/75  Pulse:  86 86 84  Resp:  18 19 18   Temp:  98.1 F (36.7 C) 98.1 F (36.7 C) 97.7 F (36.5 C)  TempSrc:  Oral  Oral  SpO2: 94% 95% 97% 96%  Weight:      Height:        Intake/Output Summary (Last 24 hours) at 09/15/2023 0810 Last data filed at 09/15/2023 0516 Gross per 24 hour  Intake 1336.61 ml  Output 1100 ml  Net 236.61 ml   Filed Weights   09/14/23 2054  Weight: 113.4 kg    Examination:  General exam: Appears uncomfortable  Respiratory system: course breath sounds b/l  Cardiovascular system: S1 & S2+. No  rubs, gallops or clicks.  Gastrointestinal system: Abdomen is obese, soft and nontender. Normal bowel sounds heard. Central nervous system: Alert and oriented. Moves all extremities  Psychiatry: Judgement and insight appear normal. Flat mood and affect     Data Reviewed: I have personally reviewed following labs and imaging studies  CBC: Recent Labs  Lab 09/14/23 1157 09/14/23 2134 09/15/23 0126  WBC 4.9 5.4 5.3  NEUTROABS  --  4.6  --   HGB 11.8* 11.3* 11.0*  HCT 36.9* 35.0* 34.5*  MCV 85.6 85.6 86.0  PLT 290 285 287   Basic Metabolic Panel: Recent Labs  Lab 09/14/23 1157 09/14/23 2134 09/15/23 0126  NA 140 138 140  K 4.2 3.9 4.0  CL 105 107 108  CO2 23 23 21*  GLUCOSE 305* 217* 191*  BUN 16 17 17   CREATININE 1.04 1.04 0.93  CALCIUM  9.2 8.7* 8.5*   GFR: Estimated Creatinine Clearance: 97.5 mL/min (by C-G formula based on SCr of 0.93 mg/dL). Liver Function Tests: Recent Labs  Lab 09/14/23 2134  AST 38  ALT 47*  ALKPHOS 77  BILITOT 0.9  PROT 6.8  ALBUMIN 3.0*   No results for input(s): LIPASE, AMYLASE in the last 168 hours. No results for input(s): AMMONIA in the last 168 hours. Coagulation Profile: No results for input(s): INR, PROTIME in the last 168 hours. Cardiac Enzymes: No results for input(s):  CKTOTAL, CKMB, CKMBINDEX, TROPONINI in the last 168 hours. BNP (last 3 results) No results for input(s): PROBNP in the last 8760 hours. HbA1C: No results for input(s): HGBA1C in the last 72 hours. CBG: No results for input(s): GLUCAP in the last 168 hours. Lipid Profile: No results for input(s): CHOL, HDL, LDLCALC, TRIG, CHOLHDL, LDLDIRECT in the last 72 hours. Thyroid Function Tests: No results for input(s): TSH, T4TOTAL, FREET4, T3FREE, THYROIDAB in the last 72 hours. Anemia Panel: No results for input(s): VITAMINB12, FOLATE, FERRITIN, TIBC, IRON, RETICCTPCT in the last 72 hours. Sepsis  Labs: No results for input(s): PROCALCITON, LATICACIDVEN in the last 168 hours.  Recent Results (from the past 240 hours)  SARS Coronavirus 2 by RT PCR (hospital order, performed in [facility] hospital lab) *cepheid single result test* Anterior Nasal Swab     Status: None   Collection Time: 09/14/23  8:56 PM   Specimen: Anterior Nasal Swab  Result Value Ref Range Status   SARS Coronavirus 2 by RT PCR NEGATIVE NEGATIVE Final    Comment: (NOTE) SARS-CoV-2 target nucleic acids are NOT DETECTED.  The SARS-CoV-2 RNA is generally detectable in upper and lower respiratory specimens during the acute phase of infection. The lowest concentration of SARS-CoV-2 viral copies this assay can detect is 250 copies / mL. A negative result does not preclude SARS-CoV-2 infection and should not be used as the sole basis for treatment or other patient management decisions.  A negative result may occur with improper specimen collection / handling, submission of specimen other than nasopharyngeal swab, presence of viral mutation(s) within the areas targeted by this assay, and inadequate number of viral copies (<250 copies / mL). A negative result must be combined with clinical observations, patient history, and epidemiological information.  Fact Sheet for Patients:   RoadLapTop.co.za  Fact Sheet for Healthcare Providers: http://kim-miller.com/  This test is not yet approved or  cleared by the [place]  FDA and has been authorized for detection and/or diagnosis of SARS-CoV-2 by FDA under an Emergency Use Authorization (EUA).  This EUA will remain in effect (meaning this test can be used) for the duration of the COVID-19 declaration under Section 564(b)(1) of the Act, 21 U.S.C. section 360bbb-3(b)(1), unless the authorization is terminated or revoked sooner.  Performed at [facility], [address] [place], [place] [address]   Resp  panel by RT-PCR (RSV, Flu A&B, Covid) Anterior Nasal Swab     Status: None   Collection Time: 09/14/23  9:48 PM   Specimen: Anterior Nasal Swab  Result Value Ref Range Status   SARS Coronavirus 2 by RT PCR NEGATIVE NEGATIVE Final    Comment: (NOTE) SARS-CoV-2 target nucleic acids are NOT DETECTED.  The SARS-CoV-2 RNA is generally detectable in upper respiratory specimens during the acute phase of infection. The lowest concentration of SARS-CoV-2 viral copies this assay can detect is 138 copies/mL. A negative result does not preclude SARS-Cov-2 infection and should not be used as the sole basis for treatment or other patient management decisions. A negative result may occur with  improper specimen collection/handling, submission of specimen other than nasopharyngeal swab, presence of viral mutation(s) within the areas targeted by this assay, and inadequate number of viral copies(<138 copies/mL). A negative result must be combined with clinical observations, patient history, and epidemiological information. The expected result is Negative.  Fact Sheet for Patients:  BloggerCourse.com  Fact Sheet for Healthcare Providers:  SeriousBroker.it  This test is no t yet approved or cleared by the  [place]  FDA and  has been authorized for detection and/or diagnosis of SARS-CoV-2 by FDA under an Emergency Use Authorization (EUA). This EUA will remain  in effect (meaning this test can be used) for the duration of the COVID-19 declaration under Section 564(b)(1) of the Act, 21 U.S.C.section 360bbb-3(b)(1), unless the authorization is terminated  or revoked sooner.       Influenza A by PCR NEGATIVE NEGATIVE Final   Influenza B by PCR NEGATIVE NEGATIVE Final    Comment: (NOTE) The Xpert Xpress SARS-CoV-2/FLU/RSV plus assay is intended as an aid in the diagnosis of influenza from Nasopharyngeal swab specimens and should not be used as a  sole basis for treatment. Nasal washings and aspirates are unacceptable for Xpert Xpress SARS-CoV-2/FLU/RSV testing.  Fact Sheet for Patients: BloggerCourse.com  Fact Sheet for Healthcare Providers: SeriousBroker.it  This test is not yet approved or cleared by the [place]  FDA and has been authorized for detection and/or diagnosis of SARS-CoV-2 by FDA under an Emergency Use Authorization (EUA). This EUA will remain in effect (meaning this test can be used) for the duration of the COVID-19 declaration under Section 564(b)(1) of the Act, 21 U.S.C. section 360bbb-3(b)(1), unless the authorization is terminated or revoked.     Resp Syncytial Virus by PCR NEGATIVE NEGATIVE Final    Comment: (NOTE) Fact Sheet for Patients: BloggerCourse.com  Fact Sheet for Healthcare Providers: SeriousBroker.it  This test is not yet approved or cleared by the [place]  FDA and has been authorized for detection and/or diagnosis of SARS-CoV-2 by FDA under an Emergency Use Authorization (EUA). This EUA will remain in effect (meaning this test can be used) for the duration of the COVID-19 declaration under Section 564(b)(1) of the Act, 21 U.S.C. section 360bbb-3(b)(1), unless the authorization is terminated or revoked.  Performed at [facility], [address] [place], [place] [address]   Culture, blood (Routine X 2) w Reflex to ID Panel     Status: None (Preliminary result)   Collection Time: 09/14/23 11:16 PM   Specimen: BLOOD RIGHT HAND  Result Value Ref Range Status   Specimen Description BLOOD RIGHT HAND  Final   Special Requests   Final    BOTTLES DRAWN AEROBIC AND ANAEROBIC Blood Culture results may not be optimal due to an inadequate volume of blood received in culture bottles   Culture   Final    NO GROWTH < 12 HOURS Performed at [facility], [address], [place], [place] [address]    Report Status PENDING  Incomplete  Culture, blood (Routine X 2) w Reflex to ID Panel     Status: None (Preliminary result)   Collection Time: 09/14/23 11:16 PM   Specimen: BLOOD  Result Value Ref Range Status   Specimen Description BLOOD LEFT ANTECUBITAL  Final   Special Requests   Final    BOTTLES DRAWN AEROBIC AND ANAEROBIC Blood Culture results may not be optimal due to an inadequate volume of blood received in culture bottles   Culture   Final    NO GROWTH < 12 HOURS Performed at [facility], [address] [place], [place] [address]    Report Status PENDING  Incomplete         Radiology Studies: DG Chest 2 View Result Date: 09/14/2023 CLINICAL DATA:  Chest pain EXAM: CHEST - 2 VIEW COMPARISON:  07/26/2020 FINDINGS: Cardiac shadow is stable. Right chest wall port is been removed in the interval. Lungs are well aerated bilaterally. Linear  platelike atelectasis is noted in the left mid lung. No other focal abnormality is seen. IMPRESSION: Platelike atelectasis in the left mid lung. Electronically Signed   By: Violeta Grey M.D.   On: 09/14/2023 21:45        Scheduled Meds:  alfuzosin   10 mg Oral Daily   dutasteride   0.5 mg Oral Daily   enoxaparin  (LOVENOX ) injection  55 mg Subcutaneous Q24H   gabapentin   300 mg Oral BID   insulin  aspart  0-5 Units Subcutaneous QHS   insulin  aspart  0-9 Units Subcutaneous TID WC   ipratropium-albuterol   3 mL Nebulization Q4H   irbesartan  300 mg Oral Daily   methylPREDNISolone  (SOLU-MEDROL ) injection  80 mg Intravenous Q24H   metoprolol  succinate  50 mg Oral Daily   montelukast   10 mg Oral QHS   pregabalin   50 mg Oral TID WC   And   pregabalin   100 mg Oral QHS   rosuvastatin   40 mg Oral QHS   Continuous Infusions:  levofloxacin (LEVAQUIN) IV       LOS: 1 day      Alphonsus Jeans, MD [facility] Pager [phone]  If 7PM-7AM, please contact  night-coverage [email or web address] 09/15/2023, 8:10 AM

## 2023-09-15 NOTE — Plan of Care (Signed)
   Problem: Education: Goal: Knowledge of General Education information will improve Description Including pain rating scale, medication(s)/side effects and non-pharmacologic comfort measures Outcome: Progressing   Problem: Clinical Measurements: Goal: Ability to maintain clinical measurements within normal limits will improve Outcome: Progressing   Problem: Clinical Measurements: Goal: Will remain free from infection Outcome: Progressing

## 2023-09-16 DIAGNOSIS — J441 Chronic obstructive pulmonary disease with (acute) exacerbation: Secondary | ICD-10-CM | POA: Diagnosis not present

## 2023-09-16 LAB — BASIC METABOLIC PANEL WITH GFR
Anion gap: 9 (ref 5–15)
BUN: 28 mg/dL — ABNORMAL HIGH (ref 8–23)
CO2: 21 mmol/L — ABNORMAL LOW (ref 22–32)
Calcium: 8.6 mg/dL — ABNORMAL LOW (ref 8.9–10.3)
Chloride: 106 mmol/L (ref 98–111)
Creatinine, Ser: 0.99 mg/dL (ref 0.61–1.24)
GFR, Estimated: >60 mL/min (ref >60)
Glucose, Bld: 238 mg/dL — ABNORMAL HIGH (ref 70–99)
Potassium: 4 mmol/L (ref 3.5–5.1)
Sodium: 136 mmol/L (ref 135–145)

## 2023-09-16 LAB — CBC
HCT: 33.1 % — ABNORMAL LOW (ref 39.0–52.0)
Hemoglobin: 10.6 g/dL — ABNORMAL LOW (ref 13.0–17.0)
MCH: 27.2 pg (ref 26.0–34.0)
MCHC: 32 g/dL (ref 30.0–36.0)
MCV: 84.9 fL (ref 80.0–100.0)
Platelets: 310 10*3/uL (ref 150–400)
RBC: 3.9 MIL/uL — ABNORMAL LOW (ref 4.22–5.81)
RDW: 17.2 % — ABNORMAL HIGH (ref 11.5–15.5)
WBC: 7.9 10*3/uL (ref 4.0–10.5)
nRBC: 0 % (ref 0.0–0.2)

## 2023-09-16 MED ORDER — BENZONATATE 100 MG PO CAPS
200.0000 mg | ORAL_CAPSULE | Freq: Three times a day (TID) | ORAL | Status: DC
  Administered 2023-09-16 – 2023-09-17 (×4): 200 mg via ORAL
  Filled 2023-09-16 (×4): qty 2

## 2023-09-16 MED ORDER — IPRATROPIUM-ALBUTEROL 0.5-2.5 (3) MG/3ML IN SOLN
3.0000 mL | Freq: Four times a day (QID) | RESPIRATORY_TRACT | Status: DC
  Administered 2023-09-16 – 2023-09-17 (×3): 3 mL via RESPIRATORY_TRACT
  Filled 2023-09-16 (×5): qty 3

## 2023-09-16 MED ORDER — LEVOFLOXACIN 500 MG PO TABS
500.0000 mg | ORAL_TABLET | Freq: Every day | ORAL | Status: DC
  Administered 2023-09-16: 500 mg via ORAL
  Filled 2023-09-16: qty 1

## 2023-09-16 NOTE — Progress Notes (Signed)
 PROGRESS NOTE    Austin Sullivan  ZOX:[id number] DOB: 08/29/1955 DOA: 09/14/2023 PCP: Nestor Banter, MD    Assessment & Plan:   Principal Problem:   COPD exacerbation (HCC) Active Problems:   Essential hypertension   HLD (hyperlipidemia)   Diabetes mellitus without complication (HCC)   BPH (benign prostatic hyperplasia)   OSA (obstructive sleep apnea)   Obesity (BMI 30-39.9)  Assessment and Plan: COPD exacerbation: fever at 2 days ago as per pt, cannot completely rule out early stage of pneumonia. Continue on IV levaquin, steroids, bronchodilators & encourage incentive spirometry. Procal 0.54. Strep was neg   HTN: continue on metoprolol , ARB  HLD: continue on statin    DM2: well controlled, HbA1c 6.4. Continue on SSI w/ accuchecks    BPH: continue on home dose of dutasteride , alfuzosin    OSA: CPAP qhs   Obesity: BMI 32.9. Would benefit from weight loss   Generalized weakness: PT/OT consulted      DVT prophylaxis: lovenox   Code Status: full  Family Communication: discussed pt's care w/ pt's wife, Antoine Bathe, and answered her questions Disposition Plan: likely d/c back home   Level of care: Telemetry Medical  Status is: Inpatient Remains inpatient appropriate because: severity of illness     Consultants:    Procedures:   Antimicrobials: levaquin   Subjective: Pt c/o sweating and cough.   Objective: Vitals:   09/15/23 1548 09/15/23 1818 09/15/23 2000 09/16/23 0400  BP:   133/62 124/65  Pulse:   91 89  Resp:   20 20  Temp:   98.1 F (36.7 C) 98.2 F (36.8 C)  TempSrc:   Oral Oral  SpO2: 94% 92% 95% 94%  Weight:      Height:        Intake/Output Summary (Last 24 hours) at 09/16/2023 0805 Last data filed at 09/16/2023 0600 Gross per 24 hour  Intake 360 ml  Output 2825 ml  Net -2465 ml   Filed Weights   09/14/23 2054  Weight: 113.4 kg    Examination:  General exam: Appears uncomfortable. Obese  Respiratory system: course breath sounds b/l   Cardiovascular system: S1/S2+. No rubs or clicks  Gastrointestinal system: abd is soft, NT, obese & hypoactive bowel sounds  Central nervous system: alert & oriented. Moves all extremities  Psychiatry: Judgement and insight appears at baseline. Flat mood and affect     Data Reviewed: I have personally reviewed following labs and imaging studies  CBC: Recent Labs  Lab 09/14/23 1157 09/14/23 2134 09/15/23 0126 09/16/23 0314  WBC 4.9 5.4 5.3 7.9  NEUTROABS  --  4.6  --   --   HGB 11.8* 11.3* 11.0* 10.6*  HCT 36.9* 35.0* 34.5* 33.1*  MCV 85.6 85.6 86.0 84.9  PLT 290 285 287 310   Basic Metabolic Panel: Recent Labs  Lab 09/14/23 1157 09/14/23 2134 09/15/23 0126 09/16/23 0314  NA 140 138 140 136  K 4.2 3.9 4.0 4.0  CL 105 107 108 106  CO2 23 23 21* 21*  GLUCOSE 305* 217* 191* 238*  BUN 16 17 17  28*  CREATININE 1.04 1.04 0.93 0.99  CALCIUM  9.2 8.7* 8.5* 8.6*   GFR: Estimated Creatinine Clearance: 91.6 mL/min (by C-G formula based on SCr of 0.99 mg/dL). Liver Function Tests: Recent Labs  Lab 09/14/23 2134  AST 38  ALT 47*  ALKPHOS 77  BILITOT 0.9  PROT 6.8  ALBUMIN 3.0*   No results for input(s): LIPASE, AMYLASE in the last 168 hours. No results for  input(s): AMMONIA in the last 168 hours. Coagulation Profile: No results for input(s): INR, PROTIME in the last 168 hours. Cardiac Enzymes: No results for input(s): CKTOTAL, CKMB, CKMBINDEX, TROPONINI in the last 168 hours. BNP (last 3 results) No results for input(s): PROBNP in the last 8760 hours. HbA1C: No results for input(s): HGBA1C in the last 72 hours. CBG: No results for input(s): GLUCAP in the last 168 hours. Lipid Profile: No results for input(s): CHOL, HDL, LDLCALC, TRIG, CHOLHDL, LDLDIRECT in the last 72 hours. Thyroid Function Tests: No results for input(s): TSH, T4TOTAL, FREET4, T3FREE, THYROIDAB in the last 72 hours. Anemia Panel: No results for  input(s): VITAMINB12, FOLATE, FERRITIN, TIBC, IRON, RETICCTPCT in the last 72 hours. Sepsis Labs: Recent Labs  Lab 09/15/23 0126  PROCALCITON 0.54    Recent Results (from the past 240 hours)  SARS Coronavirus 2 by RT PCR (hospital order, performed in [facility] hospital lab) *cepheid single result test* Anterior Nasal Swab     Status: None   Collection Time: 09/14/23  8:56 PM   Specimen: Anterior Nasal Swab  Result Value Ref Range Status   SARS Coronavirus 2 by RT PCR NEGATIVE NEGATIVE Final    Comment: (NOTE) SARS-CoV-2 target nucleic acids are NOT DETECTED.  The SARS-CoV-2 RNA is generally detectable in upper and lower respiratory specimens during the acute phase of infection. The lowest concentration of SARS-CoV-2 viral copies this assay can detect is 250 copies / mL. A negative result does not preclude SARS-CoV-2 infection and should not be used as the sole basis for treatment or other patient management decisions.  A negative result may occur with improper specimen collection / handling, submission of specimen other than nasopharyngeal swab, presence of viral mutation(s) within the areas targeted by this assay, and inadequate number of viral copies (<250 copies / mL). A negative result must be combined with clinical observations, patient history, and epidemiological information.  Fact Sheet for Patients:   RoadLapTop.co.za  Fact Sheet for Healthcare Providers: http://kim-miller.com/  This test is not yet approved or  cleared by the [place]  FDA and has been authorized for detection and/or diagnosis of SARS-CoV-2 by FDA under an Emergency Use Authorization (EUA).  This EUA will remain in effect (meaning this test can be used) for the duration of the COVID-19 declaration under Section 564(b)(1) of the Act, 21 U.S.C. section 360bbb-3(b)(1), unless the authorization is terminated or revoked sooner.  Performed at  [facility], [address] [place], [place] [address]   Resp panel by RT-PCR (RSV, Flu A&B, Covid) Anterior Nasal Swab     Status: None   Collection Time: 09/14/23  9:48 PM   Specimen: Anterior Nasal Swab  Result Value Ref Range Status   SARS Coronavirus 2 by RT PCR NEGATIVE NEGATIVE Final    Comment: (NOTE) SARS-CoV-2 target nucleic acids are NOT DETECTED.  The SARS-CoV-2 RNA is generally detectable in upper respiratory specimens during the acute phase of infection. The lowest concentration of SARS-CoV-2 viral copies this assay can detect is 138 copies/mL. A negative result does not preclude SARS-Cov-2 infection and should not be used as the sole basis for treatment or other patient management decisions. A negative result may occur with  improper specimen collection/handling, submission of specimen other than nasopharyngeal swab, presence of viral mutation(s) within the areas targeted by this assay, and inadequate number of viral copies(<138 copies/mL). A negative result must be combined with clinical observations, patient history, and epidemiological information. The expected result is Negative.  Fact Sheet for Patients:  BloggerCourse.com  Fact Sheet for Healthcare Providers:  SeriousBroker.it  This test is no t yet approved or cleared by the [place]  FDA and  has been authorized for detection and/or diagnosis of SARS-CoV-2 by FDA under an Emergency Use Authorization (EUA). This EUA will remain  in effect (meaning this test can be used) for the duration of the COVID-19 declaration under Section 564(b)(1) of the Act, 21 U.S.C.section 360bbb-3(b)(1), unless the authorization is terminated  or revoked sooner.       Influenza A by PCR NEGATIVE NEGATIVE Final   Influenza B by PCR NEGATIVE NEGATIVE Final    Comment: (NOTE) The Xpert Xpress SARS-CoV-2/FLU/RSV plus assay is intended as an aid in the diagnosis of  influenza from Nasopharyngeal swab specimens and should not be used as a sole basis for treatment. Nasal washings and aspirates are unacceptable for Xpert Xpress SARS-CoV-2/FLU/RSV testing.  Fact Sheet for Patients: BloggerCourse.com  Fact Sheet for Healthcare Providers: SeriousBroker.it  This test is not yet approved or cleared by the [place]  FDA and has been authorized for detection and/or diagnosis of SARS-CoV-2 by FDA under an Emergency Use Authorization (EUA). This EUA will remain in effect (meaning this test can be used) for the duration of the COVID-19 declaration under Section 564(b)(1) of the Act, 21 U.S.C. section 360bbb-3(b)(1), unless the authorization is terminated or revoked.     Resp Syncytial Virus by PCR NEGATIVE NEGATIVE Final    Comment: (NOTE) Fact Sheet for Patients: BloggerCourse.com  Fact Sheet for Healthcare Providers: SeriousBroker.it  This test is not yet approved or cleared by the [place]  FDA and has been authorized for detection and/or diagnosis of SARS-CoV-2 by FDA under an Emergency Use Authorization (EUA). This EUA will remain in effect (meaning this test can be used) for the duration of the COVID-19 declaration under Section 564(b)(1) of the Act, 21 U.S.C. section 360bbb-3(b)(1), unless the authorization is terminated or revoked.  Performed at [facility], [address] [place], [place] [address]   Culture, blood (Routine X 2) w Reflex to ID Panel     Status: None (Preliminary result)   Collection Time: 09/14/23 11:16 PM   Specimen: BLOOD RIGHT HAND  Result Value Ref Range Status   Specimen Description BLOOD RIGHT HAND  Final   Special Requests   Final    BOTTLES DRAWN AEROBIC AND ANAEROBIC Blood Culture results may not be optimal due to an inadequate volume of blood received in culture bottles   Culture   Final    NO  GROWTH 2 DAYS Performed at [facility], [address] [place], [place] [address]    Report Status PENDING  Incomplete  Culture, blood (Routine X 2) w Reflex to ID Panel     Status: None (Preliminary result)   Collection Time: 09/14/23 11:16 PM   Specimen: BLOOD  Result Value Ref Range Status   Specimen Description BLOOD LEFT ANTECUBITAL  Final   Special Requests   Final    BOTTLES DRAWN AEROBIC AND ANAEROBIC Blood Culture results may not be optimal due to an inadequate volume of blood received in culture bottles   Culture   Final    NO GROWTH 2 DAYS Performed at [facility], [address], [place], [place] [address]    Report Status PENDING  Incomplete         Radiology Studies: DG Chest 2 View Result Date: 09/14/2023 CLINICAL DATA:  Chest pain EXAM: CHEST - 2 VIEW  COMPARISON:  07/26/2020 FINDINGS: Cardiac shadow is stable. Right chest wall port is been removed in the interval. Lungs are well aerated bilaterally. Linear platelike atelectasis is noted in the left mid lung. No other focal abnormality is seen. IMPRESSION: Platelike atelectasis in the left mid lung. Electronically Signed   By: Violeta Grey M.D.   On: 09/14/2023 21:45        Scheduled Meds:  alfuzosin   10 mg Oral Daily   dutasteride   0.5 mg Oral Daily   enoxaparin  (LOVENOX ) injection  55 mg Subcutaneous Q24H   gabapentin   300 mg Oral BID   insulin  aspart  0-5 Units Subcutaneous QHS   insulin  aspart  0-9 Units Subcutaneous TID WC   ipratropium-albuterol   3 mL Nebulization Q4H   irbesartan  300 mg Oral Daily   methylPREDNISolone  (SOLU-MEDROL ) injection  80 mg Intravenous Q24H   metoprolol  succinate  50 mg Oral Daily   montelukast   10 mg Oral QHS   pregabalin   50 mg Oral TID WC   And   pregabalin   100 mg Oral QHS   rosuvastatin   40 mg Oral QHS   Continuous Infusions:  levofloxacin (LEVAQUIN) IV Stopped (09/15/23 2359)     LOS: 2 days      Alphonsus Jeans, MD [facility] Pager [phone]  If 7PM-7AM, please contact night-coverage [email or web address] 09/16/2023, 8:05 AM
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
## 2023-09-16 NOTE — Plan of Care (Signed)
  Problem: Clinical Measurements: Goal: Ability to maintain clinical measurements within normal limits will improve Outcome: Progressing   Problem: Activity: Goal: Ability to tolerate increased activity will improve Outcome: Progressing   Problem: Nutritional: Goal: Maintenance of adequate nutrition will improve Outcome: Progressing   Problem: Safety: Goal: Ability to remain free from injury will improve Outcome: Progressing   Problem: Skin Integrity: Goal: Risk for impaired skin integrity will decrease Outcome: Progressing
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
## 2023-09-17 DIAGNOSIS — J441 Chronic obstructive pulmonary disease with (acute) exacerbation: Secondary | ICD-10-CM | POA: Diagnosis not present

## 2023-09-17 LAB — BASIC METABOLIC PANEL WITH GFR
Anion gap: 10 (ref 5–15)
BUN: 31 mg/dL — ABNORMAL HIGH (ref 8–23)
CO2: 24 mmol/L (ref 22–32)
Calcium: 9 mg/dL (ref 8.9–10.3)
Chloride: 106 mmol/L (ref 98–111)
Creatinine, Ser: 0.93 mg/dL (ref 0.61–1.24)
GFR, Estimated: >60 mL/min (ref >60)
Glucose, Bld: 178 mg/dL — ABNORMAL HIGH (ref 70–99)
Potassium: 4.2 mmol/L (ref 3.5–5.1)
Sodium: 140 mmol/L (ref 135–145)

## 2023-09-17 LAB — GLUCOSE, CAPILLARY
Glucose-Capillary: 159 mg/dL — ABNORMAL HIGH (ref 70–99)
Glucose-Capillary: 236 mg/dL — ABNORMAL HIGH (ref 70–99)
Glucose-Capillary: 302 mg/dL — ABNORMAL HIGH (ref 70–99)
Glucose-Capillary: 176 mg/dL — ABNORMAL HIGH (ref 70–99)
Glucose-Capillary: 191 mg/dL — ABNORMAL HIGH (ref 70–99)
Glucose-Capillary: 219 mg/dL — ABNORMAL HIGH (ref 70–99)
Glucose-Capillary: 226 mg/dL — ABNORMAL HIGH (ref 70–99)
Glucose-Capillary: 251 mg/dL — ABNORMAL HIGH (ref 70–99)
Glucose-Capillary: 271 mg/dL — ABNORMAL HIGH (ref 70–99)
Glucose-Capillary: 273 mg/dL — ABNORMAL HIGH (ref 70–99)
Glucose-Capillary: 284 mg/dL — ABNORMAL HIGH (ref 70–99)
Glucose-Capillary: 300 mg/dL — ABNORMAL HIGH (ref 70–99)
Glucose-Capillary: 351 mg/dL — ABNORMAL HIGH (ref 70–99)

## 2023-09-17 LAB — CBC
HCT: 36.5 % — ABNORMAL LOW (ref 39.0–52.0)
Hemoglobin: 11.6 g/dL — ABNORMAL LOW (ref 13.0–17.0)
MCH: 26.9 pg (ref 26.0–34.0)
MCHC: 31.8 g/dL (ref 30.0–36.0)
MCV: 84.7 fL (ref 80.0–100.0)
Platelets: 360 10*3/uL (ref 150–400)
RBC: 4.31 MIL/uL (ref 4.22–5.81)
RDW: 17.2 % — ABNORMAL HIGH (ref 11.5–15.5)
WBC: 12 10*3/uL — ABNORMAL HIGH (ref 4.0–10.5)
nRBC: 0 % (ref 0.0–0.2)

## 2023-09-17 MED ORDER — PREDNISONE 10 MG PO TABS: ORAL_TABLET | ORAL | 0 refills | Status: DC

## 2023-09-17 MED ORDER — DOCUSATE SODIUM 100 MG PO CAPS
200.0000 mg | ORAL_CAPSULE | Freq: Two times a day (BID) | ORAL | Status: DC
  Administered 2023-09-17: 200 mg via ORAL
  Filled 2023-09-17: qty 2

## 2023-09-17 MED ORDER — POLYETHYLENE GLYCOL 3350 17 G PO PACK
17.0000 g | PACK | Freq: Every day | ORAL | Status: DC
  Administered 2023-09-17: 17 g via ORAL
  Filled 2023-09-17: qty 1

## 2023-09-17 MED ORDER — HYDROCOD POLI-CHLORPHE POLI ER 10-8 MG/5ML PO SUER: 5.0000 mL | Freq: Two times a day (BID) | ORAL | 0 refills | Status: AC | PRN

## 2023-09-17 MED ORDER — LEVOFLOXACIN 500 MG PO TABS: 500.0000 mg | ORAL_TABLET | Freq: Every day | ORAL | 0 refills | Status: AC
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
## 2023-09-17 NOTE — Care Management Important Message (Signed)
 Important Message  Patient Details  Name: Austin Sullivan MRN: [id number] Date of Birth: 1955/05/17   Important Message Given:  Yes - Medicare IM     Ashleynicole Mcclees W, CMA 09/17/2023, 1:16 PM

## 2023-09-17 NOTE — TOC Initial Note (Signed)
 Transition of Care [facility]) - Initial/Assessment Note    Patient Details  Name: Austin Sullivan MRN: [id number] Date of Birth: Feb 19, 1956  Transition of Care [facility]) CM/SW Contact:    Odilia Bennett, LCSW Phone Number: 09/17/2023, 11:35 AM  Clinical Narrative:  CSW met with patient. No family at bedside. CSW introduced role and explained that therapy recommendations would be discussed. He is agreeable to home health and prefers [facility]. Liaison accepted referral for PT and OT. No further concerns. CSW will continue to follow patient for support and facilitate return home once stable. Wife will transport him home at discharge.                Expected Discharge Plan: Home w Home Health Services Barriers to Discharge: Continued Medical Work up   Patient Goals and CMS Choice   CMS Medicare.gov Compare Post Acute Care list provided to:: Patient        Expected Discharge Plan and Services     Post Acute Care Choice: Home Health Living arrangements for the past 2 months: Single Family Home                           HH Arranged: PT, OT HH Agency: [facility] Date HH Agency Contacted: 09/17/23   Representative spoke with at [facility] Agency: Shaun  Prior Living Arrangements/Services Living arrangements for the past 2 months: Single Family Home Lives with:: Spouse Patient language and need for interpreter reviewed:: Yes Do you feel safe going back to the place where you live?: Yes      Need for Family Participation in Patient Care: Yes (Comment) Care giver support system in place?: Yes (comment) Current home services: DME Criminal Activity/Legal Involvement Pertinent to Current Situation/Hospitalization: No - Comment as needed  Activities of Daily Living   ADL Screening (condition at time of admission) Independently performs ADLs?: Yes (appropriate for developmental age) Is the patient deaf or have difficulty hearing?: Yes Does the patient have difficulty seeing,  even when wearing glasses/contacts?: No Does the patient have difficulty concentrating, remembering, or making decisions?: No  Permission Sought/Granted Permission sought to share information with : Facility Industrial/product designer granted to share information with : Yes, Verbal Permission Granted     Permission granted to share info w AGENCY: Home Health Agencies        Emotional Assessment Appearance:: Appears stated age Attitude/Demeanor/Rapport: Engaged, Gracious Affect (typically observed): Accepting, Appropriate, Calm, Pleasant Orientation: : Oriented to Self, Oriented to Place, Oriented to  Time, Oriented to Situation Alcohol / Substance Use: Not Applicable Psych Involvement: No (comment)  Admission diagnosis:  COPD exacerbation (HCC) [J44.1] Acute cough [R05.1] Patient Active Problem List   Diagnosis Date Noted   COPD exacerbation (HCC) 09/14/2023   HLD (hyperlipidemia) 09/14/2023   Diabetes mellitus without complication (HCC) 09/14/2023   OSA (obstructive sleep apnea) 09/14/2023   Obesity (BMI 30-39.9) 09/14/2023   BPH (benign prostatic hyperplasia) 09/14/2023   Status post revision of total knee replacement, 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   Ileus (HCC)    Sepsis associated hypotension (HCC) 07/26/2020   Essential hypertension 07/26/2020   AKI (acute kidney injury) (HCC) 07/26/2020   Hyperlipidemia 07/26/2020   S/P TKR (total knee replacement) using cement, left 07/20/2020   PCP:  Nestor Banter, MD Pharmacy:   [id number] [place], [place] - [address] [place] [address] Phone: [phone] Fax: [phone]  [id number] - [place], [place] -  [address] [place] [address] Phone: [phone] Fax: [phone]  [facility] - [place] , [place] - [address] [place]  [place] [address] Phone: [phone] Fax: [phone]  [id number] [place], [facility] -  [address] [place] [address] Phone: [phone] Fax: [phone]     Social Drivers of Health (SDOH) Social History: SDOH Screenings   Food Insecurity: No Food Insecurity (09/15/2023)  Housing: Low Risk  (09/15/2023)  Transportation Needs: No Transportation Needs (09/15/2023)  Utilities: Not At Risk (09/15/2023)  Financial Resource Strain: Low Risk  (06/18/2023)   Received from [facility]  Social Connections: Socially Integrated (09/15/2023)  Tobacco Use: Medium Risk (09/14/2023)   Received from [facility]   SDOH Interventions:     Readmission Risk Interventions     No data to display

## 2023-09-17 NOTE — Inpatient Diabetes Management (Signed)
 Inpatient Diabetes Program Recommendations  AACE/ADA: New Consensus Statement on Inpatient Glycemic Control  Target Ranges:  Prepandial:   less than 140 mg/dL      Peak postprandial:   less than 180 mg/dL (1-2 hours)      Critically ill patients:  140 - 180 mg/dL     Latest Reference Range & Units 09/16/23 17:05 09/16/23 21:07 09/17/23 08:43  Glucose-Capillary 70 - 99 mg/dL 696 (H) 295 (H) 284 (H)   Review of Glycemic Control  Diabetes history: DM2 Outpatient Diabetes medications: Metformin  1000 mg BID; Prednisone 10 mg QAM Current orders for Inpatient glycemic control: Novolog  0-9 units TID with meals, Novolog  0-5 units at bedtime; Solumedrol 80 mg Q24H  Inpatient Diabetes Program Recommendations:    Insulin : If steroids are continued, please consider ordering Novolog  3 units TID with meals for meal coverage if patient eats at least 50% of meals.  Thanks, Beacher Limerick, RN, MSN, CDCES Diabetes Coordinator Inpatient Diabetes Program [phone] (Team Pager from 8am to 5pm)
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
## 2023-09-17 NOTE — TOC Transition Note (Signed)
 Transition of Care [facility]) - Discharge Note   Patient Details  Name: Austin Sullivan MRN: [id number] Date of Birth: [birth year]  Transition of Care [facility]) CM/SW Contact:  Odilia Bennett, LCSW Phone Number: 09/17/2023, 12:48 PM   Clinical Narrative: Patient has orders to discharge home today. [facility] liaison is aware. No further concerns. CSW signing off.    Final next level of care: Home w Home Health Services Barriers to Discharge: Barriers Resolved   Patient Goals and CMS Choice   CMS Medicare.gov Compare Post Acute Care list provided to:: Patient        Discharge Placement                Patient to be transferred to facility by: Wife   Patient and family notified of of transfer: 09/17/23  Discharge Plan and Services Additional resources added to the After Visit Summary for       Post Acute Care Choice: Home Health                    HH Arranged: PT, OT [facility] Agency: [facility] Date [facility] Agency Contacted: 09/17/23   Representative spoke with at [facility] Agency: Shaun  Social Drivers of Health (SDOH) Interventions SDOH Screenings   Food Insecurity: No Food Insecurity (09/15/2023)  Housing: Low Risk  (09/15/2023)  Transportation Needs: No Transportation Needs (09/15/2023)  Utilities: Not At Risk (09/15/2023)  Financial Resource Strain: Low Risk  (06/18/2023)   Received from [facility]  Social Connections: Socially Integrated (09/15/2023)  Tobacco Use: Medium Risk (09/14/2023)   Received from [facility]     Readmission Risk Interventions     No data to display

## 2023-09-19 LAB — CULTURE, BLOOD (ROUTINE X 2)
Culture: NO GROWTH
Culture: NO GROWTH

## 2023-09-20 ENCOUNTER — Ambulatory Visit: Admission: RE | Admit: 2023-09-20 | Source: Home / Self Care | Admitting: Surgery

## 2023-09-20 ENCOUNTER — Encounter: Admission: RE | Payer: Self-pay | Source: Home / Self Care

## 2023-09-20 HISTORY — DX: Obstructive sleep apnea (adult) (pediatric): G47.33

## 2023-09-20 HISTORY — DX: Other forms of dyspnea: R06.09

## 2023-09-20 SURGERY — ARTHROSCOPY, SHOULDER WITH DEBRIDEMENT
Anesthesia: Choice | Site: Shoulder | Laterality: Right

## 2023-10-08 ENCOUNTER — Telehealth: Payer: Self-pay

## 2023-10-08 NOTE — Telephone Encounter (Signed)
 Patient came in office today with concerns of use with the 5FU/Calcipotriene Cream. Patient started the application before Dr. Hester wanted him to and did longer than 7 days of treatment. Patient advised to d/c topical at this time and start wound care. Patient advised to not use aloe for wound care treatment. aw

## 2023-10-12 ENCOUNTER — Other Ambulatory Visit: Payer: Self-pay | Admitting: Surgery

## 2023-10-19 ENCOUNTER — Ambulatory Visit
Admission: EM | Admit: 2023-10-19 | Discharge: 2023-10-19 | Disposition: A | Discharge status: Home / Self Care | Attending: Emergency Medicine | Admitting: Emergency Medicine

## 2023-10-19 ENCOUNTER — Encounter: Payer: Self-pay | Admitting: Emergency Medicine

## 2023-10-19 ENCOUNTER — Other Ambulatory Visit: Payer: Self-pay

## 2023-10-19 ENCOUNTER — Encounter
Admission: RE | Admit: 2023-10-19 | Discharge: 2023-10-19 | Disposition: A | Discharge status: Home / Self Care | Source: Ambulatory Visit | Attending: Surgery | Admitting: Surgery

## 2023-10-19 ENCOUNTER — Encounter: Payer: Self-pay | Admitting: Surgery

## 2023-10-19 VITALS — Wt 247.0 lb

## 2023-10-19 DIAGNOSIS — E119 Type 2 diabetes mellitus without complications: Secondary | ICD-10-CM

## 2023-10-19 DIAGNOSIS — R21 Rash and other nonspecific skin eruption: Secondary | ICD-10-CM

## 2023-10-19 MED ORDER — PREDNISONE 10 MG (21) PO TBPK
ORAL_TABLET | Freq: Every day | ORAL | 0 refills | Status: DC
Start: 2023-10-19 — End: 2023-10-25

## 2023-10-19 MED ORDER — DEXAMETHASONE SODIUM PHOSPHATE 10 MG/ML IJ SOLN
10.0000 mg | Freq: Once | INTRAMUSCULAR | Status: AC
Start: 2023-10-19 — End: 2023-10-19
  Administered 2023-10-19: 10 mg via INTRAMUSCULAR

## 2023-10-19 NOTE — ED Provider Notes (Addendum)
 Austin Sullivan    CSN: [id number] Arrival date & time: 10/19/23  1711      History   Chief Complaint Chief Complaint  Patient presents with   Rash    HPI Austin Sullivan is a 68 y.o. male.   Patient presents for evaluation of a erythematous pruritic rash present to the bilateral upper extremities beginning 2 days ago.  Completed yard work and weed pulling 3 days ago before symptoms began.  Denies drainage or fever.  Denies changes in toiletries, diet or recent travel, no other contact.   Has attempted use of CeraVe anti-itch which has been ineffective.  Past Medical History:  Diagnosis Date   Arthritis    Asthma    Basal cell carcinoma 01/30/2019   Left ear, superior helix. Nodular and infiltrative patterns. Simple excision/EDC.   Basal cell carcinoma 11/03/2019   left lat brow   Basal cell carcinoma 06/30/2021   left forehead above lat brow, schedule Mohs   Basal cell carcinoma 01/05/2022   left superior helix, EDC   BPH (benign prostatic hyperplasia)    COPD (chronic obstructive pulmonary disease) (HCC)    Diabetes mellitus without complication (HCC)    Dyspnea on exertion    History of kidney stones    HLD (hyperlipidemia)    Hodgkin's lymphoma (HCC)    chemotherapy. now in remission   Hypertension    Irritable bowel syndrome with constipation    Melanoma (HCC) 2001   Right forehead. Metastatic melanoma 2016   Obesity    OSA on CPAP    Peripheral neuropathy    Primary osteoarthritis of right shoulder 2025   Rotator cuff tendinitis, right 2025   Sepsis (HCC) 07/2020   developed stones after TKR and was readmit to hospital for this   Vocal cord paralysis    wedge placed on cords so he can speak    Patient Active Problem List   Diagnosis Date Noted   COPD exacerbation (HCC) 09/14/2023   HLD (hyperlipidemia) 09/14/2023   Diabetes mellitus without complication (HCC) 09/14/2023   OSA (obstructive sleep apnea) 09/14/2023   Obesity (BMI 30-39.9) 09/14/2023    BPH (benign prostatic hyperplasia) 09/14/2023   Status post revision of total knee replacement, 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   Ileus (HCC)    Sepsis associated hypotension (HCC) 07/26/2020   Essential hypertension 07/26/2020   AKI (acute kidney injury) (HCC) 07/26/2020   Hyperlipidemia 07/26/2020   S/P TKR (total knee replacement) using cement, left 07/20/2020    Past Surgical History:  Procedure Laterality Date   bph     COLONOSCOPY     COLONOSCOPY WITH PROPOFOL  N/A 10/03/2022   Procedure: COLONOSCOPY WITH PROPOFOL ;  Surgeon: Maryruth Ole DASEN, MD;  Location: [facility];  Service: Endoscopy;  Laterality: N/A;   CYSTOSCOPY WITH STENT PLACEMENT Bilateral 07/26/2020   Procedure: CYSTOSCOPY WITH STENT PLACEMENT;  Surgeon: Francisca Redell BROCKS, MD;  Location: [facility] ORS;  Service: Urology;  Laterality: Bilateral;   CYSTOSCOPY/URETEROSCOPY/HOLMIUM LASER/STENT PLACEMENT Bilateral 08/27/2020   Procedure: CYSTOSCOPY/URETEROSCOPY/HOLMIUM LASER/STENT PLACEMENT;  Surgeon: Francisca Redell BROCKS, MD;  Location: [facility] ORS;  Service: Urology;  Laterality: Bilateral;   EYE SURGERY     carterat surgery x 2   FRACTURE SURGERY     both wrist ahs metal plates   JOINT REPLACEMENT     left hip, LEFT  KNEE   nasal cavity surgery     septoplasty   ORCHIECTOMY     only 1 testicle removed d/t lump on that side   port  a cath place  2017   removed port a cath  2022   TOTAL HIP ARTHROPLASTY Left    TOTAL KNEE ARTHROPLASTY Left 07/20/2020   Procedure: TOTAL KNEE ARTHROPLASTY - [place] Amber to Assist;  Surgeon: Kathlynn Sharper, MD;  Location: [facility] ORS;  Service: Orthopedics;  Laterality: Left;   TOTAL KNEE REVISION Left 11/01/2021   Procedure: Left knee revision, polyethylene exchange;  Surgeon: Kathlynn Sharper, MD;  Location: [facility] ORS;  Service: Orthopedics;  Laterality: Left;   trachostomy     vocal cord spacer  2017   WRIST SURGERY Bilateral    PLATES AND SCREWS       Home Medications    Prior to Admission medications    Medication Sig Start Date End Date Taking? Authorizing Provider  predniSONE  (STERAPRED UNI-PAK 21 TAB) 10 MG (21) TBPK tablet Take by mouth daily. Take 6 tabs by mouth daily  for 1 days, then 5 tabs for 1 days, then 4 tabs for 1 days, then 3 tabs for 1 days, 2 tabs for 1 days, then 1 tab by mouth daily for 1 days 10/19/23  Yes Lakoda Mcanany R, NP  albuterol  (VENTOLIN  HFA) 108 (90 Base) MCG/ACT inhaler Inhale 2 puffs into the lungs every 6 (six) hours as needed for wheezing or shortness of breath. 12/07/22   [provider]  alfuzosin  (UROXATRAL ) 10 MG 24 hr tablet Take 10 mg by mouth daily. 03/15/20   [provider]  aspirin EC 81 MG tablet Take 81 mg by mouth daily. 03/07/22   [provider]  Azelastine  HCl 137 MCG/SPRAY SOLN Place 1 spray into the nose in the morning and at bedtime. 03/16/20   [provider]  Coenzyme Q10 100 MG capsule Take 100 mg by mouth daily.    [provider]  dutasteride  (AVODART ) 0.5 MG capsule Take 0.5 mg by mouth daily. 03/11/20   [provider]  fexofenadine (ALLEGRA) 180 MG tablet Take 180 mg by mouth daily.    [provider]  fluticasone  (FLONASE ) 50 MCG/ACT nasal spray Place 2 sprays into both nostrils daily. 03/12/20   [provider]  fluticasone  furoate-vilanterol (BREO ELLIPTA ) 100-25 MCG/INH AEPB Inhale 1 puff into the lungs daily.    [provider]  gabapentin  (NEURONTIN ) 300 MG capsule Take 300 mg by mouth 2 (two) times daily.    [provider]  ipratropium (ATROVENT ) 0.06 % nasal spray Place 2 sprays into both nostrils daily. 04/21/20   [provider]  ipratropium-albuterol  (DUONEB) 0.5-2.5 (3) MG/3ML SOLN Inhale 3 mLs into the lungs 2 (two) times daily as needed for shortness of breath. 07/09/20   [provider]  metFORMIN  (GLUCOPHAGE ) 1000 MG tablet Take 1,000 mg by mouth 2 (two) times daily. 04/15/20   [provider]  metoprolol   succinate (TOPROL -XL) 50 MG 24 hr tablet Take 50 mg by mouth daily. 03/07/22   [provider]  montelukast  (SINGULAIR ) 10 MG tablet Take 10 mg by mouth at bedtime. 03/21/20   [provider]  pregabalin  (LYRICA ) 50 MG capsule Take 50 mg by mouth See admin instructions. 50 mg with each meal, 100 mg at bedtime 03/22/20   [provider]  rosuvastatin  (CRESTOR ) 40 MG tablet Take 40 mg by mouth at bedtime. 03/15/20   [provider]  telmisartan (MICARDIS) 80 MG tablet Take 80 mg by mouth daily.    [provider]  torsemide (DEMADEX) 20 MG tablet Take 20 mg by mouth every other day. 03/06/23  [provider]    Family History Family History  Problem Relation Age of Onset   Asthma Sister    Heart disease Brother     Social History Social History   Tobacco Use   Smoking status: Former    Current packs/day: 0.00    Types: Cigarettes    Quit date: 1978    Years since quitting: 47.5   Smokeless tobacco: Former  Building services engineer status: Never Used  Substance Use Topics   Alcohol use: Yes    Alcohol/week: 7.0 standard drinks of alcohol    Types: 7 Glasses of wine per week    Comment: OCCASIONAL   Drug use: Not Currently     Allergies   Ancef  [cefazolin ] and Bee venom   Review of Systems Review of Systems  Skin:  Positive for rash.     Physical Exam Triage Vital Signs ED Triage Vitals  Encounter Vitals Group     BP 10/19/23 1728 (!) 165/75     Girls Systolic BP Percentile --      Girls Diastolic BP Percentile --      Boys Systolic BP Percentile --      Boys Diastolic BP Percentile --      Pulse Rate 10/19/23 1728 90     Resp 10/19/23 1728 20     Temp 10/19/23 1728 98.2 F (36.8 C)     Temp Source 10/19/23 1728 Oral     SpO2 10/19/23 1728 95 %     Weight --      Height --      Head Circumference --      Peak Flow --      Pain Score 10/19/23 1732 1     Pain Loc --      Pain Education --      Exclude from  Growth Chart --    No data found.  Updated Vital Signs BP (!) 165/75 (BP Location: Right Arm)   Pulse 90   Temp 98.2 F (36.8 C) (Oral)   Resp 20   SpO2 95%   Visual Acuity Right Eye Distance:   Left Eye Distance:   Bilateral Distance:    Right Eye Near:   Left Eye Near:    Bilateral Near:     Physical Exam Constitutional:      Appearance: Normal appearance.  Eyes:     Extraocular Movements: Extraocular movements intact.  Pulmonary:     Effort: Pulmonary effort is normal.  Skin:    Comments: Erythematous papular rash present to the bilateral upper extremities  Neurological:     Mental Status: He is alert and oriented to person, place, and time.      UC Treatments / Results  Labs (all labs ordered are listed, but only abnormal results are displayed) Labs Reviewed - No data to display  EKG   Radiology No results found.  Procedures Procedures (including critical care time)  Medications Ordered in UC Medications  dexamethasone  (DECADRON ) injection 10 mg (10 mg Intramuscular Given 10/19/23 1758)    Initial Impression / Assessment and Plan / UC Course  I have reviewed the triage vital signs and the nursing notes.  Pertinent labs & imaging results that were available during my care of the patient were reviewed by me and considered in my medical decision making (see chart for details).  Rash  Appears inflammatory, no signs of infection, Decadron  IM given and prescribed prednisone  for home use recommended over-the-counter medications and nonpharmacological  supportive measures with follow-up as needed Final Clinical Impressions(s) / UC Diagnoses   Final diagnoses:  Rash and nonspecific skin eruption     Discharge Instructions      Today you are being treated for the   You have been given an injection of steroids today in the office today to help reduce the inflammatory process that occurs with this rash which will help minimize your itching as well as  begin to clear  Starting tomorrow take prednisone  every morning with food as directed, to continue the above process  You may continue use of topical calamine or Benadryl  cream to help manage itching, you may also continue oral Benadryl   Please avoid long exposures to heat such as a hot steamy shower or being outside as this may cause further irritation to your rash  You may follow-up with his urgent care as needed if symptoms persist or worsen     ED Prescriptions     Medication Sig Dispense Auth. Provider   predniSONE  (STERAPRED UNI-PAK 21 TAB) 10 MG (21) TBPK tablet Take by mouth daily. Take 6 tabs by mouth daily  for 1 days, then 5 tabs for 1 days, then 4 tabs for 1 days, then 3 tabs for 1 days, 2 tabs for 1 days, then 1 tab by mouth daily for 1 days 21 tablet Suman Trivedi, Shelba SAUNDERS, NP      PDMP not reviewed this encounter.   Teresa Shelba SAUNDERS, NP 10/19/23 1804    Teresa Shelba SAUNDERS, NP 10/19/23 [id number]

## 2023-10-19 NOTE — Patient Instructions (Addendum)
 Your procedure is scheduled on: Thursday 10/25/2023 Report to the Registration Desk on the 1st floor of the [facility]. To find out your arrival time, please call [phone] between 1PM - 3PM on: Wednesday, July 23 If your arrival time is 6:00 am, do not arrive before that time as the [facility] entrance doors do not open until 6:00 am.   REMEMBER: Instructions that are not followed completely may result in serious medical risk, up to and including death; or upon the discretion of your surgeon and anesthesiologist your surgery may need to be rescheduled.   Do not eat food after midnight the night before surgery.  No gum chewing or hard candies.   You may however, drink CLEAR liquids up to 2 hours before you are scheduled to arrive for your surgery. Do not drink anything within 2 hours of your scheduled arrival time.   Clear liquids include: - water  Do NOT drink anything that is not on this list.      In addition, your doctor has ordered for you to drink the provided:  Gatorade G2 Drinking this carbohydrate drink up to two hours before surgery helps to reduce insulin  resistance and improve patient outcomes. Please complete drinking 2 hours before scheduled arrival time.   One week prior to surgery: Stop Anti-inflammatories (NSAIDS) such as Advil, Aleve, Ibuprofen, Motrin, Naproxen, Naprosyn and  based products such as Excedrin, Goody's Powder, BC Powder. Stop ANY OVER THE COUNTER supplements until after surgery.   Stop metFORMIN  (GLUCOPHAGE ) 1000 MG 2 days prior to surgery (take last dose 10/22/2023, Monday )   You may however, continue to take Tylenol  if needed for pain up until the day of surgery.   Continue taking all of your other prescription medications up until the day of surgery.   ON THE DAY OF SURGERY ONLY TAKE THESE MEDICATIONS WITH SIPS OF WATER:   alfuzosin  (UROXATRAL ) 10 MG 24 hr  dutasteride  (AVODART ) 0.5 mg gabapentin  (NEURONTIN ) 300 MG  metoprolol   succinate (TOPROL -XL) 50 MG 24 hr    Use inhalers on the day of surgery and bring to the hospital. albuterol  (VENTOLIN  HFA) 108 (90 Base) MCG/ACT inhaler  fluticasone  furoate-vilanterol (BREO ELLIPTA ) 100-25 MCG/INH AEPB    No Alcohol for 24 hours before or after surgery.   No Smoking including e-cigarettes for 24 hours before surgery.  No chewable tobacco products for at least 6 hours before surgery.  No nicotine patches on the day of surgery.   Do not use any recreational drugs for at least a week (preferably 2 weeks) before your surgery.  Please be advised that the combination of cocaine and anesthesia may have negative outcomes, up to and including death. If you test positive for cocaine, your surgery will be cancelled.   On the morning of surgery brush your teeth with toothpaste and water, you may rinse your mouth with mouthwash if you wish. Do not swallow any toothpaste or mouthwash.   Use CHG Soap or wipes as directed on instruction sheet._Provided for you    Do not wear jewelry, make-up, hairpins, clips or nail polish.   For welded (permanent) jewelry: bracelets, anklets, waist bands, etc.  Please have this removed prior to surgery.  If it is not removed, there is a chance that hospital personnel will need to cut it off on the day of surgery.   Do not wear lotions, powders, or perfumes.    Do not shave body hair from the neck down 48 hours before surgery.  Contact lenses, hearing aids and dentures may not be worn into surgery.   Do not bring valuables to the hospital. [facility] is not responsible for any missing/lost belongings or valuables.    Notify your doctor if there is any change in your medical condition (cold, fever, infection).   Wear comfortable clothing (specific to your surgery type) to the hospital.   After surgery, you can help prevent lung complications by doing breathing exercises.  Take deep breaths and cough every 1-2 hours. Your doctor may order a  device called an Incentive Spirometer to help you take deep breaths. If you are being admitted to the hospital overnight, leave your suitcase in the car. After surgery it may be brought to your room.   In case of increased patient census, it may be necessary for you, the patient, to continue your postoperative care in the Same Day Surgery department.   If you are being discharged the day of surgery, you will not be allowed to drive home. You will need a responsible individual to drive you home and stay with you for 24 hours after surgery.       Please call the Pre-admissions Testing Dept. at [phone] if you have any questions about these instructions.   Surgery Visitation Policy:   Patients having surgery or a procedure may have two visitors.  Children under the age of 80 must have an adult with them who is not the patient.   Inpatient Visitation:     Visiting hours are 7 a.m. to 8 p.m. Up to four visitors are allowed at one time in a patient room. The visitors may rotate out with other people during the day. One visitor age 69 or older may stay with the patient overnight and must be in the room by 8 p.m.          Preparing for Surgery with CHLORHEXIDINE  GLUCONATE (CHG) Soap   Chlorhexidine  Gluconate (CHG) Soap   o An antiseptic cleaner that kills germs and bonds with the skin to continue killing germs even after washing   o Used for showering the night before surgery and morning of surgery   Before surgery, you can play an important role by reducing the number of germs on your skin.  CHG (Chlorhexidine  gluconate) soap is an antiseptic cleanser which kills germs and bonds with the skin to continue killing germs even after washing.   Please do not use if you have an allergy to CHG or antibacterial soaps. If your skin becomes reddened/irritated stop using the CHG.   1. Shower the NIGHT BEFORE SURGERY and the MORNING OF SURGERY with CHG soap.   2. If you choose to wash  your hair, wash your hair first as usual with your normal shampoo.   3. After shampooing, rinse your hair and body thoroughly to remove the shampoo.   4. Use CHG as you would any other liquid soap. You can apply CHG directly to the skin and wash gently with a scrungie or a clean washcloth.   5. Apply the CHG soap to your body only from the neck down. Do not use on open wounds or open sores. Avoid contact with your eyes, ears, mouth, and genitals (private parts). Wash face and genitals (private parts) with your normal soap.   6. Wash thoroughly, paying special attention to the area where your surgery will be performed.   7. Thoroughly rinse your body with warm water.   8. Do not shower/wash with your normal  soap after using and rinsing off the CHG soap.   9. Pat yourself dry with a clean towel.   10. Wear clean pajamas to bed the night before surgery.   12. Place clean sheets on your bed the night of your first shower and do not sleep with pets.   13. Shower again with the CHG soap on the day of surgery prior to arriving at the hospital.   14. Do not apply any deodorants/lotions/powders.   15. Please wear clean clothes to the hospital.

## 2023-10-19 NOTE — Pre-Procedure Instructions (Signed)
 PAT interview completed. Patient was given pre -anesthesia instructions and patient verbalized understanding.

## 2023-10-19 NOTE — Discharge Instructions (Signed)
 Today you are being treated for the   You have been given an injection of steroids today in the office today to help reduce the inflammatory process that occurs with this rash which will help minimize your itching as well as begin to clear  Starting tomorrow take prednisone  every morning with food as directed, to continue the above process  You may continue use of topical calamine or Benadryl  cream to help manage itching, you may also continue oral Benadryl   Please avoid long exposures to heat such as a hot steamy shower or being outside as this may cause further irritation to your rash  You may follow-up with his urgent care as needed if symptoms persist or worsen

## 2023-10-19 NOTE — ED Triage Notes (Signed)
 Patient reports red itchy, painful rash on bilateral arms and back that started yesterday morning at 9 am . Patient has been using anti-itch cream with no relief. Rates pain 1/10. Patient took Tylenol  at 4 pm today for pain.

## 2023-10-25 ENCOUNTER — Ambulatory Visit: Payer: Self-pay | Admitting: Urgent Care

## 2023-10-25 ENCOUNTER — Encounter
Admission: RE | Disposition: A | Discharge status: Home / Self Care | Payer: Self-pay | Source: Home / Self Care | Attending: Surgery

## 2023-10-25 ENCOUNTER — Other Ambulatory Visit: Payer: Self-pay

## 2023-10-25 ENCOUNTER — Encounter: Payer: Self-pay | Admitting: Surgery

## 2023-10-25 ENCOUNTER — Ambulatory Visit
Admission: RE | Admit: 2023-10-25 | Discharge: 2023-10-25 | Disposition: A | Discharge status: Home / Self Care | Attending: Surgery | Admitting: Surgery

## 2023-10-25 ENCOUNTER — Ambulatory Visit

## 2023-10-25 DIAGNOSIS — Z87891 Personal history of nicotine dependence: Secondary | ICD-10-CM | POA: Insufficient documentation

## 2023-10-25 DIAGNOSIS — Z8521 Personal history of malignant neoplasm of larynx: Secondary | ICD-10-CM | POA: Insufficient documentation

## 2023-10-25 DIAGNOSIS — Z6831 Body mass index (BMI) 31.0-31.9, adult: Secondary | ICD-10-CM | POA: Diagnosis not present

## 2023-10-25 DIAGNOSIS — G4730 Sleep apnea, unspecified: Secondary | ICD-10-CM | POA: Diagnosis not present

## 2023-10-25 DIAGNOSIS — M25811 Other specified joint disorders, right shoulder: Secondary | ICD-10-CM | POA: Insufficient documentation

## 2023-10-25 DIAGNOSIS — E119 Type 2 diabetes mellitus without complications: Secondary | ICD-10-CM

## 2023-10-25 DIAGNOSIS — X58XXXA Exposure to other specified factors, initial encounter: Secondary | ICD-10-CM | POA: Diagnosis not present

## 2023-10-25 DIAGNOSIS — E669 Obesity, unspecified: Secondary | ICD-10-CM | POA: Insufficient documentation

## 2023-10-25 DIAGNOSIS — M19011 Primary osteoarthritis, right shoulder: Secondary | ICD-10-CM | POA: Diagnosis present

## 2023-10-25 DIAGNOSIS — E1142 Type 2 diabetes mellitus with diabetic polyneuropathy: Secondary | ICD-10-CM | POA: Insufficient documentation

## 2023-10-25 DIAGNOSIS — M75121 Complete rotator cuff tear or rupture of right shoulder, not specified as traumatic: Secondary | ICD-10-CM | POA: Diagnosis not present

## 2023-10-25 DIAGNOSIS — I10 Essential (primary) hypertension: Secondary | ICD-10-CM | POA: Diagnosis not present

## 2023-10-25 DIAGNOSIS — S3131XA Laceration without foreign body of scrotum and testes, initial encounter: Secondary | ICD-10-CM | POA: Diagnosis not present

## 2023-10-25 DIAGNOSIS — M7581 Other shoulder lesions, right shoulder: Secondary | ICD-10-CM | POA: Diagnosis present

## 2023-10-25 DIAGNOSIS — J449 Chronic obstructive pulmonary disease, unspecified: Secondary | ICD-10-CM | POA: Diagnosis not present

## 2023-10-25 HISTORY — PX: SUBACROMIAL DECOMPRESSION: SHX5174

## 2023-10-25 HISTORY — PX: BICEPT TENODESIS: SHX5116

## 2023-10-25 HISTORY — PX: POSTERIOR LUMBAR FUSION 2 WITH HARDWARE REMOVAL: SHX7297

## 2023-10-25 HISTORY — PX: SHOULDER OPEN ROTATOR CUFF REPAIR: SHX2407

## 2023-10-25 LAB — GLUCOSE, CAPILLARY
Glucose-Capillary: 171 mg/dL — ABNORMAL HIGH (ref 70–99)
Glucose-Capillary: 190 mg/dL — ABNORMAL HIGH (ref 70–99)

## 2023-10-25 SURGERY — ARTHROSCOPY, SHOULDER WITH DEBRIDEMENT
Anesthesia: Regional | Site: Shoulder | Laterality: Right

## 2023-10-25 MED ORDER — OXYCODONE HCL 5 MG PO TABS: 5.0000 mg | ORAL_TABLET | ORAL | Status: DC | PRN

## 2023-10-25 MED ORDER — EPHEDRINE 5 MG/ML INJ
INTRAVENOUS | Status: AC
  Filled 2023-10-25: qty 5

## 2023-10-25 MED ORDER — ACETAMINOPHEN 10 MG/ML IV SOLN
INTRAVENOUS | Status: DC | PRN
  Administered 2023-10-25: 1000 mg via INTRAVENOUS

## 2023-10-25 MED ORDER — BUPIVACAINE LIPOSOME 1.3 % IJ SUSP
INTRAMUSCULAR | Status: AC
  Filled 2023-10-25: qty 20

## 2023-10-25 MED ORDER — FENTANYL CITRATE PF 50 MCG/ML IJ SOSY
50.0000 ug | PREFILLED_SYRINGE | Freq: Once | INTRAMUSCULAR | Status: AC
  Administered 2023-10-25: 50 ug via INTRAVENOUS

## 2023-10-25 MED ORDER — KETOROLAC TROMETHAMINE 15 MG/ML IJ SOLN: 15.0000 mg | Freq: Once | INTRAMUSCULAR | Status: DC

## 2023-10-25 MED ORDER — ROCURONIUM BROMIDE 10 MG/ML (PF) SYRINGE
PREFILLED_SYRINGE | INTRAVENOUS | Status: AC
Start: 2023-10-25 — End: 2023-10-25
  Filled 2023-10-25: qty 10

## 2023-10-25 MED ORDER — BUPIVACAINE HCL (PF) 0.5 % IJ SOLN
INTRAMUSCULAR | Status: AC
  Filled 2023-10-25: qty 10

## 2023-10-25 MED ORDER — MIDAZOLAM HCL 2 MG/2ML IJ SOLN
1.0000 mg | INTRAMUSCULAR | Status: DC | PRN
  Administered 2023-10-25: 1 mg via INTRAVENOUS

## 2023-10-25 MED ORDER — METOCLOPRAMIDE HCL 5 MG/ML IJ SOLN: 5.0000 mg | Freq: Three times a day (TID) | INTRAMUSCULAR | Status: DC | PRN

## 2023-10-25 MED ORDER — SUGAMMADEX SODIUM 200 MG/2ML IV SOLN
INTRAVENOUS | Status: DC | PRN
  Administered 2023-10-25 (×2): 100 mg via INTRAVENOUS

## 2023-10-25 MED ORDER — CHLORHEXIDINE GLUCONATE 0.12 % MT SOLN
OROMUCOSAL | Status: AC
  Filled 2023-10-25: qty 15

## 2023-10-25 MED ORDER — ACETAMINOPHEN 10 MG/ML IV SOLN: 1000.0000 mg | Freq: Once | INTRAVENOUS | Status: DC | PRN

## 2023-10-25 MED ORDER — FENTANYL CITRATE (PF) 100 MCG/2ML IJ SOLN: 25.0000 ug | INTRAMUSCULAR | Status: DC | PRN

## 2023-10-25 MED ORDER — KETOROLAC TROMETHAMINE 30 MG/ML IJ SOLN
INTRAMUSCULAR | Status: AC
  Filled 2023-10-25: qty 1

## 2023-10-25 MED ORDER — DEXAMETHASONE SODIUM PHOSPHATE 10 MG/ML IJ SOLN
INTRAMUSCULAR | Status: DC | PRN
  Administered 2023-10-25: 10 mg via INTRAVENOUS

## 2023-10-25 MED ORDER — LEVOFLOXACIN IN D5W 500 MG/100ML IV SOLN
INTRAVENOUS | Status: AC
  Filled 2023-10-25: qty 100

## 2023-10-25 MED ORDER — SODIUM CHLORIDE 0.9 % IV SOLN: INTRAVENOUS | Status: DC

## 2023-10-25 MED ORDER — PHENYLEPHRINE HCL-NACL 20-0.9 MG/250ML-% IV SOLN
INTRAVENOUS | Status: AC
  Filled 2023-10-25: qty 250

## 2023-10-25 MED ORDER — BUPIVACAINE-EPINEPHRINE 0.5% -1:200000 IJ SOLN
INTRAMUSCULAR | Status: DC | PRN
  Administered 2023-10-25: 30 mL

## 2023-10-25 MED ORDER — ROCURONIUM BROMIDE 100 MG/10ML IV SOLN
INTRAVENOUS | Status: DC | PRN
  Administered 2023-10-25: 50 mg via INTRAVENOUS
  Administered 2023-10-25: 10 mg via INTRAVENOUS

## 2023-10-25 MED ORDER — SUCCINYLCHOLINE CHLORIDE 200 MG/10ML IV SOSY
PREFILLED_SYRINGE | INTRAVENOUS | Status: AC
  Filled 2023-10-25: qty 10

## 2023-10-25 MED ORDER — ONDANSETRON HCL 4 MG PO TABS: 4.0000 mg | ORAL_TABLET | Freq: Four times a day (QID) | ORAL | Status: DC | PRN

## 2023-10-25 MED ORDER — KETOROLAC TROMETHAMINE 30 MG/ML IJ SOLN
INTRAMUSCULAR | Status: DC | PRN
  Administered 2023-10-25: 15 mg via INTRAVENOUS

## 2023-10-25 MED ORDER — METOCLOPRAMIDE HCL 10 MG PO TABS: 5.0000 mg | ORAL_TABLET | Freq: Three times a day (TID) | ORAL | Status: DC | PRN

## 2023-10-25 MED ORDER — 0.9 % SODIUM CHLORIDE (POUR BTL) OPTIME
TOPICAL | Status: DC | PRN
  Administered 2023-10-25: 500 mL

## 2023-10-25 MED ORDER — SUCCINYLCHOLINE CHLORIDE 200 MG/10ML IV SOSY
PREFILLED_SYRINGE | INTRAVENOUS | Status: DC | PRN
  Administered 2023-10-25: 100 mg via INTRAVENOUS

## 2023-10-25 MED ORDER — PROPOFOL 10 MG/ML IV BOLUS
INTRAVENOUS | Status: AC
  Filled 2023-10-25: qty 20

## 2023-10-25 MED ORDER — VANCOMYCIN HCL 1500 MG/300ML IV SOLN
1500.0000 mg | INTRAVENOUS | Status: AC
  Administered 2023-10-25: 1500 mg via INTRAVENOUS
  Filled 2023-10-25: qty 300

## 2023-10-25 MED ORDER — BUPIVACAINE LIPOSOME 1.3 % IJ SUSP
INTRAMUSCULAR | Status: DC | PRN
Start: 2023-10-25 — End: 2023-10-25
  Administered 2023-10-25: 20 mL

## 2023-10-25 MED ORDER — EPHEDRINE SULFATE-NACL 50-0.9 MG/10ML-% IV SOSY
PREFILLED_SYRINGE | INTRAVENOUS | Status: DC | PRN
  Administered 2023-10-25: 10 mg via INTRAVENOUS
  Administered 2023-10-25: 5 mg via INTRAVENOUS
  Administered 2023-10-25: 10 mg via INTRAVENOUS

## 2023-10-25 MED ORDER — ORAL CARE MOUTH RINSE: 15.0000 mL | Freq: Once | OROMUCOSAL | Status: AC

## 2023-10-25 MED ORDER — PROPOFOL 10 MG/ML IV BOLUS
INTRAVENOUS | Status: DC | PRN
  Administered 2023-10-25: 160 mg via INTRAVENOUS

## 2023-10-25 MED ORDER — MIDAZOLAM HCL 2 MG/2ML IJ SOLN
INTRAMUSCULAR | Status: AC
  Filled 2023-10-25: qty 2

## 2023-10-25 MED ORDER — RINGERS IRRIGATION IR SOLN
Status: DC | PRN
  Administered 2023-10-25: 1

## 2023-10-25 MED ORDER — OXYCODONE HCL 5 MG PO TABS: 5.0000 mg | ORAL_TABLET | ORAL | 0 refills | Status: AC | PRN

## 2023-10-25 MED ORDER — ACETAMINOPHEN 325 MG PO TABS: 325.0000 mg | ORAL_TABLET | Freq: Four times a day (QID) | ORAL | Status: DC | PRN

## 2023-10-25 MED ORDER — ACETAMINOPHEN 10 MG/ML IV SOLN
INTRAVENOUS | Status: AC
  Filled 2023-10-25: qty 100

## 2023-10-25 MED ORDER — BUPIVACAINE HCL (PF) 0.5 % IJ SOLN
INTRAMUSCULAR | Status: DC | PRN
  Administered 2023-10-25: 10 mL

## 2023-10-25 MED ORDER — CHLORHEXIDINE GLUCONATE 0.12 % MT SOLN
15.0000 mL | Freq: Once | OROMUCOSAL | Status: AC
  Administered 2023-10-25: 15 mL via OROMUCOSAL

## 2023-10-25 MED ORDER — OXYCODONE HCL 5 MG PO TABS: 5.0000 mg | ORAL_TABLET | Freq: Once | ORAL | Status: DC | PRN

## 2023-10-25 MED ORDER — ONDANSETRON HCL 4 MG/2ML IJ SOLN
4.0000 mg | Freq: Four times a day (QID) | INTRAMUSCULAR | Status: DC | PRN
Start: 2023-10-25 — End: 2023-10-25

## 2023-10-25 MED ORDER — BUPIVACAINE-EPINEPHRINE (PF) 0.5% -1:200000 IJ SOLN
INTRAMUSCULAR | Status: AC
Start: 2023-10-25 — End: 2023-10-25
  Filled 2023-10-25: qty 10

## 2023-10-25 MED ORDER — ONDANSETRON HCL 4 MG/2ML IJ SOLN
INTRAMUSCULAR | Status: DC | PRN
  Administered 2023-10-25: 4 mg via INTRAVENOUS

## 2023-10-25 MED ORDER — OXYCODONE HCL 5 MG/5ML PO SOLN: 5.0000 mg | Freq: Once | ORAL | Status: DC | PRN

## 2023-10-25 MED ORDER — ONDANSETRON HCL 4 MG/2ML IJ SOLN: 4.0000 mg | Freq: Once | INTRAMUSCULAR | Status: DC | PRN

## 2023-10-25 MED ORDER — VANCOMYCIN HCL IN DEXTROSE 1-5 GM/200ML-% IV SOLN: 1000.0000 mg | INTRAVENOUS | Status: DC

## 2023-10-25 MED ORDER — EPINEPHRINE PF 1 MG/ML IJ SOLN
INTRAMUSCULAR | Status: AC
  Filled 2023-10-25: qty 1

## 2023-10-25 MED ORDER — LACTATED RINGERS IV SOLN
INTRAVENOUS | Status: DC | PRN
  Administered 2023-10-25: 1 mL

## 2023-10-25 MED ORDER — LEVOFLOXACIN IN D5W 500 MG/100ML IV SOLN
500.0000 mg | INTRAVENOUS | Status: AC
  Administered 2023-10-25: 500 mg via INTRAVENOUS

## 2023-10-25 MED ORDER — LACTATED RINGERS IV SOLN: INTRAVENOUS | Status: DC

## 2023-10-25 MED ORDER — FENTANYL CITRATE PF 50 MCG/ML IJ SOSY
PREFILLED_SYRINGE | INTRAMUSCULAR | Status: AC
  Filled 2023-10-25: qty 1

## 2023-10-25 MED ORDER — PHENYLEPHRINE HCL-NACL 20-0.9 MG/250ML-% IV SOLN
INTRAVENOUS | Status: DC | PRN
  Administered 2023-10-25: 30 ug/min via INTRAVENOUS

## 2023-10-25 SURGICAL SUPPLY — 47 items
ANCHOR BONE REGENETEN (Anchor) IMPLANT
ANCHOR HEALICOIL REGEN 5.5 (Anchor) IMPLANT
ANCHOR JUGGERKNOT SOFT 2.9 (Anchor) IMPLANT
ANCHOR QFIX 2.8 SUT MINI TAPE (Anchor) IMPLANT
ANCHOR TENDON REGENETEN (Staple) IMPLANT
BIT DRILL JUGRKNT W/NDL BIT2.9 (DRILL) IMPLANT
BLADE FULL RADIUS 3.5 (BLADE) ×1 IMPLANT
BUR ACROMIONIZER 4.0 (BURR) ×1 IMPLANT
CHLORAPREP W/TINT 26 (MISCELLANEOUS) ×1 IMPLANT
COVER MAYO STAND STRL (DRAPES) ×1 IMPLANT
DILATOR 5.5 THREADED HEALICOIL (MISCELLANEOUS) IMPLANT
ELECT CAUTERY BLADE 6.4 (BLADE) ×1 IMPLANT
ELECTRODE REM PT RTRN 9FT ADLT (ELECTROSURGICAL) ×1 IMPLANT
GAUZE SPONGE 4X4 12PLY STRL (GAUZE/BANDAGES/DRESSINGS) ×1 IMPLANT
GAUZE XEROFORM 1X8 LF (GAUZE/BANDAGES/DRESSINGS) ×1 IMPLANT
GLOVE BIO SURGEON STRL SZ7.5 (GLOVE) ×2 IMPLANT
GLOVE BIO SURGEON STRL SZ8 (GLOVE) ×2 IMPLANT
GLOVE BIOGEL PI IND STRL 8 (GLOVE) ×1 IMPLANT
GLOVE INDICATOR 8.0 STRL GRN (GLOVE) ×1 IMPLANT
GOWN STRL REUS W/ TWL LRG LVL3 (GOWN DISPOSABLE) ×1 IMPLANT
GOWN STRL REUS W/ TWL XL LVL3 (GOWN DISPOSABLE) ×1 IMPLANT
GRASPER SUT 15 45D LOW PRO (SUTURE) IMPLANT
IMPL REGENETEN MEDIUM (Shoulder) IMPLANT
IV LR IRRIG 3000ML ARTHROMATIC (IV SOLUTION) ×1 IMPLANT
KIT CANNULA 8X76-LX IN CANNULA (CANNULA) ×1 IMPLANT
KIT SUTURE 1.8 Q-FIX DISP (KITS) IMPLANT
KIT SUTURE 2.8 Q-FIX DISP (MISCELLANEOUS) IMPLANT
MANIFOLD NEPTUNE II (INSTRUMENTS) ×1 IMPLANT
MASK FACE SPIDER DISP (MASK) ×1 IMPLANT
MAT ABSORB FLUID 56X50 GRAY (MISCELLANEOUS) ×1 IMPLANT
PACK ARTHROSCOPY SHOULDER (MISCELLANEOUS) ×1 IMPLANT
PAD ABD DERMACEA PRESS 5X9 (GAUZE/BANDAGES/DRESSINGS) ×1 IMPLANT
PASSER SUT FIRSTPASS SELF (INSTRUMENTS) IMPLANT
PENCIL SMOKE EVACUATOR (MISCELLANEOUS) ×1 IMPLANT
SLING ULTRA II LG (MISCELLANEOUS) ×1 IMPLANT
SPONGE T-LAP 18X18 ~~LOC~~+RFID (SPONGE) ×1 IMPLANT
STAPLER SKIN PROX 35W (STAPLE) ×1 IMPLANT
STRAP SAFETY 5IN WIDE (MISCELLANEOUS) ×1 IMPLANT
SUT CHROMIC 3 0 SH 27 (SUTURE) IMPLANT
SUT ETHIBOND 0 MO6 C/R (SUTURE) ×1 IMPLANT
SUT ULTRABRAID 2 COBRAID 38 (SUTURE) IMPLANT
SUT VIC AB 2-0 CT1 TAPERPNT 27 (SUTURE) ×2 IMPLANT
TAPE MICROFOAM 4IN (TAPE) ×1 IMPLANT
TRAP FLUID SMOKE EVACUATOR (MISCELLANEOUS) ×1 IMPLANT
TUBE SET DOUBLEFLO INFLOW (TUBING) ×1 IMPLANT
TUBING CONNECTING 10 (TUBING) ×1 IMPLANT
WAND WEREWOLF FLOW 90D (MISCELLANEOUS) ×1 IMPLANT

## 2023-10-25 NOTE — Progress Notes (Signed)
 Pharmacy Antibiotic Note  Austin Sullivan is a 68 y.o. male admitted on 10/25/2023 for surgical procedure.   Vancomycin  1000mg  pre-op currently ordered  Plan: Based on weight >100 kg vancomycin  dose was adjusted to vancomycin  1500mg  per protocol.   Estill CHRISTELLA Lutes, PharmD, BCPS Clinical Pharmacist 10/25/2023 7:44 AM

## 2023-10-25 NOTE — H&P (Signed)
 History of Present Illness:  Austin Sullivan is a 68 y.o. male who presents for follow-up of his right shoulder pain secondary to impingement/tendinopathy with an MRI documented full-thickness rotator cuff tear of the supraspinatus tendon. Overall, the patient feels that his symptoms are unchanged since his last visit with me 3.5 months ago. He continues to describe moderate pain in his shoulder which he rates at 4/10 on today's visit. His symptoms are worse with activities at or above shoulder level, as well as when trying to reach behind his back. He also has pain at night. He had been scheduled to undergo surgery on the right shoulder last month to include a right shoulder arthroscopy debridement, decompression, rotator cuff repair, and biceps tenodesis, but the surgery was postponed due to a flare-up of his underlying pulmonary issues. These symptoms have resolved and the patient has been cleared by his pulmonologist to proceed with the surgery. The patient denies any reinjury to the shoulder since his last visit, and denies any numbness or paresthesias down his arm to his hand.  Current Outpatient Medications:  sildenafiL  (VIAGRA ) 100 MG tablet Take by mouth  albuterol  MDI, PROVENTIL , VENTOLIN , PROAIR , HFA 90 mcg/actuation inhaler Inhale 2 inhalations into the lungs every 6 (six) hours as needed for Wheezing for up to 90 days 3 each 3  alfuzosin  (UROXATRAL ) 10 mg ER tablet TAKE 1 TABLET BY MOUTH EVERY DAY 90 tablet 1  aspirin 81 MG EC tablet Take 1 tablet (81 mg total) by mouth once daily  azelastine  (ASTELIN ) 137 mcg nasal spray Place 1 spray into both nostrils 2 (two) times daily  BREO ELLIPTA  100-25 mcg/dose DsDv inhaler USE 1 INHALATION ORALLY ONCE DAILY 180 g 3  coenzyme Q10-vitamin E 100-5 mg-unit Cap Take 100 mg by mouth once daily  dutasteride  (AVODART ) 0.5 mg capsule TAKE 1 CAPSULE (0.5 MG TOTAL) BY MOUTH ONCE DAILY 90 capsule 3  fexofenadine (ALLEGRA) 180 MG tablet Take 180 mg by mouth once  daily  fluticasone  propionate (FLONASE ) 50 mcg/actuation nasal spray Place 2 sprays into both nostrils once daily 48 g 3  gabapentin  (NEURONTIN ) 300 MG capsule Take 1 capsule (300 mg total) by mouth 2 (two) times daily 180 capsule 1  ipratropium (ATROVENT ) 0.06 % nasal spray SPRAY 2 SPRAYS INTO EACH NOSTRIL 3 TIMES A DAY AS NEEDED FOR RHINITIS 45 mL 11  ipratropium-albuteroL  (DUO-NEB) nebulizer solution Take 3 mLs by nebulization 2 (two) times daily as needed for Shortness of Breath for up to 360 days 540 mL 1  metFORMIN  (GLUCOPHAGE ) 1000 MG tablet TAKE 1 TABLET BY MOUTH TWICE A DAY WITH FOOD 180 tablet 1  metoprolol  SUCCinate (TOPROL -XL) 50 MG XL tablet TAKE 1 TABLET ONCE DAILY 90 tablet 1  montelukast  (SINGULAIR ) 10 mg tablet TAKE 1 TABLET BY MOUTH EVERY DAY AT NIGHT 90 tablet 3  pregabalin  (LYRICA ) 50 MG capsule TAKE 1 CAPSULE WITH EACH MEAL THEN TAKE 2 CAPSULES AT BEDTIME  rosuvastatin  (CRESTOR ) 40 MG tablet take 1 tablet by mouth every day 90 tablet 3  telmisartan (MICARDIS) 80 MG tablet TAKE 1 TABLET (80 MG TOTAL) BY MOUTH ONCE DAILY. 90 tablet 1  TORsemide (DEMADEX) 20 MG tablet Take 1 tablet (20 mg total) by mouth every other day for 90 days 90 tablet 0   Allergies:  Venom-Honey Bee Swelling  Ancef  [Cefazolin ] Rash  Bee Sting Kit Hives   Past Medical History:  Allergic rhinitis due to allergen 06/09/1970  Allergy  Asthma, unspecified asthma severity, unspecified whether complicated, unspecified whether  persistent (HHS-HCC)  COPD (chronic obstructive pulmonary disease) (CMS/HHS-HCC)  Dermatitis 08/10/1997  Diabetes mellitus without complication (CMS/HHS-HCC)  Edema  History of cancer  melanoma  Hyperlipidemia  Hypertension  Obesity  Sleep apnea   Past Surgical History:   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 Kathlynn)  Colon @ [facility] 10/03/2022 (Normal examined colon/Repeat 11yrs/CTL)  CATARACT EXTRACTION  Deviated septum  Melanoma right side forehead  Spacer in throat   Testical removed Left  Torn retina Bilateral   Family History:  Diabetes type II Mother Marshall  Myocardial Infarction (Heart attack) Mother Marshall  Stroke Mother Marshall  Allergies Mother Marshall  Myocardial Infarction (Heart attack) Father  High blood pressure (Hypertension) Sister Ronal  Stroke Brother Aleene  Diabetes type II Brother Philip  Lymphoma Brother Philip  Cancer Brother Philip  Myocardial Infarction (Heart attack) Maternal Grandmother  Myocardial Infarction (Heart attack) Maternal Grandfather  Dementia Paternal Grandmother  High blood pressure (Hypertension) Sister Candis  Asthma Sister Candis  COPD Sister Candis   Social History:   Socioeconomic History:  Marital status: Married  Tobacco Use  Smoking status: Former  Current packs/day: 0.00  Average packs/day: 1 pack/day for 4.0 years (4.0 ttl pk-yrs)  Types: Cigarettes  Start date: 04/03/1972  Quit date: 04/03/1976  Years since quitting: 47.5  Smokeless tobacco: Never  Vaping Use  Vaping status: Never Used  Substance and Sexual Activity  Alcohol use: Yes  Alcohol/week: 1.0 standard drink of alcohol  Types: 1 Glasses of wine per week  Drug use: Not Currently  Sexual activity: Not Currently  Partners: Female  Birth control/protection: Condom, Abstinence, None   Social Drivers of Health:   Physicist, medical Strain: Low Risk (06/18/2023)  Overall Financial Resource Strain (CARDIA)  Difficulty of Paying Living Expenses: Not hard at all  Food Insecurity: No Food Insecurity (09/15/2023)  Received from [facility]  Hunger Vital Sign  Within the past 12 months, you worried that your food would run out before you got the money to buy more.: Never true  Within the past 12 months, the food you bought just didn't last and you didn't have money to get more.: Never true  Transportation Needs: No Transportation Needs (09/15/2023)  Received from [facility] - Transportation  In the past 12 months, has lack of  transportation kept you from medical appointments or from getting medications?: No  In the past 12 months, has lack of transportation kept you from meetings, work, or from getting things needed for daily living?: No   Review of Systems:  A comprehensive 14 point ROS was performed, reviewed, and the pertinent orthopaedic findings are documented in the HPI.  Physical Exam: Vitals:  10/15/23 0941  BP: (!) 160/70  Weight: (!) 112.1 kg (247 lb 3.2 oz)  Height: 188 cm (6' 2)  PainSc: 4  PainLoc: Shoulder   General/Constitutional: The patient appears to be well-nourished, well-developed, and in no acute distress. Neuro/Psych: Normal mood and affect, oriented to person, place and time. Eyes: Non-icteric. Pupils are equal, round, and reactive to light, and exhibit synchronous movement. ENT: Unremarkable. Lymphatic: No palpable adenopathy. Respiratory: Lungs clear to auscultation, Normal chest excursion, No wheezes, and Non-labored breathing Cardiovascular: Regular rate and rhythm. No murmurs. and No edema, swelling or tenderness, except as noted in detailed exam. Integumentary: No impressive skin lesions present, except as noted in detailed exam. Musculoskeletal: Unremarkable, except as noted in detailed exam.  Right shoulder exam: SKIN: Normal SWELLING: None WARMTH: None LYMPH NODES: No adenopathy palpable CREPITUS:  None TENDERNESS: Mildly tender over anterolateral shoulder ROM (active):  Forward flexion: 100 degrees Abduction: 90 degree Internal rotation: L3 ROM (passive):  Forward flexion: 125 degrees Abduction: 110 degree ER/IR at 90 abd: 80 degrees/60 degrees  He describes moderate pain at the extremes of all motions.  STRENGTH: Forward flexion: 4/5 Abduction: 4/5 External rotation: 4-4+/5 Internal rotation: 4+/5 Pain with RC testing: Moderate pain with resisted forward flexion and abduction  STABILITY: Normal  SPECIAL TESTS: Vonzell' test: Mild-moderately  positive Speed's test: Negative Capsulitis - pain w/ passive ER: No Crossed arm test: Mildly positive Crank: Not evaluated Anterior apprehension: Negative Posterior apprehension: Not evaluated  He again is neurovascularly intact to the right upper extremity and hand.  Assessment: 1. Nontraumatic complete tear of right rotator cuff.  2. Rotator cuff tendinitis, right.  3. Tendinitis of upper biceps tendon of right shoulder.   Plan: The treatment options were discussed with the patient. In addition, patient educational materials were provided regarding the diagnosis and treatment options. The patient continues to be frustrated by his symptoms and functional limitations, and is ready to consider more aggressive treatment options. Therefore, I have recommended that we proceed with the planned surgery to include a right shoulder arthroscopy with debridement, decompression, rotator cuff repair, and biceps tenodesis. The procedure was discussed with the patient, as were the potential risks (including bleeding, infection, nerve and/or blood vessel injury, persistent or recurrent pain, failure of the repair, progression of arthritis, need for further surgery, blood clots, strokes, heart attacks and/or arhythmias, pneumonia, etc.) and benefits. The patient states his understanding and wishes to proceed. All of the patient's questions and concerns were answered. He can call any time with further concerns. He will follow up post-surgery, routine.    H&P reviewed and patient re-examined. No changes.

## 2023-10-25 NOTE — Anesthesia Procedure Notes (Signed)
 Anesthesia Regional Block: Interscalene brachial plexus block   Pre-Anesthetic Checklist: , timeout performed,  Correct Patient, Correct Site, Correct Laterality,  Correct Procedure,, risks and benefits discussed,  Surgical consent,  Pre-op evaluation,  At surgeon's request and post-op pain management  Laterality: Right  Prep: chloraprep       Needles:  Injection technique: Single-shot  Needle Type: Echogenic Needle          Additional Needles:   Procedures:,,,, ultrasound used (permanent image in chart),,   Motor weakness within 20 minutes.  Narrative:  Start time: 10/25/2023 9:45 AM End time: 10/25/2023 9:51 AM Injection made incrementally with aspirations every 5 mL.  Performed by: Personally  Anesthesiologist: Shellie Odor, MD  Additional Notes: Functioning IV was confirmed and monitors applied.  Sterile prep and drape, hand hygiene and sterile gloves were used. Ultrasound guidance: relevant anatomy identified, needle position confirmed, local anesthetic spread visualized around nerve(s), vascular puncture avoided.  Image saved to electronic medical record.  Negative aspiration prior to incremental administration of local anesthetic for total 20 ml Exparel  and 10 ml bupivacaine  0.5% given in interscalene distribution. The patient tolerated the procedure well. Vital signs and moderate sedation medications recorded in RN notes.

## 2023-10-25 NOTE — Anesthesia Procedure Notes (Signed)
 Procedure Name: Intubation Date/Time: 10/25/2023 10:49 AM  Performed by: Lorrene Camelia LABOR, CRNAPre-anesthesia Checklist: Patient identified, Patient being monitored, Timeout performed, Emergency Drugs available and Suction available Patient Re-evaluated:Patient Re-evaluated prior to induction Oxygen Delivery Method: Circle system utilized Preoxygenation: Pre-oxygenation with 100% oxygen Induction Type: IV induction Ventilation: Mask ventilation without difficulty Laryngoscope Size: Mac and 3 Grade View: Grade I Tube type: Oral Tube size: 7.5 mm Number of attempts: 1 Airway Equipment and Method: Stylet Placement Confirmation: ETT inserted through vocal cords under direct vision, positive ETCO2 and breath sounds checked- equal and bilateral Secured at: 24 cm Tube secured with: Tape Dental Injury: Teeth and Oropharynx as per pre-operative assessment  Comments: Soft bite block placed

## 2023-10-25 NOTE — Anesthesia Preprocedure Evaluation (Addendum)
 Anesthesia Evaluation  Patient identified by MRN, date of birth, ID band Patient awake    Reviewed: Allergy & Precautions, NPO status , Patient's Chart, lab work & pertinent test results  History of Anesthesia Complications Negative for: history of anesthetic complications  Airway Mallampati: IV   Neck ROM: Full    Dental   Bridge :   Pulmonary asthma , sleep apnea and Continuous Positive Airway Pressure Ventilation , COPD, former smoker (quit 1978) Hx tracheostomy, laryngeal CA   Pulmonary exam normal breath sounds clear to auscultation       Cardiovascular hypertension, Normal cardiovascular exam Rhythm:Regular Rate:Normal  ECG 09/14/23: ST with PACs   Neuro/Psych  Neuromuscular disease (peripheral neuropathy)    GI/Hepatic negative GI ROS,,,  Endo/Other  diabetes, Type 2  Obesity   Renal/GU      Musculoskeletal  (+) Arthritis ,    Abdominal   Peds  Hematology Metastatic melamoma   Anesthesia Other Findings Internal medicine note 09/27/23:  Impression/Plan  COPD: He is back to his baseline following this exacerbation. His lungs are clear on exam. He has no wheezing and no increased work of breathing. His oxygen saturation was 97% on room air today. He has been treated appropriately and clearly has improved in his symptoms as a result. He finishes out his prednisone  today and I don't think he needs additional medicine. He has an albuterol  inhaler that he can use as needed in addition to his baseline breo ellipta  dose.   DM: His blood sugar was very elevated when he was on higher dose prednisone . His blood sugar was 120 this morning and should continue to improve back to baseline once he is off prednisone .   Chronic anemia: His hemoglobin was 11.6 at the time he left the hospital.   Right rotator cuff tendonitis/OA of right shoulder: He is awaiting surgery. I believe he is back to his baseline at this point. He is at  moderate risk based on age, diabetes, underlying COPD, and HTN: However, his chronic medical conditions are as well controlled at this point as is going to be possible.   This visit was part of longitudinal care for this patient involving long-term management of chronic medical conditions.    Reproductive/Obstetrics                              Anesthesia Physical Anesthesia Plan  ASA: 3  Anesthesia Plan: General and Regional   Post-op Pain Management: Regional block*   Induction: Intravenous  PONV Risk Score and Plan: 2 and Ondansetron , Dexamethasone  and Treatment may vary due to age or medical condition  Airway Management Planned: Oral ETT  Additional Equipment:   Intra-op Plan:   Post-operative Plan: Extubation in OR  Informed Consent: I have reviewed the patients History and Physical, chart, labs and discussed the procedure including the risks, benefits and alternatives for the proposed anesthesia with the patient or authorized representative who has indicated his/her understanding and acceptance.     Dental advisory given  Plan Discussed with: CRNA  Anesthesia Plan Comments: (Plan for preoperative interscalene nerve block and GETA. Patient consented for risks of anesthesia including but not limited to:  - adverse reactions to medications - damage to eyes, teeth, lips or other oral mucosa - nerve damage due to positioning  - sore throat or hoarseness - damage to heart, brain, nerves, lungs, other parts of body or loss of life  Informed patient about role  of CRNA in peri- and intra-operative care.  Patient voiced understanding.)         Anesthesia Quick Evaluation

## 2023-10-25 NOTE — Op Note (Signed)
 10/25/2023  1:13 PM  Patient:   Austin Sullivan  Pre-Op Diagnosis:   Impingement/tendinopathy with full-thickness rotator cuff tear, right shoulder.  Post-Op Diagnosis:   1. Impingement/tendinopathy with full-thickness rotator cuff tear, degenerative labral fraying, and biceps tendinopathy, right shoulder.  2. Superficial skin tear of scrotum of unclear etiology.  Procedure:   1. Extensive arthroscopic debridement, arthroscopic subacromial decompression, mini-open rotator cuff repair, and mini-open biceps tenodesis, right shoulder. 2. Closure of 3-4 cm superficial skin tear of scrotum.  Anesthesia:   General endotracheal with interscalene block using Exparel  placed preoperatively by the anesthesiologist.  Surgeon:   DOROTHA Reyes Maltos, MD  Assistant:   Annabella Gambler, RN  Findings:   As above. There was a full-thickness tear involving the anterior middle portions of the supraspinatus tendon. There was some tendinopathic changes/superficial longitudinal tearing of the superior insertional fibers of the subscapularis tendon without compromise of the footprint. The remainder of the rotator cuff was in satisfactory condition. There were grade 2-3 chondromalacial changes involving the anterior portion of the glenoid. The humeral head articular surface appeared to be in satisfactory condition. The labrum demonstrated moderate fraying posterosuperiorly and superiorly without frank detachment from the glenoid. There were significant tendinopathic changes of the long head of the biceps tendon with partial-thickness tearing.  Complications:   Superficial skin tear of anterior inferior scrotum which was noted prior to prepping and draping the patient, but no clear etiology as to why it developed/occurred.  Estimated blood loss:   10 cc  Fluids:   200 cc  Tourniquet time:   None  Drains:   None  Closure:   Staples      Brief clinical note:   The patient is a 68 year old male with a history of  progressively worsening right shoulder pain. The patient's symptoms have progressed despite medications, activity modification, etc. The patient's history and examination are consistent with impingement/tendinopathy with a rotator cuff tear. These findings were confirmed by MRI scan. The patient presents at this time for definitive management of these shoulder symptoms.  Procedure:   The patient underwent placement of an interscalene block using Exparel  by the anesthesiologist in the preoperative holding area before being brought into the operating room and lain in the supine position. The patient then underwent general endotracheal intubation and anesthesia before being repositioned in the beach chair position using the beach chair positioner. The right shoulder and upper extremity were prepped with ChloraPrep solution before being draped sterilely. Preoperative antibiotics were administered. A timeout was performed to confirm the proper surgical site before the expected portal sites and incision site were injected with 0.5% Sensorcaine  with epinephrine .   A posterior portal was created and the glenohumeral joint thoroughly inspected with the findings as described above. An anterior portal was created using an outside-in technique. The labrum and rotator cuff were further probed, again confirming the above-noted findings. The areas of labral fraying were debrided back to stable margins using the full-radius resector. The areas of rotator cuff tearing also were debrided using the full-radius resector, as were areas of synovitis. The ArthroCare wand was inserted and used to release the biceps tendon from its labral anchor.  It also was used to obtain hemostasis as well as to anneal the labrum superiorly and anteriorly. The instruments were removed from the joint after suctioning the excess fluid.  The camera was repositioned through the posterior portal into the subacromial space. A separate lateral portal  was created using an outside-in technique. The 3.5 mm full-radius resector  was introduced and used to perform a subtotal bursectomy. The ArthroCare wand was then inserted and used to remove the periosteal tissue off the undersurface of the anterior third of the acromion as well as to recess the coracoacromial ligament from its attachment along the anterior and lateral margins of the acromion. The 4.0 mm acromionizing bur was introduced and used to complete the decompression by removing the undersurface of the anterior third of the acromion. The full radius resector was reintroduced to remove any residual bony debris before the ArthroCare wand was reintroduced to obtain hemostasis. The instruments were then removed from the subacromial space after suctioning the excess fluid.  An approximately 4-5 cm incision was made over the anterolateral aspect of the shoulder beginning at the anterolateral corner of the acromion and extending distally in line with the bicipital groove. This incision was carried down through the subcutaneous tissues to expose the deltoid fascia. The raphae between the anterior and middle thirds was identified and this plane developed to provide access into the subacromial space. Additional bursal tissues were debrided sharply using Metzenbaum scissors. The rotator cuff tear was readily identified.   The bicipital groove was identified by palpation and opened for 1-1.5 cm. The biceps tendon stump was retrieved through this defect. The floor of the bicipital groove was roughened with a curet before a Biomet 2.9 mm JuggerKnot anchor was inserted. Both sets of sutures were passed through the biceps tendon and tied securely to effect the tenodesis. The bicipital sheath was reapproximated using two #0 Ethibond interrupted sutures, incorporating the biceps tendon to further reinforce the tenodesis.  The margins of the rotator cuff tear were debrided sharply with a #15 blade and the exposed greater  tuberosity roughened with a rongeur. The tear was repaired using two Smith & Nephew 2.8 mm Q-Fix anchors. These sutures were then brought back laterally and secured using two Smith & Nephew Healicoil knotless RegeneSorb anchors to create a two-layer closure. An apparent watertight closure was obtained.  Given the quality of the torn rotator cuff tissue and the patient's age, it was felt best to reinforce this repair with a Claudene & Nephew Regeneten patch. Therefore, a medium sized patch was selected and applied over the repair site using the appropriate bone and soft tissue staples. This additional procedure added an extra measure of complexity to the case, as well as adding an additional 25 to 30 minutes of surgical time.  The wound was copiously irrigated with sterile saline solution before the deltoid raphae was reapproximated using 2-0 Vicryl interrupted sutures. The subcutaneous tissues were closed in two layers using 2-0 Vicryl interrupted sutures before the skin was closed using staples. The portal sites also were closed using staples. A sterile bulky dressing was applied to the shoulder before the arm was placed into a shoulder immobilizer.   As the drapes were removed and preparations were being made to move the patient back onto his stretcher before extubating him, the scrotum was inspected more carefully. The skin tear was noted to be curvilinear and approximately 3 to 4 cm in length. Therefore it was elected to primarily close the skin tear. The area was prepped out with a Betadine solution before the skin was reapproximated using 3-0 chromic catgut interrupted sutures. A sterile bulky dressing was applied to the wound before the patient was placed into some paper underwear to help hold the dressing in place. The patient was then awakened, extubated, and returned to the recovery room in satisfactory condition after tolerating  the procedure well.

## 2023-10-25 NOTE — Transfer of Care (Signed)
 Immediate Anesthesia Transfer of Care Note  Patient: Austin Sullivan  Procedure(s) Performed: ARTHROSCOPY, SHOULDER WITH DEBRIDEMENT (Right: Shoulder) DECOMPRESSION, SUBACROMIAL SPACE (Right: Shoulder) REPAIR, ROTATOR CUFF, OPEN (Right: Shoulder) TENODESIS, BICEPS (Right: Shoulder)  Patient Location: PACU  Anesthesia Type:General and Regional  Level of Consciousness: drowsy and patient cooperative  Airway & Oxygen Therapy: Patient Spontanous Breathing and Patient connected to nasal cannula oxygen  Post-op Assessment: Report given to RN and Post -op Vital signs reviewed and stable  Post vital signs: Reviewed and stable  Last Vitals:  Vitals Value Taken Time  BP 138/71 10/25/23 13:10  Temp    Pulse 76 10/25/23 13:10  Resp 15 10/25/23 13:10  SpO2 99 % 10/25/23 13:10  Vitals shown include unfiled device data.  Last Pain:  Vitals:   10/25/23 1310  TempSrc:   PainSc: 0-No pain         Complications: No notable events documented.

## 2023-10-25 NOTE — Discharge Instructions (Addendum)
 Orthopedic discharge instructions: Keep dressing dry and intact.  May shower after dressing changed on post-op day #4 (Monday).  Cover staples with Band-Aids after drying off. Apply ice frequently to shoulder. Take Aleve 1-2 tabs po BID with meals for 3-5 days, then as necessary. Take oxycodone  as prescribed when needed.  May supplement with ES Tylenol  if necessary. May resume daily baby aspirin tomorrow morning. Keep shoulder immobilizer on at all times except may remove for bathing purposes. Follow-up in 10-14 days or as scheduled.     Interscalene Nerve Block with Exparel    For your surgery you have received an Interscalene Nerve Block with Exparel . Nerve Blocks affect many types of nerves, including nerves that control movement, pain and normal sensation.  You may experience feelings such as numbness, tingling, heaviness, weakness or the inability to move your arm or the feeling or sensation that your arm has fallen asleep. A nerve block with Exparel  can last up to 5 days.  Usually the weakness wears off first.  The tingling and heaviness usually wear off next.  Finally you may start to notice pain.  Keep in mind that this may occur in any order.  Once a nerve block starts to wear off it is usually completely gone within 60 minutes. ISNB may cause mild shortness of breath, a hoarse voice, blurry vision, unequal pupils, or drooping of the face on the same side as the nerve block.  These symptoms will usually resolve with the numbness.  Very rarely the procedure itself can cause mild seizures. If needed, your surgeon will give you a prescription for pain medication.  It will take about 60 minutes for the oral pain medication to become fully effective.  So, it is recommended that you start taking this medication before the nerve block first begins to wear off, or when you first begin to feel discomfort. Take your pain medication only as prescribed.  Pain medication can cause sedation and  decrease your breathing if you take more than you need for the level of pain that you have. Nausea is a common side effect of many pain medications.  You may want to eat something before taking your pain medicine to prevent nausea. After an Interscalene nerve block, you cannot feel pain, pressure or extremes in temperature in the effected arm.  Because your arm is numb it is at an increased risk for injury.  To decrease the possibility of injury, please practice the following:  While you are awake change the position of your arm frequently to prevent too much pressure on any one area for prolonged periods of time.  If you have a cast or tight dressing, check the color or your fingers every couple of hours.  Call your surgeon with the appearance of any discoloration (white or blue). If you are given a sling to wear before you go home, please wear it  at all times until the block has completely worn off.  Do not get up at night without your sling. Please contact [facility] or your surgeon if you do not begin to regain sensation after 7 days from the surgery.  Anesthesia may be contacted by calling the Same Day Surgery Department, Mon. through Fri., 6 am to 4 pm at [phone].   If you experience any other problems or concerns, please contact your surgeon's office. If you experience severe or prolonged shortness of breath go to the nearest emergency department. SHOULDER SLING IMMOBILIZER   VIDEO Slingshot 2 Shoulder Brace Application -  YouTube ---[email or web address].porter.info/  INSTRUCTIONS While supporting the injured arm, slide the forearm into the sling. Wrap the adjustable shoulder strap around the neck and shoulders and attach the strap end to the sling using  the "alligator strap tab."  Adjust the shoulder strap to the required length. Position the shoulder pad behind the neck. To secure the shoulder pad location (optional), pull the shoulder strap away from the shoulder  pad, unfold the hook material on the top of the pad, then press the shoulder strap back onto the hook material to secure the pad in place. Attach the closure strap across the open top of the sling. Position the strap so that it holds the arm securely in the sling. Next, attach the thumb strap to the open end of the sling between the thumb and fingers. After sling has been fit, it may be easily removed and reapplied using the quick release buckle on shoulder strap. If a neutral pillow or 15 abduction pillow is included, place the pillow at the waistline. Attach the sling to the pillow, lining up hook material on the pillow with the loop on sling. Adjust the waist strap to fit.  If waist strap is too long, cut it to fit. Use the small piece of double sided hook material (located on top of the pillow) to secure the strap end. Place the double sided hook material on the inside of the cut strap end and secure it to the waist strap.     If no pillow is included, attach the waist strap to the sling and adjust to fit.    Washing Instructions: Straps and sling must be removed and cleaned regularly depending on your activity level and perspiration. Hand wash straps and sling in cold water with mild detergent, rinse, air dry

## 2023-10-26 ENCOUNTER — Encounter: Payer: Self-pay | Admitting: Surgery

## 2023-10-26 NOTE — Anesthesia Postprocedure Evaluation (Signed)
 Anesthesia Post Note  Patient: Austin Sullivan  Procedure(s) Performed: ARTHROSCOPY, SHOULDER WITH DEBRIDEMENT (Right: Shoulder) DECOMPRESSION, SUBACROMIAL SPACE (Right: Shoulder) REPAIR, ROTATOR CUFF, OPEN (Right: Shoulder) TENODESIS, BICEPS (Right: Shoulder)  Patient location during evaluation: PACU Anesthesia Type: Regional Level of consciousness: awake and alert, oriented and patient cooperative Pain management: pain level controlled Vital Signs Assessment: post-procedure vital signs reviewed and stable Respiratory status: spontaneous breathing, nonlabored ventilation and respiratory function stable Cardiovascular status: blood pressure returned to baseline and stable Postop Assessment: adequate PO intake Anesthetic complications: no   No notable events documented.   Last Vitals:  Vitals:   10/25/23 1400 10/25/23 1417  BP:  (!) 146/68  Pulse: 75 77  Resp: 15 16  Temp: (!) 36.2 C (!) 36.2 C  SpO2: 94% 94%    Last Pain:  Vitals:   10/25/23 1417  TempSrc: Temporal  PainSc: 0-No pain                 Willye Javier

## 2023-10-31 ENCOUNTER — Encounter: Payer: Self-pay | Admitting: Surgery

## 2024-01-29 ENCOUNTER — Other Ambulatory Visit: Payer: Self-pay | Admitting: Pulmonary Disease

## 2024-02-06 NOTE — Progress Notes (Signed)
 [facility]                    PT Daily Progress Note  Patient's Name:Bristol Ambrosino                        MRN #[id number]      Date:02/06/2024  Visit Number: 23  Progress Related to Long and Short Term Goals:                                                                                            [] AAROM:    [] PROM:     [] Strength:     [] Pain:     while- [] at rest    [] During activity:  []  Functional goal:    []  Other:    []  Balance:     Patient / Family Education:    []  Progressed HEP to include:  []  Reviewed previous HEP:    ___________________________________________________________________________________   Skilled Service Provided:  To: [x]  right   []  left     []  bilateral    []   core/body stability and balance  []  neck   [x]   shoulder   []  upper arm  []  elbow   []  lower arm  []  wrist   []  hand   []   fingers  []  back     []  hip    []  upper leg    []  knee  []  lower leg   []   ankle  foot            Ther Ex Therapeutic Procedure CPT 97110 : 35 minutes   Manual Tx Manual Therapy CPT 97140: 15 minutes   Vaso-pneumatic compression      Ice / Heat  Hot / Cold Modality  CPT 97010: Performed                     Infrared         US                 [x]  Inc ROM [x] Inc joint mob [] Dec swelling [x] Dec pain Setting:   [] Inc Circ  [x]  Inc Flex [x]  Inc Flex [] Dec inflam [x] Dec inflam [] Inc Healing [] Dec pain  []  Inc  Bal [x]  Inc ROM [] Dec edema [x] Dec swelling [] Dec pain [] Dec swelling  [x]  Inc Strength [] Astym Level      []  1        []  2      []  3 [] Inc circulation [] Inc circulation [] Dec inflam  Progressive  HEP          [x]   [] tension/ spasms/soft tissue mob Temp:         36 F [] Inc soft tissue mobility    [] Inc Posture [] Correct malaign       Subjective:    Pt reports R shoulder doing well.  Treatment notes:    Therapeutic ex as per f/s. Increase in resistance on lat pull downs, bicep curls, prone ext, prone rows and serratus punch. MT for  PROM followed by CP x 10' Assessment of Treatment:  Pt tolerated session well and demonstrates good ROM, Flexion mildly limited with tight end feel.  Patient's Response to Treatment:  [] Dec pain  [] Inc pain    []  Inc flexibility    [] Inc endurance   [x]  Inc strength     []  Inc AROM      []  Inc PROM       []  Dec Spasms    [] Inc Posture / alignment     []  Inc Joint Mobility   []  Other:   Functional Improvement Noted:  Increased ability to  []  walk   []  stand for longer periods   []  dress with less help    [x]  lift    [x]   reach   []   bend  []  Other:  Remaining Impairment Requiring Continued Treatment:   [x]  Dec  flexibility   [x]  Dec  ROM   [x]   pain   [x]  Dec strength  [x]   weakness    []   Dec balance   [x]   swelling   [x]   inflammation   []   Spasm    []  posture/alignment    []   Other:  Plan: [x]   Continue Plan of Care        []   Add:         []   Discharge after next visit    []   Discharge to HEP   []  Other: UE Treatment Log    Date  10/15 10/17 10/20  10/22 10/27 10/29  11/3 11/5  UBE          wall walk 10x 10x 10x 10x 10x x10 1# x10 1 10x  Pulley 2x5' 2x5' 2x5' 2x5' 2x5' 2x5' 2x5' 2x5'  Flexion          Scaption 1# 3x10 1 3x10 1 3x10 1# 3x10 1 3x10 1 3x10 2# 3x10 2 3x10  Abduction          T-Band IR GTB 3x10 GTB 3x10 Blue 3x10 Blue 3x10 Blue 3x10 Blue 3x10 Blue 3x12 Blue 3x12  T-Band ER at 0 abd GTB 3x10 GTB 3x10 Blue 3x10 Blue 3x10 Blue 3x10 Blue 3x10 Blue 3x12 Blue 3x12  Scap Retraction Blue 3x10 Blue 3x10 Blue 3x10 Blue 3x10 Blue 3x10 Blue 3x10 Blue 3x12 Blue 3x12  Shoulder Extension Blue 3x10 Blue 3x10 Blue 3x10 Blue 3x10 Blue 3x10 Blue 3x10 Blue 3x12 Blue 3x12  Lat Pulls 23 3x10 23 3x10 23# 3x10 23 3x10 27 3x10 27 3x10 27 3x12 30 3x10  F. M.  Rows          Tricep Ext          Bicep Curls 4# 3x10 5 3x10 5# 3x10 5# 3x10 5 3x10 5 3x10 5# 3x12 6 3x10  Shoulder Shrugs          wall pushups          S/L ER          Horiz Abd @ 90  prone/stand          Horiz Abd @ 120 prone/stand          Bentover Extension 4# 3x10 5 3x10 5# 3x10 5# 3x10 5 3x10 5 3x10 5# 3x12 6 3x10  Bentover Rows 4# 3x10 5 3x10 5# 3x10 5# 3x10 5 3x10 5 3x10 5# 3x12 6 3x10  Serratus Punch 4# 3x10 5 3x10 4# 3x10 5# 3x10 5 3x10 5 3x10 5# 3x12 6 3x10  PNFD2 Flex/Ext          HEP: Passive  right elbow flexion/extension and active forearm, wrist and hand all planes.  Patient will also perform active shoulder shrugs/rolls.  Patient will perform the passive and active exercises 3 times a day 25-30 repetitions each.          Supine wand flex/ER 2# 3x10 2 3x10 2# 3x10 3# 3x10 3 3x10 3 3x10 3# 3x10 3 3x10  Seated cane flexion                                        Manual-   right shld PROM all planes, GH post & inf glides, scapular rot, protraction & retraction  15' 15' 15' 15' 15' 15' 15' 15'  Ultrasound/ Infrared          Cold pack to right shoulder 10' 10' 10' 10' PD 10' 10 10'     PT Billing Documentation Date of Onset: 10/25/23 Visit Number: 23       Frequently Used Timed Codes Therapeutic Procedure CPT 97110 : 35 minutes Manual Therapy CPT 97140: 15 minutes        Hot / Cold Modality  CPT 97010: Performed       Total Treatment Time: 60 minutes  Attestation Statement:   I personally performed the service, incident to.  (I2)    HEIDI BARNES, PTA

## 2024-02-07 ENCOUNTER — Other Ambulatory Visit: Payer: Self-pay

## 2024-02-07 ENCOUNTER — Encounter
Admission: RE | Admit: 2024-02-07 | Discharge: 2024-02-07 | Disposition: A | Discharge status: Home / Self Care | Source: Ambulatory Visit | Attending: Pulmonary Disease | Admitting: Pulmonary Disease

## 2024-02-07 VITALS — Wt 261.0 lb

## 2024-02-07 DIAGNOSIS — J441 Chronic obstructive pulmonary disease with (acute) exacerbation: Secondary | ICD-10-CM

## 2024-02-07 DIAGNOSIS — Z01818 Encounter for other preprocedural examination: Secondary | ICD-10-CM

## 2024-02-07 DIAGNOSIS — E119 Type 2 diabetes mellitus without complications: Secondary | ICD-10-CM

## 2024-02-07 DIAGNOSIS — I10 Essential (primary) hypertension: Secondary | ICD-10-CM

## 2024-02-07 DIAGNOSIS — R590 Localized enlarged lymph nodes: Secondary | ICD-10-CM

## 2024-02-07 DIAGNOSIS — Z79899 Other long term (current) drug therapy: Secondary | ICD-10-CM

## 2024-02-07 DIAGNOSIS — Z01812 Encounter for preprocedural laboratory examination: Secondary | ICD-10-CM

## 2024-02-07 HISTORY — DX: Type 2 diabetes mellitus without complications: E11.9

## 2024-02-07 HISTORY — DX: Localized enlarged lymph nodes: R59.0

## 2024-02-07 HISTORY — DX: Bronchiectasis, uncomplicated: J47.9

## 2024-02-07 NOTE — Patient Instructions (Addendum)
 Your procedure is scheduled on:02-15-24 Friday Report to the Registration Desk on the 1st floor of the [facility].Then proceed to the 2nd floor Surgery Desk To find out your arrival time, please call [phone] between 1PM - 3PM on:02-14-24 Thursday If your arrival time is 6:00 am, do not arrive before that time as the [facility] entrance doors do not open until 6:00 am.  REMEMBER: Instructions that are not followed completely may result in serious medical risk, up to and including death; or upon the discretion of your surgeon and anesthesiologist your surgery may need to be rescheduled.  Do not eat food OR drink liquids after midnight the night before surgery.  No gum chewing or hard candies.  One week prior to surgery:Stop NOW (02-07-24) Stop Anti-inflammatories (NSAIDS) such as Advil, Aleve, Ibuprofen, Motrin, Naproxen, Naprosyn and Aspirin based products such as Excedrin, Goody's Powder, BC Powder. Stop ANY OVER THE COUNTER supplements until after surgery (Co Q10)  You may however, continue to take Tylenol /Oxycodone  if needed for pain up until the day of surgery.  Stop your 81 mg Aspirin 7 days prior to surgery as instructed by your surgeon-Last dose was on 02-06-24 Wednesday  Stop metFORMIN  (GLUCOPHAGE ) 2 days prior to surgery-Last dose will be on 02-12-24 Tuesday  Continue taking all of your other prescription medications up until the day of surgery.  ON THE DAY OF SURGERY ONLY TAKE THESE MEDICATIONS WITH SIPS OF WATER: -dutasteride  (AVODART )  -fexofenadine (ALLEGRA)  -gabapentin  (NEURONTIN )  -metoprolol  succinate (TOPROL -XL)  -pregabalin  (LYRICA )   Use your Albuterol  and Breo Ellipta  Inhaler the day of surgery and bring your Albuterol  Inhaler to the hospital   No Alcohol for 24 hours before or after surgery.  No Smoking including e-cigarettes for 24 hours before surgery.  No chewable tobacco products for at least 6 hours before surgery.  No nicotine patches on the day  of surgery.  Do not use any recreational drugs for at least a week (preferably 2 weeks) before your surgery.  Please be advised that the combination of cocaine and anesthesia may have negative outcomes, up to and including death. If you test positive for cocaine, your surgery will be cancelled.  On the morning of surgery brush your teeth with toothpaste and water, you may rinse your mouth with mouthwash if you wish. Do not swallow any toothpaste or mouthwash.  Do not wear jewelry, make-up, hairpins, clips or nail polish.  For welded (permanent) jewelry: bracelets, anklets, waist bands, etc.  Please have this removed prior to surgery.  If it is not removed, there is a chance that hospital personnel will need to cut it off on the day of surgery.  Do not wear lotions, powders, or perfumes.   Do not shave body hair from the neck down 48 hours before surgery.  Contact lenses, hearing aids and dentures may not be worn into surgery.  Do not bring valuables to the hospital. [facility] is not responsible for any missing/lost belongings or valuables.   Bring your C-PAP to the hospital  Notify your doctor if there is any change in your medical condition (cold, fever, infection).  Wear comfortable clothing (specific to your surgery type) to the hospital.  After surgery, you can help prevent lung complications by doing breathing exercises.  Take deep breaths and cough every 1-2 hours. Your doctor may order a device called an Incentive Spirometer to help you take deep breaths. When coughing or sneezing, hold a pillow firmly against your incision with both  hands. This is called "splinting." Doing this helps protect your incision. It also decreases belly discomfort.  If you are being admitted to the hospital overnight, leave your suitcase in the car. After surgery it may be brought to your room.  In case of increased patient census, it may be necessary for you, the patient, to continue your  postoperative care in the Same Day Surgery department.  If you are being discharged the day of surgery, you will not be allowed to drive home. You will need a responsible individual to drive you home and stay with you for 24 hours after surgery.   If you are taking public transportation, you will need to have a responsible individual with you.  Please call the Pre-admissions Testing Dept. at [phone] if you have any questions about these instructions.  Surgery Visitation Policy:  Patients having surgery or a procedure may have two visitors.  Children under the age of 31 must have an adult with them who is not the patient.   Merchandiser, Retail to address health-related social needs:  [email or web address]

## 2024-02-12 ENCOUNTER — Encounter
Admission: RE | Admit: 2024-02-12 | Discharge: 2024-02-12 | Disposition: A | Discharge status: Home / Self Care | Source: Ambulatory Visit | Attending: Pulmonary Disease | Admitting: Pulmonary Disease

## 2024-02-12 DIAGNOSIS — Z01812 Encounter for preprocedural laboratory examination: Secondary | ICD-10-CM | POA: Diagnosis present

## 2024-02-12 DIAGNOSIS — E119 Type 2 diabetes mellitus without complications: Secondary | ICD-10-CM | POA: Insufficient documentation

## 2024-02-12 DIAGNOSIS — Z79899 Other long term (current) drug therapy: Secondary | ICD-10-CM | POA: Insufficient documentation

## 2024-02-12 DIAGNOSIS — I10 Essential (primary) hypertension: Secondary | ICD-10-CM | POA: Diagnosis not present

## 2024-02-12 DIAGNOSIS — J441 Chronic obstructive pulmonary disease with (acute) exacerbation: Secondary | ICD-10-CM | POA: Diagnosis not present

## 2024-02-12 LAB — BASIC METABOLIC PANEL WITH GFR
Anion gap: 14 (ref 5–15)
BUN: 18 mg/dL (ref 8–23)
CO2: 23 mmol/L (ref 22–32)
Calcium: 9.5 mg/dL (ref 8.9–10.3)
Chloride: 107 mmol/L (ref 98–111)
Creatinine, Ser: 0.94 mg/dL (ref 0.61–1.24)
GFR, Estimated: >60 mL/min (ref >60)
Glucose, Bld: 220 mg/dL — ABNORMAL HIGH (ref 70–99)
Potassium: 4.4 mmol/L (ref 3.5–5.1)
Sodium: 143 mmol/L (ref 135–145)

## 2024-02-12 LAB — CBC
HCT: 42 % (ref 39.0–52.0)
Hemoglobin: 13.2 g/dL (ref 13.0–17.0)
MCH: 26.2 pg (ref 26.0–34.0)
MCHC: 31.4 g/dL (ref 30.0–36.0)
MCV: 83.5 fL (ref 80.0–100.0)
Platelets: 217 K/uL (ref 150–400)
RBC: 5.03 MIL/uL (ref 4.22–5.81)
RDW: 17.9 % — ABNORMAL HIGH (ref 11.5–15.5)
WBC: 6.6 K/uL (ref 4.0–10.5)
nRBC: 0 % (ref 0.0–0.2)

## 2024-02-15 ENCOUNTER — Other Ambulatory Visit: Payer: Self-pay

## 2024-02-15 ENCOUNTER — Ambulatory Visit

## 2024-02-15 ENCOUNTER — Ambulatory Visit
Admission: RE | Admit: 2024-02-15 | Discharge: 2024-02-15 | Disposition: A | Discharge status: Home / Self Care | Source: Ambulatory Visit | Attending: Pulmonary Disease | Admitting: Pulmonary Disease

## 2024-02-15 ENCOUNTER — Encounter: Payer: Self-pay | Admitting: Pulmonary Disease

## 2024-02-15 ENCOUNTER — Encounter
Admission: RE | Disposition: A | Discharge status: Home / Self Care | Payer: Self-pay | Source: Ambulatory Visit | Attending: Pulmonary Disease

## 2024-02-15 ENCOUNTER — Ambulatory Visit: Payer: Self-pay | Admitting: Urgent Care

## 2024-02-15 DIAGNOSIS — R911 Solitary pulmonary nodule: Secondary | ICD-10-CM | POA: Diagnosis not present

## 2024-02-15 DIAGNOSIS — T17500A Unspecified foreign body in bronchus causing asphyxiation, initial encounter: Secondary | ICD-10-CM | POA: Insufficient documentation

## 2024-02-15 DIAGNOSIS — G4733 Obstructive sleep apnea (adult) (pediatric): Secondary | ICD-10-CM | POA: Insufficient documentation

## 2024-02-15 DIAGNOSIS — J4489 Other specified chronic obstructive pulmonary disease: Secondary | ICD-10-CM | POA: Diagnosis not present

## 2024-02-15 DIAGNOSIS — E1142 Type 2 diabetes mellitus with diabetic polyneuropathy: Secondary | ICD-10-CM | POA: Insufficient documentation

## 2024-02-15 DIAGNOSIS — I10 Essential (primary) hypertension: Secondary | ICD-10-CM | POA: Diagnosis not present

## 2024-02-15 DIAGNOSIS — Z01818 Encounter for other preprocedural examination: Secondary | ICD-10-CM

## 2024-02-15 DIAGNOSIS — X58XXXA Exposure to other specified factors, initial encounter: Secondary | ICD-10-CM | POA: Diagnosis not present

## 2024-02-15 DIAGNOSIS — Z87891 Personal history of nicotine dependence: Secondary | ICD-10-CM | POA: Diagnosis not present

## 2024-02-15 DIAGNOSIS — R590 Localized enlarged lymph nodes: Secondary | ICD-10-CM | POA: Diagnosis present

## 2024-02-15 HISTORY — PX: VIDEO BRONCHOSCOPY WITH ENDOBRONCHIAL ULTRASOUND: SHX6177

## 2024-02-15 HISTORY — PX: FLEXIBLE BRONCHOSCOPY: SHX5094

## 2024-02-15 LAB — GLUCOSE, CAPILLARY
Glucose-Capillary: 178 mg/dL — ABNORMAL HIGH (ref 70–99)
Glucose-Capillary: 193 mg/dL — ABNORMAL HIGH (ref 70–99)

## 2024-02-15 SURGERY — BRONCHOSCOPY, FLEXIBLE
Anesthesia: General

## 2024-02-15 MED ORDER — LIDOCAINE HCL (PF) 2 % IJ SOLN
INTRAMUSCULAR | Status: AC
  Filled 2024-02-15: qty 5

## 2024-02-15 MED ORDER — PHENYLEPHRINE 80 MCG/ML (10ML) SYRINGE FOR IV PUSH (FOR BLOOD PRESSURE SUPPORT)
PREFILLED_SYRINGE | INTRAVENOUS | Status: AC
  Filled 2024-02-15: qty 40

## 2024-02-15 MED ORDER — FENTANYL CITRATE (PF) 100 MCG/2ML IJ SOLN
INTRAMUSCULAR | Status: DC | PRN
  Administered 2024-02-15: 50 ug via INTRAVENOUS

## 2024-02-15 MED ORDER — ONDANSETRON HCL 4 MG/2ML IJ SOLN
INTRAMUSCULAR | Status: AC
  Filled 2024-02-15: qty 2

## 2024-02-15 MED ORDER — INSULIN ASPART 100 UNIT/ML IJ SOLN
4.0000 [IU] | Freq: Once | INTRAMUSCULAR | Status: AC
  Administered 2024-02-15: 4 [IU] via SUBCUTANEOUS

## 2024-02-15 MED ORDER — ORAL CARE MOUTH RINSE: 15.0000 mL | Freq: Once | OROMUCOSAL | Status: AC

## 2024-02-15 MED ORDER — PROPOFOL 10 MG/ML IV BOLUS
INTRAVENOUS | Status: DC | PRN
  Administered 2024-02-15: 130 ug/kg/min via INTRAVENOUS
  Administered 2024-02-15: 150 mg via INTRAVENOUS
  Administered 2024-02-15: 50 mg via INTRAVENOUS

## 2024-02-15 MED ORDER — FENTANYL CITRATE (PF) 100 MCG/2ML IJ SOLN
INTRAMUSCULAR | Status: AC
  Filled 2024-02-15: qty 2

## 2024-02-15 MED ORDER — ROCURONIUM BROMIDE 10 MG/ML (PF) SYRINGE
PREFILLED_SYRINGE | INTRAVENOUS | Status: DC | PRN
Start: 2024-02-15 — End: 2024-02-15
  Administered 2024-02-15: 60 mg via INTRAVENOUS
  Administered 2024-02-15 (×2): 20 mg via INTRAVENOUS

## 2024-02-15 MED ORDER — LIDOCAINE HCL (CARDIAC) PF 100 MG/5ML IV SOSY
PREFILLED_SYRINGE | INTRAVENOUS | Status: DC | PRN
  Administered 2024-02-15: 50 mg via INTRAVENOUS
  Administered 2024-02-15: 100 mg via INTRAVENOUS

## 2024-02-15 MED ORDER — PROPOFOL 10 MG/ML IV BOLUS
INTRAVENOUS | Status: AC
  Filled 2024-02-15: qty 20

## 2024-02-15 MED ORDER — ROCURONIUM BROMIDE 10 MG/ML (PF) SYRINGE
PREFILLED_SYRINGE | INTRAVENOUS | Status: AC
  Filled 2024-02-15: qty 10

## 2024-02-15 MED ORDER — EPHEDRINE SULFATE-NACL 50-0.9 MG/10ML-% IV SOSY
PREFILLED_SYRINGE | INTRAVENOUS | Status: DC | PRN
  Administered 2024-02-15 (×3): 5 mg via INTRAVENOUS

## 2024-02-15 MED ORDER — ROCURONIUM BROMIDE 10 MG/ML (PF) SYRINGE
PREFILLED_SYRINGE | INTRAVENOUS | Status: AC
  Filled 2024-02-15: qty 30

## 2024-02-15 MED ORDER — MIDAZOLAM HCL 2 MG/2ML IJ SOLN
INTRAMUSCULAR | Status: AC
Start: 2024-02-15 — End: 2024-02-15
  Filled 2024-02-15: qty 2

## 2024-02-15 MED ORDER — PROPOFOL 1000 MG/100ML IV EMUL
INTRAVENOUS | Status: AC
  Filled 2024-02-15: qty 100

## 2024-02-15 MED ORDER — CHLORHEXIDINE GLUCONATE 0.12 % MT SOLN
15.0000 mL | Freq: Once | OROMUCOSAL | Status: AC
  Administered 2024-02-15: 15 mL via OROMUCOSAL

## 2024-02-15 MED ORDER — SODIUM CHLORIDE 0.9 % IV SOLN: INTRAVENOUS | Status: DC

## 2024-02-15 MED ORDER — CHLORHEXIDINE GLUCONATE 0.12 % MT SOLN
OROMUCOSAL | Status: AC
  Filled 2024-02-15: qty 15

## 2024-02-15 MED ORDER — ONDANSETRON HCL 4 MG/2ML IJ SOLN
INTRAMUSCULAR | Status: DC | PRN
  Administered 2024-02-15: 4 mg via INTRAVENOUS

## 2024-02-15 MED ORDER — PHENYLEPHRINE 80 MCG/ML (10ML) SYRINGE FOR IV PUSH (FOR BLOOD PRESSURE SUPPORT)
PREFILLED_SYRINGE | INTRAVENOUS | Status: DC | PRN
Start: 2024-02-15 — End: 2024-02-15
  Administered 2024-02-15: 80 ug via INTRAVENOUS
  Administered 2024-02-15: 160 ug via INTRAVENOUS
  Administered 2024-02-15 (×2): 80 ug via INTRAVENOUS
  Administered 2024-02-15 (×2): 160 ug via INTRAVENOUS

## 2024-02-15 MED ORDER — MIDAZOLAM HCL (PF) 2 MG/2ML IJ SOLN
INTRAMUSCULAR | Status: DC | PRN
  Administered 2024-02-15: 2 mg via INTRAVENOUS

## 2024-02-15 MED ORDER — EPHEDRINE 5 MG/ML INJ
INTRAVENOUS | Status: AC
  Filled 2024-02-15: qty 5

## 2024-02-15 MED ORDER — INSULIN ASPART 100 UNIT/ML IJ SOLN
INTRAMUSCULAR | Status: AC
Start: 2024-02-15 — End: 2024-02-15
  Filled 2024-02-15: qty 4

## 2024-02-15 NOTE — Anesthesia Procedure Notes (Cosign Needed)
 Procedure Name: Intubation Date/Time: 02/15/2024 7:40 AM  Performed by: Vicci Camellia Glatter, MDPre-anesthesia Checklist: Patient identified, Emergency Drugs available, Suction available and Patient being monitored Patient Re-evaluated:Patient Re-evaluated prior to induction Oxygen Delivery Method: Circle system utilized Preoxygenation: Pre-oxygenation with 100% oxygen Induction Type: IV induction Ventilation: Mask ventilation without difficulty and Two handed mask ventilation required Laryngoscope Size: McGrath and 4 Grade View: Grade I Tube type: Oral Tube size: 8.5 mm Number of attempts: 1 Airway Equipment and Method: Stylet and Oral airway Placement Confirmation: ETT inserted through vocal cords under direct vision, positive ETCO2 and breath sounds checked- equal and bilateral Secured at: 23 cm Tube secured with: Tape Dental Injury: Teeth and Oropharynx as per pre-operative assessment

## 2024-02-15 NOTE — Anesthesia Postprocedure Evaluation (Signed)
 Anesthesia Post Note  Patient: Austin Sullivan  Procedure(s) Performed: BRONCHOSCOPY, FLEXIBLE BRONCHOSCOPY, WITH EBUS  Patient location during evaluation: PACU Anesthesia Type: General Level of consciousness: awake and alert Pain management: pain level controlled Vital Signs Assessment: post-procedure vital signs reviewed and stable Respiratory status: spontaneous breathing, nonlabored ventilation and respiratory function stable Cardiovascular status: blood pressure returned to baseline and stable Postop Assessment: no apparent nausea or vomiting Anesthetic complications: no   No notable events documented.   Last Vitals:  Vitals:   02/15/24 0945 02/15/24 0954  BP: 117/66 125/78  Pulse: 80 86  Resp: 17 16  Temp:  (!) 36.3 C  SpO2: 98% 94%    Last Pain:  Vitals:   02/15/24 0954  TempSrc: Temporal  PainSc: 0-No pain                 [name]

## 2024-02-15 NOTE — Transfer of Care (Signed)
 Immediate Anesthesia Transfer of Care Note  Patient: Austin Sullivan  Procedure(s) Performed: BRONCHOSCOPY, FLEXIBLE BRONCHOSCOPY, WITH EBUS  Patient Location: PACU  Anesthesia Type:General  Level of Consciousness: drowsy and patient cooperative  Airway & Oxygen Therapy: Patient Spontanous Breathing and Patient connected to face mask oxygen  Post-op Assessment: Report given to RN, Post -op Vital signs reviewed and stable, and Patient moving all extremities X 4  Post vital signs: Reviewed and stable  Last Vitals:  Vitals Value Taken Time  BP 119/59 02/15/24 09:15  Temp    Pulse 81 02/15/24 09:16  Resp 15 02/15/24 09:16  SpO2 100 % 02/15/24 09:16  Vitals shown include unfiled device data.  Last Pain:  Vitals:   02/15/24 0649  TempSrc: Temporal  PainSc: 0-No pain         Complications: No notable events documented.

## 2024-02-15 NOTE — Anesthesia Preprocedure Evaluation (Addendum)
 Anesthesia Evaluation  Patient identified by MRN, date of birth, ID band Patient awake    Reviewed: Allergy & Precautions, H&P , NPO status , Patient's Chart, lab work & pertinent test results  Airway Mallampati: II  TM Distance: >3 FB Neck ROM: full    Dental  (+) Poor Dentition   Pulmonary shortness of breath and with exertion, asthma , sleep apnea , COPD, former smoker Enlarged mediastinal lymph nodes, possible malignancy Lymph nodes enlarged to 3 cm, previously smaller.  Radiation fibrosis of lung   Hx tracheostomy, laryngeal CA   Pulmonary exam normal        Cardiovascular Exercise Tolerance: Poor hypertension, Normal cardiovascular exam     Neuro/Psych  Neuromuscular disease (PN)  negative psych ROS   GI/Hepatic negative GI ROS, Neg liver ROS,,,  Endo/Other  diabetes, Type 2    Renal/GU      Musculoskeletal   Abdominal Normal abdominal exam  (+)   Peds  Hematology negative hematology ROS (+)   Anesthesia Other Findings Past Medical History: No date: Arthritis No date: Asthma 01/30/2019: Basal cell carcinoma     Comment:  Left ear, superior helix. Nodular and infiltrative               patterns. Simple excision/EDC. 11/03/2019: Basal cell carcinoma     Comment:  left lat brow 06/30/2021: Basal cell carcinoma     Comment:  left forehead above lat brow, schedule Mohs 01/05/2022: Basal cell carcinoma     Comment:  left superior helix, EDC No date: BPH (benign prostatic hyperplasia) No date: Bronchiectasis (HCC) No date: COPD (chronic obstructive pulmonary disease) (HCC) No date: DM (diabetes mellitus), type 2 (HCC) No date: Dyspnea No date: Dyspnea on exertion No date: Hilar adenopathy No date: History of kidney stones No date: HLD (hyperlipidemia) No date: Hodgkin's lymphoma (HCC)     Comment:  chemotherapy. now in remission No date: Hypertension No date: Irritable bowel syndrome with  constipation 2001: Melanoma (HCC)     Comment:  Right forehead. Metastatic melanoma 2016 No date: Obesity No date: OSA on CPAP No date: Peripheral neuropathy 2025: Primary osteoarthritis of right shoulder 2025: Rotator cuff tendinitis, right 07/2020: Sepsis (HCC)     Comment:  developed stones after TKR and was readmit to hospital               for this No date: Vocal cord paralysis     Comment:  wedge placed on cords so he can speak  Past Surgical History: 10/25/2023: BICEPT TENODESIS; Right     Comment:  Procedure: TENODESIS, BICEPS;  Surgeon: Edie Norleen PARAS,               MD;  Location: [facility];  Service: Orthopedics;                Laterality: Right; No date: bph No date: COLONOSCOPY 10/03/2022: COLONOSCOPY WITH PROPOFOL ; N/A     Comment:  Procedure: COLONOSCOPY WITH PROPOFOL ;  Surgeon:               Maryruth Ole DASEN, MD;  Location: [facility];                Service: Endoscopy;  Laterality: N/A; 07/26/2020: CYSTOSCOPY WITH STENT PLACEMENT; Bilateral     Comment:  Procedure: CYSTOSCOPY WITH STENT PLACEMENT;  Surgeon:               Francisca Redell BROCKS, MD;  Location: [facility];  Service:  Urology;  Laterality: Bilateral; 08/27/2020: CYSTOSCOPY/URETEROSCOPY/HOLMIUM LASER/STENT PLACEMENT;  Bilateral     Comment:  Procedure: CYSTOSCOPY/URETEROSCOPY/HOLMIUM LASER/STENT               PLACEMENT;  Surgeon: Francisca Redell BROCKS, MD;  Location:               [facility];  Service: Urology;  Laterality: Bilateral; No date: EYE SURGERY     Comment:  carterat surgery x 2 No date: FRACTURE SURGERY     Comment:  both wrist ahs metal plates No date: JOINT REPLACEMENT     Comment:  left hip, LEFT  KNEE No date: nasal cavity surgery     Comment:  septoplasty No date: ORCHIECTOMY     Comment:  only 1 testicle removed d/t lump on that side 2017: port a cath place 10/25/2023: POSTERIOR LUMBAR FUSION 2 WITH HARDWARE REMOVAL; Right     Comment:  Procedure: ARTHROSCOPY, SHOULDER  WITH DEBRIDEMENT;                Surgeon: Edie Norleen PARAS, MD;  Location: [facility];                Service: Orthopedics;  Laterality: Right; 2022: removed port a cath 10/25/2023: SHOULDER OPEN ROTATOR CUFF REPAIR; Right     Comment:  Procedure: REPAIR, ROTATOR CUFF, OPEN;  Surgeon: Edie Norleen PARAS, MD;  Location: [facility];  Service: Orthopedics;                Laterality: Right; 10/25/2023: SUBACROMIAL DECOMPRESSION; Right     Comment:  Procedure: DECOMPRESSION, SUBACROMIAL SPACE;  Surgeon:               Edie Norleen PARAS, MD;  Location: [facility];  Service:               Orthopedics;  Laterality: Right; No date: TOTAL HIP ARTHROPLASTY; Left 07/20/2020: TOTAL KNEE ARTHROPLASTY; Left     Comment:  Procedure: TOTAL KNEE ARTHROPLASTY - [place] Amber to               Assist;  Surgeon: Kathlynn Sharper, MD;  Location: [facility];              Service: Orthopedics;  Laterality: Left; 11/01/2021: TOTAL KNEE REVISION; Left     Comment:  Procedure: Left knee revision, polyethylene exchange;                Surgeon: Kathlynn Sharper, MD;  Location: [facility];                Service: Orthopedics;  Laterality: Left; 1999: TRACHEOSTOMY 2017: vocal cord spacer No date: WRIST SURGERY; Bilateral     Comment:  PLATES AND SCREWS  BMI    Body Mass Index: 33.51 kg/m      Reproductive/Obstetrics negative OB ROS                              Anesthesia Physical Anesthesia Plan  ASA: 3  Anesthesia Plan: General ETT and General   Post-op Pain Management: Minimal or no pain anticipated   Induction: Intravenous  PONV Risk Score and Plan: 2 and Ondansetron  and Dexamethasone   Airway Management Planned: Oral ETT  Additional Equipment:   Intra-op Plan:   Post-operative Plan: Extubation in OR  Informed Consent: I have reviewed the patients History and Physical, chart, labs  and discussed the procedure including the risks, benefits and alternatives for the proposed anesthesia  with the patient or authorized representative who has indicated his/her understanding and acceptance.     Dental Advisory Given  Plan Discussed with: CRNA and Surgeon  Anesthesia Plan Comments:          Anesthesia Quick Evaluation

## 2024-02-15 NOTE — Procedures (Signed)
 FIBEROPTIC BRONCHOSCOPY WITH BRONCHOALVEOLAR LAVAGE PROCEDURE NOTE 31624  THERAPEUTIC ASPIRATION OF TRACHEOBRONCHIAL TREE 31645  ENDOBRONCHIAL ULTRASOUND X >/3  LYMPH NODE PROCEDURE NOTE  31653  TRANSBRONCHIAL FNA X 1 LOBE 31629  TRANSBRONCHIAL SURGICAL LUNG BIOPSY X 1 LOBE 31628  TRANSBRONCHIAL CYTOPATHOLOGY BRUSHINGS 68376    Flexible bronchoscopy was performed  by : Parris MD  assistance by : 1)Repiratory therapist  and 2)cytotech staff and 3) Anesthesia team and 4) Flouroscopy team and 5) IT supporting staff for robotic platform   Indication for the procedure was :  Pre-procedural H&P. The following assessment was performed on the day of the procedure prior to initiating sedation History:  Chest pain n Dyspnea y Hemoptysis n Cough y Fever n Other pertinent items n  Examination Vital signs -reviewed as per nursing documentation today Cardiac    Murmurs: n  Rubs : n  Gallop: n Lungs Wheezing: n Rales : n Rhonchi :y  Other pertinent findings: SOB/hypoxemia due to chronic lung disease   Pre-procedural assessment for Procedural Sedation included: Depth of sedation: As per anesthesia team  ASA Classification:  2 Mallampati airway assessment: 3    Medication list reviewed: y  The patient's interval history was taken and revealed: no new complaints The pre- procedure physical examination revealed: No new findings Refer to prior clinic note for details.  Informed Consent: Informed consent was obtained from:  patient after explanation of procedure and risks, benefits, as well as alternative procedures available.  Explanation of level of sedation and possible transfusion was also provided.    Procedural Preparation: Time out was performed and patient was identified by name and birthdate and procedure to be performed and side for sampling, if any, was specified. Pt was intubated by anesthesia.  The patient was appropriately draped.   Fiberoptic  bronchoscopy with airway inspection, therapeutic aspiration of tracheobronchial tree and BAL Procedure findings:  Bronchoscope was inserted via ETT  without difficulty.  Posterior oropharynx, epiglottis, arytenoids, false cords and vocal cords were not visualized as these were bypassed by endotracheal tube. The distal trachea was normal in circumference and appearance without mucosal, cartilaginous or branching abnormalities.  The main carina was mildly splayed . All right and left lobar airways were NOT visualized to the Subsegmental level due to obstructed airways from phlegm and mucus  plugging. Mucus plugging with partial to complete airway obstruction was noted bilaterally and treated with therapeutic aspiration prior to beginning of robotic bronchoscopy to allow adequate visualization and accurate lung biopsy and improve patients respiratory status.   The mucosa was : friable at target segment  Airways were notable for:        exophytic lesions :n       extrinsic compression in the following distributions: n.       Friable mucosa: y       Teacher, music /pigmentation: n      Media Information  Hyperpigmented well circumscribed mural lesion of RBI  Media Information  Hyperpigmented well circumscribed mural lesion of RBI     Post procedure Diagnosis:   Mucus plugging of tracheobronchial tree and incidentally found mural lesion of RBI     Target 1 RBI lesion - Transbronchial cytobrushing x 2 ,   Transbronchial FNA x 7,   Transbronchial surgical pathology x 10,    BAL was performed at this location and sent for processing withand microbiology  Due to insufficient tissue via FNA a surgical lung biopsy via surgical forceps was required for adequate pathology tissue evaluation  Post procedure diagnosis:  Mural lesion of RBI and mucus pluggin      Endobronchial ultrasound assisted hilar and mediastinal lymph node biopsies procedure findings: The fiberoptic bronchoscope  was removed and the EBUS scope was introduced. Examination began to evaluate for pathologically enlarged lymph nodes starting on the left  side progressing to the right side.  All lymph node biopsies performed with 21g needle. Lymph node biopsies were sent in cytolite for all stations.  Station 10L - 1.1 cm biopsied 3 times +RPMI Station 7 - 1.2cm - biopsied 3 times Station 4R - 2.2cm biopsied 4 times    Post procedure diagnosis:  hilar and mediastinal adenopathy     Immediate sampling complications included:NONE immediate  Epinephrine  ZERO ml was used topically  The bronchoscopy was terminated due to completion of the planned procedure and the bronchoscope was removed.   Total dosage of Lidocaine  was ZERO mg  Estimated Blood loss: EXPECTED < 5cc.  Complications included:  NONE   Preliminary CXR findings :  IN PROCESS  Disposition: HOME WITH FAMILY   Follow up with Dr. Ritesh Opara in 5 days for result discussion.     Halina Picking MD  [facility] Division of Pulmonary & Critical Care Medicine

## 2024-02-15 NOTE — H&P (Signed)
 PULMONOLOGY         Date: 02/15/2024,   MRN# [id number] Austin Sullivan April 05, 1955     AdmissionWeight: 118.4 kg                 CurrentWeight: 118.4 kg    CHIEF COMPLAINT:   Mucus plugging of tracheobronchial tree and mediastinal lymphadenopathy   HISTORY OF PRESENT ILLNESS   This is a pleasant 68 year old male with a history of basal cell carcinoma and melanoma asthma, bronchiectasis with mucous plugging of tracheobronchial tree recent CT chest with bulky paratracheal and mediastinal lymphadenopathy.  Patient was seen in clinic with complaints of dyspnea and difficulty with expectoration of mucus to clear his airways.  We reviewed CT chest as noted below and made plan to perform bronchoscopic evaluation with airway inspection, therapeutic aspiration of tracheobronchial tree as well as bronchoalveolar lavage and endobronchial ultrasound assisted lymph node biopsies due to paratracheal and mediastinal lymphadenopathy as largest 3 cm in size.  This findings may be suggestive of ongoing malignancy versus infectious etiology versus inflammatory conditions such as sarcoidosis.  Bronchoscopy will be both therapeutic and diagnostic.  Patient seen at bedside with no new complaints or findings. Reviewed risks/complications and benefits with patient, risks include infection, pneumothorax/pneumomediastinum which may require chest tube placement as well as overnight/prolonged hospitalization and possible mechanical ventilation. Other risks include bleeding and very rarely death.  Patient understands risks and wishes to proceed.  Additional questions were answered, and patient is aware that post procedure patient will be going home with family and may experience cough with possible clots on expectoration as well as phlegm which may last few days as well as hoarseness of voice post intubation and mechanical ventilation.    PAST MEDICAL HISTORY   Past Medical History:  Diagnosis Date    Arthritis    Asthma    Basal cell carcinoma 01/30/2019   Left ear, superior helix. Nodular and infiltrative patterns. Simple excision/EDC.   Basal cell carcinoma 11/03/2019   left lat brow   Basal cell carcinoma 06/30/2021   left forehead above lat brow, schedule Mohs   Basal cell carcinoma 01/05/2022   left superior helix, EDC   BPH (benign prostatic hyperplasia)    Bronchiectasis (HCC)    COPD (chronic obstructive pulmonary disease) (HCC)    DM (diabetes mellitus), type 2 (HCC)    Dyspnea    Dyspnea on exertion    Hilar adenopathy    History of kidney stones    HLD (hyperlipidemia)    Hodgkin's lymphoma (HCC)    chemotherapy. now in remission   Hypertension    Irritable bowel syndrome with constipation    Melanoma (HCC) 2001   Right forehead. Metastatic melanoma 2016   Obesity    OSA on CPAP    Peripheral neuropathy    Primary osteoarthritis of right shoulder 2025   Rotator cuff tendinitis, right 2025   Sepsis (HCC) 07/2020   developed stones after TKR and was readmit to hospital for this   Vocal cord paralysis    wedge placed on cords so he can speak     SURGICAL HISTORY   Past Surgical History:  Procedure Laterality Date   BICEPT TENODESIS Right 10/25/2023   Procedure: TENODESIS, BICEPS;  Surgeon: Edie Norleen PARAS, MD;  Location: [facility];  Service: Orthopedics;  Laterality: Right;   bph     COLONOSCOPY     COLONOSCOPY WITH PROPOFOL  N/A 10/03/2022   Procedure: COLONOSCOPY WITH PROPOFOL ;  Surgeon: Maryruth Smalls  T, MD;  Location: [facility];  Service: Endoscopy;  Laterality: N/A;   CYSTOSCOPY WITH STENT PLACEMENT Bilateral 07/26/2020   Procedure: CYSTOSCOPY WITH STENT PLACEMENT;  Surgeon: Francisca Redell BROCKS, MD;  Location: [facility];  Service: Urology;  Laterality: Bilateral;   CYSTOSCOPY/URETEROSCOPY/HOLMIUM LASER/STENT PLACEMENT Bilateral 08/27/2020   Procedure: CYSTOSCOPY/URETEROSCOPY/HOLMIUM LASER/STENT PLACEMENT;  Surgeon: Francisca Redell BROCKS, MD;  Location:  [facility];  Service: Urology;  Laterality: Bilateral;   EYE SURGERY     carterat surgery x 2   FRACTURE SURGERY     both wrist ahs metal plates   JOINT REPLACEMENT     left hip, LEFT  KNEE   nasal cavity surgery     septoplasty   ORCHIECTOMY     only 1 testicle removed d/t lump on that side   port a cath place  2017   POSTERIOR LUMBAR FUSION 2 WITH HARDWARE REMOVAL Right 10/25/2023   Procedure: ARTHROSCOPY, SHOULDER WITH DEBRIDEMENT;  Surgeon: Edie Norleen PARAS, MD;  Location: [facility];  Service: Orthopedics;  Laterality: Right;   removed port a cath  2022   SHOULDER OPEN ROTATOR CUFF REPAIR Right 10/25/2023   Procedure: REPAIR, ROTATOR CUFF, OPEN;  Surgeon: Edie Norleen PARAS, MD;  Location: [facility];  Service: Orthopedics;  Laterality: Right;   SUBACROMIAL DECOMPRESSION Right 10/25/2023   Procedure: DECOMPRESSION, SUBACROMIAL SPACE;  Surgeon: Edie Norleen PARAS, MD;  Location: [facility];  Service: Orthopedics;  Laterality: Right;   TOTAL HIP ARTHROPLASTY Left    TOTAL KNEE ARTHROPLASTY Left 07/20/2020   Procedure: TOTAL KNEE ARTHROPLASTY - Medford Amber to Assist;  Surgeon: Kathlynn Sharper, MD;  Location: [facility];  Service: Orthopedics;  Laterality: Left;   TOTAL KNEE REVISION Left 11/01/2021   Procedure: Left knee revision, polyethylene exchange;  Surgeon: Kathlynn Sharper, MD;  Location: [facility];  Service: Orthopedics;  Laterality: Left;   TRACHEOSTOMY  1999   vocal cord spacer  2017   WRIST SURGERY Bilateral    PLATES AND SCREWS     FAMILY HISTORY   Family History  Problem Relation Age of Onset   Asthma Sister    Heart disease Brother      SOCIAL HISTORY   Social History   Tobacco Use   Smoking status: Former    Current packs/day: 0.00    Types: Cigarettes    Quit date: 1978    Years since quitting: 47.9   Smokeless tobacco: Former  Building Services Engineer status: Never Used  Substance Use Topics   Alcohol use: Yes    Alcohol/week: 7.0 standard drinks of alcohol    Types: 7  Glasses of wine per week    Comment: OCCASIONAL   Drug use: Not Currently     MEDICATIONS    Home Medication:    Current Medication:  Current Facility-Administered Medications:    0.9 %  sodium chloride  infusion, , Intravenous, Continuous, Mazzoni, Andrea, MD    ALLERGIES   Ancef  [cefazolin ] and Bee venom     REVIEW OF SYSTEMS    Review of Systems:  Gen:  Denies  fever, sweats, chills weigh loss  HEENT: Denies blurred vision, double vision, ear pain, eye pain, hearing loss, nose bleeds, sore throat Cardiac:  No dizziness, chest pain or heaviness, chest tightness,edema Resp:   reports dyspnea chronically  Gi: Denies swallowing difficulty, stomach pain, nausea or vomiting, diarrhea, constipation, bowel incontinence Gu:  Denies bladder incontinence, burning urine Ext:   Denies Joint pain, stiffness or swelling Skin: Denies  skin rash,  easy bruising or bleeding or hives Endoc:  Denies polyuria, polydipsia , polyphagia or weight change Psych:   Denies depression, insomnia or hallucinations   Other:  All other systems negative   VS: BP (!) 142/74   Pulse 93   Temp 98.1 F (36.7 C) (Temporal)   Resp (!) 22   Ht 6' 2 (1.88 m)   Wt 118.4 kg   SpO2 97%   BMI 33.51 kg/m      PHYSICAL EXAM    GENERAL:NAD, no fevers, chills, no weakness no fatigue HEAD: Normocephalic, atraumatic.  EYES: Pupils equal, round, reactive to light. Extraocular muscles intact. No scleral icterus.  MOUTH: Moist mucosal membrane. Dentition intact. No abscess noted.  EAR, NOSE, THROAT: Clear without exudates. No external lesions.  NECK: Supple. No thyromegaly. No nodules. No JVD.  PULMONARY: decreased breath sounds with mild rhonchi worse at bases bilaterally.  CARDIOVASCULAR: S1 and S2. Regular rate and rhythm. No murmurs, rubs, or gallops. No edema. Pedal pulses 2+ bilaterally.  GASTROINTESTINAL: Soft, nontender, nondistended. No masses. Positive bowel sounds. No hepatosplenomegaly.   MUSCULOSKELETAL: No swelling, clubbing, or edema. Range of motion full in all extremities.  NEUROLOGIC: Cranial nerves II through XII are intact. No gross focal neurological deficits. Sensation intact. Reflexes intact.  SKIN: No ulceration, lesions, rashes, or cyanosis. Skin warm and dry. Turgor intact.  PSYCHIATRIC: Mood, affect within normal limits. The patient is awake, alert and oriented x 3. Insight, judgment intact.       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 Stable enlarged right paratracheal and subcarinal lymph nodes with calcification. No new thoracic metastasis. Narrative  EXAM: CT CHEST W CONTRAST ACCESSION: [id number] UN REPORT DATE: 08/16/2023 10:27 AM  CLINICAL INDICATION: other  - C43.9 - Metastatic melanoma  TECHNIQUE: Contiguous axial images were reconstructed through the chest following a single breath hold helical acquisition during the administration of intravenous contrast material. Images were reformatted in the axial, coronal, and sagittal planes. MIP slabs were also constructed.  COMPARISON: Chest CT dated 02/19/2023  FINDINGS:  LUNGS/AIRWAYS/PLEURA: Trachea and large airways are patent. Irregular consolidation with traction bronchiectasis and architectural distortion in the paramediastinal right lung consistent with post radiation fibrosis. No new or enlarging pulmonary nodules. No pleural effusion or pneumothorax.  MEDIASTINUM/THORACIC INLET: Stable right lower paratracheal and subcarinal conglomerate enlarged lymph nodes with calcifications and measuring up to 3 cm.  HEART/VASCULATURE: Cardiac chambers are normal in size. Moderate coronary artery calcifications. Stable small pericardial effusion. Aorta is normal in caliber. Main pulmonary artery is normal in size.  UPPER ABDOMEN: Reported  separately.  CHEST WALL/BONES: No enlarged axillary lymph nodes. Extensive degenerative changes of the thoracic spine.  DEVICES: None. Procedure Note  Garlon Grammes, MD - 08/16/2023 Formatting of this note might be different from the original. EXAM: CT CHEST W CONTRAST ACCESSION: [id number] UN REPORT DATE: 08/16/2023 10:27 AM  ASSESSMENT/PLAN   Mucus plugging of tracheobronchial tree with hilar and mediastinal adenopathy -Patient is here for Bronchoscopic evaluation with airway inspection, therapeutic aspiration of tracheobronchial tree as well as bronchoalveolar lavage and endobronchial ultrasound assisted lymph node biopsies due to paratracheal and mediastinal lymphadenopathy as largest 3 cm in size.  This findings may be suggestive of ongoing malignancy versus infectious etiology versus inflammatory conditions such as sarcoidosis.  Bronchoscopy will be both therapeutic and diagnostic.  Patient seen at bedside with no  new complaints or findings. Reviewed risks/complications and benefits with patient, risks include infection, pneumothorax/pneumomediastinum which may require chest tube placement as well as overnight/prolonged hospitalization and possible mechanical ventilation. Other risks include bleeding and very rarely death.  Patient understands risks and wishes to proceed.  Additional questions were answered, and patient is aware that post procedure patient will be going home with family and may experience cough with possible clots on expectoration as well as phlegm which may last few days as well as hoarseness of voice post intubation and mechanical ventilation.              Thank you for allowing me to participate in the care of this patient.   Patient/Family are satisfied with care plan and all questions have been answered.    Provider disclosure: Patient with at least one acute or chronic illness or injury that poses a threat to life or bodily function and is being managed  actively during this encounter.  All of the below services have been performed independently by signing provider:  review of prior documentation from internal and or external health records.  Review of previous and current lab results.  Interview and comprehensive assessment during patient visit today. Review of current and previous chest radiographs/CT scans. Discussion of management and test interpretation with health care team and patient/family.   This document was prepared using Dragon voice recognition software and may include unintentional dictation errors.     Asja Frommer, M.D.  Division of Pulmonary & Critical Care Medicine

## 2024-02-16 ENCOUNTER — Encounter: Payer: Self-pay | Admitting: Pulmonary Disease

## 2024-02-17 LAB — CULTURE, BAL-QUANTITATIVE W GRAM STAIN: Culture: NO GROWTH

## 2024-02-18 ENCOUNTER — Encounter: Payer: Self-pay | Admitting: Surgery

## 2024-02-18 LAB — CYTOLOGY - NON PAP

## 2024-02-18 LAB — SURGICAL PATHOLOGY

## 2024-03-05 ENCOUNTER — Encounter: Payer: Self-pay | Admitting: Pulmonary Disease

## 2024-04-08 ENCOUNTER — Encounter: Payer: Self-pay | Admitting: Surgery

## 2024-05-26 ENCOUNTER — Ambulatory Visit: Admitting: Dermatology

## 2024-06-10 ENCOUNTER — Ambulatory Visit: Admitting: Dermatology
